# Patient Record
Sex: Male | Born: 1950 | Race: White | Hispanic: No | Marital: Married | Smoking: Former smoker
Health system: Southern US, Community
[De-identification: ages and names within clinical notes are randomized; demographics above are authoritative.]

## PROBLEM LIST (undated history)

## (undated) DIAGNOSIS — K449 Diaphragmatic hernia without obstruction or gangrene: Secondary | ICD-10-CM

## (undated) DIAGNOSIS — F32A Depression, unspecified: Secondary | ICD-10-CM

## (undated) DIAGNOSIS — C8590 Non-Hodgkin lymphoma, unspecified, unspecified site: Secondary | ICD-10-CM

## (undated) DIAGNOSIS — E119 Type 2 diabetes mellitus without complications: Secondary | ICD-10-CM

## (undated) DIAGNOSIS — I441 Atrioventricular block, second degree: Secondary | ICD-10-CM

## (undated) DIAGNOSIS — I509 Heart failure, unspecified: Secondary | ICD-10-CM

## (undated) DIAGNOSIS — F329 Major depressive disorder, single episode, unspecified: Secondary | ICD-10-CM

## (undated) DIAGNOSIS — N183 Chronic kidney disease, stage 3 unspecified: Secondary | ICD-10-CM

## (undated) DIAGNOSIS — E782 Mixed hyperlipidemia: Secondary | ICD-10-CM

## (undated) DIAGNOSIS — C61 Malignant neoplasm of prostate: Secondary | ICD-10-CM

## (undated) DIAGNOSIS — R0981 Nasal congestion: Secondary | ICD-10-CM

## (undated) DIAGNOSIS — S31109A Unspecified open wound of abdominal wall, unspecified quadrant without penetration into peritoneal cavity, initial encounter: Secondary | ICD-10-CM

## (undated) DIAGNOSIS — C801 Malignant (primary) neoplasm, unspecified: Secondary | ICD-10-CM

## (undated) DIAGNOSIS — I4892 Unspecified atrial flutter: Principal | ICD-10-CM

## (undated) DIAGNOSIS — I2510 Atherosclerotic heart disease of native coronary artery without angina pectoris: Secondary | ICD-10-CM

## (undated) DIAGNOSIS — R0989 Other specified symptoms and signs involving the circulatory and respiratory systems: Secondary | ICD-10-CM

## (undated) DIAGNOSIS — N179 Acute kidney failure, unspecified: Secondary | ICD-10-CM

## (undated) DIAGNOSIS — I4891 Unspecified atrial fibrillation: Secondary | ICD-10-CM

## (undated) DIAGNOSIS — C679 Malignant neoplasm of bladder, unspecified: Secondary | ICD-10-CM

## (undated) DIAGNOSIS — N289 Disorder of kidney and ureter, unspecified: Secondary | ICD-10-CM

## (undated) DIAGNOSIS — K439 Ventral hernia without obstruction or gangrene: Secondary | ICD-10-CM

## (undated) DIAGNOSIS — F419 Anxiety disorder, unspecified: Secondary | ICD-10-CM

## (undated) HISTORY — PX: CARDIAC CATHETERIZATION: SHX172

## (undated) HISTORY — DX: Other specified symptoms and signs involving the circulatory and respiratory systems: R09.89

## (undated) HISTORY — DX: Atrioventricular block, second degree: I44.1

## (undated) HISTORY — PX: PROSTATE SURGERY: SHX751

## (undated) HISTORY — DX: Unspecified atrial fibrillation: I48.91

## (undated) HISTORY — DX: Unspecified atrial flutter: I48.92

## (undated) HISTORY — PX: KIDNEY SURGERY: SHX687

## (undated) HISTORY — DX: Mixed hyperlipidemia: E78.2

## (undated) HISTORY — PX: BLADDER SURGERY: SHX569

## (undated) HISTORY — PX: PORTACATH PLACEMENT: SHX2246

## (undated) HISTORY — PX: CHOLECYSTECTOMY: SHX55

## (undated) HISTORY — DX: Malignant neoplasm of bladder, unspecified: C67.9

## (undated) HISTORY — DX: Malignant neoplasm of prostate: C61

## (undated) HISTORY — DX: Ventral hernia without obstruction or gangrene: K43.9

## (undated) HISTORY — DX: Non-Hodgkin lymphoma, unspecified, unspecified site: C85.90

## (undated) HISTORY — DX: Type 2 diabetes mellitus without complications: E11.9

## (undated) HISTORY — PX: HERNIA REPAIR: SHX51

## (undated) HISTORY — PX: CORONARY ARTERY BYPASS GRAFT: SHX141

## (undated) HISTORY — PX: VEIN SURGERY: SHX48

---

## 2000-07-19 ENCOUNTER — Encounter: Admission: RE | Admit: 2000-07-19 | Discharge: 2000-07-21 | Payer: Self-pay | Admitting: *Deleted

## 2004-09-03 ENCOUNTER — Ambulatory Visit: Payer: Self-pay | Admitting: Cardiology

## 2006-04-15 ENCOUNTER — Ambulatory Visit (HOSPITAL_COMMUNITY): Admission: RE | Admit: 2006-04-15 | Discharge: 2006-04-15 | Payer: Self-pay | Admitting: Family Medicine

## 2006-07-06 ENCOUNTER — Ambulatory Visit: Payer: Self-pay | Admitting: Gastroenterology

## 2006-07-09 ENCOUNTER — Inpatient Hospital Stay (HOSPITAL_COMMUNITY): Admission: EM | Admit: 2006-07-09 | Discharge: 2006-07-20 | Payer: Self-pay | Admitting: Internal Medicine

## 2006-07-09 ENCOUNTER — Ambulatory Visit: Payer: Self-pay | Admitting: *Deleted

## 2006-07-11 ENCOUNTER — Encounter: Payer: Self-pay | Admitting: Vascular Surgery

## 2006-07-27 ENCOUNTER — Ambulatory Visit: Payer: Self-pay | Admitting: Cardiovascular Disease

## 2006-07-27 ENCOUNTER — Ambulatory Visit (HOSPITAL_COMMUNITY): Admission: RE | Admit: 2006-07-27 | Discharge: 2006-07-27 | Payer: Self-pay | Admitting: Cardiovascular Disease

## 2006-08-06 ENCOUNTER — Emergency Department (HOSPITAL_COMMUNITY): Admission: EM | Admit: 2006-08-06 | Discharge: 2006-08-06 | Payer: Self-pay | Admitting: Emergency Medicine

## 2006-08-09 ENCOUNTER — Inpatient Hospital Stay (HOSPITAL_COMMUNITY): Admission: AD | Admit: 2006-08-09 | Discharge: 2006-08-16 | Payer: Self-pay | Admitting: Surgery

## 2006-08-12 ENCOUNTER — Ambulatory Visit: Payer: Self-pay | Admitting: Surgery

## 2006-08-30 ENCOUNTER — Ambulatory Visit: Payer: Self-pay | Admitting: Surgery

## 2006-08-30 ENCOUNTER — Encounter: Admission: RE | Admit: 2006-08-30 | Discharge: 2006-08-30 | Payer: Self-pay | Admitting: Surgery

## 2006-09-09 ENCOUNTER — Ambulatory Visit: Payer: Self-pay | Admitting: Cardiothoracic Surgery

## 2006-09-20 ENCOUNTER — Ambulatory Visit: Payer: Self-pay | Admitting: Cardiovascular Disease

## 2006-09-27 ENCOUNTER — Ambulatory Visit: Payer: Self-pay | Admitting: Cardiothoracic Surgery

## 2006-10-04 ENCOUNTER — Ambulatory Visit: Payer: Self-pay | Admitting: Surgery

## 2006-10-25 ENCOUNTER — Ambulatory Visit: Payer: Self-pay | Admitting: Surgery

## 2007-08-30 ENCOUNTER — Encounter (HOSPITAL_BASED_OUTPATIENT_CLINIC_OR_DEPARTMENT_OTHER): Payer: Self-pay | Admitting: Internal Medicine

## 2007-11-28 ENCOUNTER — Ambulatory Visit: Payer: Self-pay | Admitting: Surgery

## 2007-11-29 ENCOUNTER — Ambulatory Visit: Payer: Self-pay | Admitting: Cardiovascular Disease

## 2007-11-30 ENCOUNTER — Ambulatory Visit: Payer: Self-pay | Admitting: Cardiovascular Disease

## 2007-11-30 ENCOUNTER — Ambulatory Visit: Payer: Self-pay

## 2007-12-05 ENCOUNTER — Ambulatory Visit: Payer: Self-pay

## 2007-12-29 ENCOUNTER — Encounter: Admission: RE | Admit: 2007-12-29 | Discharge: 2007-12-29 | Payer: Self-pay | Admitting: General Surgery

## 2008-01-08 ENCOUNTER — Ambulatory Visit: Payer: Self-pay | Admitting: Cardiovascular Disease

## 2008-03-05 ENCOUNTER — Inpatient Hospital Stay (HOSPITAL_COMMUNITY): Admission: RE | Admit: 2008-03-05 | Discharge: 2008-03-07 | Payer: Self-pay | Admitting: General Surgery

## 2008-10-22 ENCOUNTER — Encounter: Payer: Self-pay | Admitting: Cardiovascular Disease

## 2008-10-22 ENCOUNTER — Ambulatory Visit: Payer: Self-pay | Admitting: Cardiovascular Disease

## 2008-10-22 DIAGNOSIS — I441 Atrioventricular block, second degree: Secondary | ICD-10-CM | POA: Insufficient documentation

## 2008-10-22 DIAGNOSIS — E785 Hyperlipidemia, unspecified: Secondary | ICD-10-CM

## 2008-10-22 DIAGNOSIS — K439 Ventral hernia without obstruction or gangrene: Secondary | ICD-10-CM | POA: Insufficient documentation

## 2008-10-22 DIAGNOSIS — E119 Type 2 diabetes mellitus without complications: Secondary | ICD-10-CM | POA: Insufficient documentation

## 2008-10-22 DIAGNOSIS — R0989 Other specified symptoms and signs involving the circulatory and respiratory systems: Secondary | ICD-10-CM | POA: Insufficient documentation

## 2008-10-22 DIAGNOSIS — Z951 Presence of aortocoronary bypass graft: Secondary | ICD-10-CM

## 2008-10-22 DIAGNOSIS — F17200 Nicotine dependence, unspecified, uncomplicated: Secondary | ICD-10-CM

## 2008-11-08 ENCOUNTER — Encounter: Payer: Self-pay | Admitting: Cardiovascular Disease

## 2008-11-08 ENCOUNTER — Ambulatory Visit: Payer: Self-pay

## 2009-03-20 ENCOUNTER — Encounter (INDEPENDENT_AMBULATORY_CARE_PROVIDER_SITE_OTHER): Payer: Self-pay | Admitting: *Deleted

## 2009-04-24 ENCOUNTER — Ambulatory Visit: Payer: Self-pay | Admitting: Gastroenterology

## 2009-05-01 ENCOUNTER — Telehealth: Payer: Self-pay | Admitting: Gastroenterology

## 2009-05-07 ENCOUNTER — Ambulatory Visit: Payer: Self-pay | Admitting: Gastroenterology

## 2009-09-04 ENCOUNTER — Telehealth (INDEPENDENT_AMBULATORY_CARE_PROVIDER_SITE_OTHER): Payer: Self-pay | Admitting: *Deleted

## 2009-12-10 ENCOUNTER — Ambulatory Visit: Payer: Self-pay | Admitting: Vascular Surgery

## 2009-12-15 ENCOUNTER — Ambulatory Visit: Payer: Self-pay | Admitting: Vascular Surgery

## 2010-05-20 ENCOUNTER — Ambulatory Visit: Payer: Self-pay | Admitting: Vascular Surgery

## 2010-07-03 ENCOUNTER — Encounter: Payer: Self-pay | Admitting: Physician Assistant

## 2010-07-03 ENCOUNTER — Ambulatory Visit: Payer: Self-pay | Admitting: Cardiology

## 2010-08-11 NOTE — Assessment & Plan Note (Signed)
Summary: [facility]   CC:  no complaints folllow up.  History of Present Illness: Frederick Foster returns today for followup.he is a previous patient of Dr. Samule Ohm.  He had coronary bypass surgery by Dr. Lavinia Sharps in 2009.  this was complicated by sternal wound infection.  He just had his ventral hernia repair by Dr. Carolynne Edouard.  He is still bothered by it.  His current disability.  He used to work as a Glass blower/designer.  he has not had any significant chest pain.  He is seen Westen rocking in family practice for cardiac rehabilitation-type issues.  He has had his Lipitor stopped due to muscle pains he continues to smoke a pack a day or so.  He has been on Chantix in the past because nightmares.  I counseled him for less than 10 minutes regarding smoking cessation.  He needs a followup carotid duplex her right bruit.  Current Problems (verified): None  Current Medications (verified): 1)  Aspirin Ec 325 Mg Tbec (Aspirin) .... Take One Tablet By Mouth Daily 2)  Pravastatin Sodium 40 Mg Tabs (Pravastatin Sodium) .Marland Kitchen.. 1 Tab By Mouth Once Daily 3)  Cardizem Cd 240 Mg Xr24h-Cap (Diltiazem Hcl Coated Beads) .Marland Kitchen.. 1 Tab By Mouth Once Daily 4)  Xanax 25mg  .... As Needed 5)  Lisionpril .Marland Kitchen.. 1 Tab By Mouth Once Daily 6)  Citalopram .... 1 Tab By Mouth Once Daily  Allergies (verified): 1)  ! Pcn 2)  ! Sulfa  Family History:    non-contributory  Social History:    Married    On Disability    Use to work as a Biochemist, clinical    Non-drinker  Review of Systems       Denies fever, malais, weight loss, blurry vision, decreased visual acuity, cough, sputum, SOB, hemoptysis, pleuritic pain, palpitaitons, heartburn, abdominal pain, melena, lower extremity edema, claudication, or rash. Muscle aches and pains ? secondary to statin  Vital Signs:  Patient profile:   60 year old male Height:      72 inches Weight:      231 pounds Pulse rate:   93 / minute Resp:     12 per minute BP sitting:   146 / 88  (left  arm)  Vitals Entered By: Kem Parkinson (October 22, 2008 8:40 AM)  Physical Exam  General:  Affect appropriate Healthy:  appears stated age HEENT: normal Neck supple with no adenopathy JVP normal Right  bruits no thyromegaly Lungs clear with no wheezing and good diaphragmatic motion Heart:  S1/S2 no murmur,rub, gallop or click PMI normal Abdomen: benighn, BS positve, no tenderness, no AAA no bruit.  No HSM or HJR Distal pulses intact with no bruits No edema Neuro non-focal Skin warm and dry Previous sternal woulnd infection Left radial arm scar   Impression & Recommendations:  Problem # 1:  CORONARY ARTERY BYPASS GRAFT, TWO VESSEL, HX OF (ICD-V45.81) Stable no angina continue ASA  Problem # 2:  SMOKER (ICD-305.1) F/U primary M.D> consider patch or Welbutrin.  Counseled for less than 10 minutes  Problem # 3:  AODM (ICD-250.00) Target A1c 6.5 or less His updated medication list for this problem includes:    Aspirin Ec 325 Mg Tbec (Aspirin) .Marland Kitchen... Take one tablet by mouth daily    Januvia 100 Mg Tabs (Sitagliptin phosphate) .Marland Kitchen... 1 tab by mouth once daily    Avandamet 10-998 Mg Tabs (Rosiglitazone-metformin) .Marland Kitchen... 1 tab by mouth two times a day  Problem # 4:  CAROTID BRUIT (ICD-785.9)  F/U duplex No high grade lesion pre CABG  Problem # 5:  MIXED HYPERLIPIDEMIA (ICD-272.2) Lipitor stopped due to muscle pain.  Primary to follow CPD His updated medication list for this problem includes:    Pravastatin Sodium 40 Mg Tabs (Pravastatin sodium) .Marland Kitchen... 1 tab by mouth once daily  Problem # 6:  HERNIA, VENTRAL (ICD-553.20) F/U Dr/ Carolynne Edouard.  Still bothering patient but no intestinal obstruction  Patient Instructions: 1)  Carotid Duplex 2)  F/U Dr. Eden Emms 6 months

## 2010-08-11 NOTE — Progress Notes (Signed)
  Recieved request for Records from [employer] forwarded to [facility] for processing [facility]  September 04, 2009 9:06 AM    Appended Document:  Recieved request from [employer] forwarded to [facility] for processing'  Appended Document:  Recieved a 3rd request from [employer] for Records forwarded to [facility] for processing.Frederick Foster

## 2010-08-13 NOTE — Assessment & Plan Note (Signed)
Summary: sur/removal left kidney on 07/09/10   Visit Type:  surgical clearance  CC:  no complaints.  History of Present Illness: Primary Cardiologist:  Dr. Charlton Haws  Frederick Foster is a 60 -year-old male with a history of CAD, status post CABG in 2008 which was complicated by a sternal wound infection, diabetes, hyperlipidemia.  He has a history of 21 AV block in the setting of Cardizem and Metoprolol which have been held.  He needs a left nephrectomy and presents for preoperative evaluation.  He has renal carcinoma.  His surgery is set for December 29.  His surgeon is Dr. Beatris Ship in [place].  He denies chest pain, shortness of breath, syncope.  He denies palpitations.  He denies orthopnea, PND or pedal edema.  He has a shop at his home and he walks to it several times a day to do work.  He has to walk up and down a large hill.  He denies chest symptoms with this.  He can vacuum his whole house without chest symptoms or shortness of breath.  Problems Prior to Update: 1)  Carotid Bruit  (ICD-785.9) 2)  Hernia, Ventral  (ICD-553.20) 3)  Aodm  (ICD-250.00) 4)  Smoker  (ICD-305.1) 5)  Mixed Hyperlipidemia  (ICD-272.2) 6)  Av Block  (ICD-426.9) 7)  Coronary Artery Bypass Graft, Two Vessel, Hx of  (ICD-V45.81)  Current Medications (verified): 1)  Aspirin Ec 325 Mg Tbec (Aspirin) .... (Hold)    Take One Tablet By Mouth Daily 2)  Pravastatin Sodium 40 Mg Tabs (Pravastatin Sodium) .Marland Kitchen.. 1 Tab By Mouth Once Daily 3)  Cardizem Cd 240 Mg Xr24h-Cap (Diltiazem Hcl Coated Beads) .Marland Kitchen.. 1 Tab By Mouth Once Daily 4)  Xanax 25mg  .... As Needed 5)  Lisionpril .Marland Kitchen.. 1 Tab By Mouth Once Daily 6)  Janumet 50-1000 Mg Tabs (Sitagliptin-Metformin Hcl) .Marland Kitchen.. 1 Tablet Two Times A Day 7)  Lantus Solostar 100 Unit/ml Soln (Insulin Glargine) .... 30 Units At Bedtime  Allergies (verified): 1)  ! Pcn 2)  ! Sulfa  Past History:  Past Medical History: Last updated: 10/22/2008 CABG: [place] 07/2006 Median  sternotomy, extracorporeal circulation,  coronary bypass graft surgery x3 using a left internal mammary artery  graft to the obtuse marginal branch of the left circumflex coronary  artery, and a left radial artery graft to the posterior descending  branch of the right coronary, and a right internal mammary artery graft  to the left anterior descending coronary artery. 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 . . plans to quit soon Non-drinker  Review of Systems       He has had hematuria.  Otherwise, as per  the HPI.  All other systems reviewed and negative.   Vital Signs:  Patient profile:   60 year old male Height:      72 inches Weight:      231.50 pounds BMI:     31.51 Pulse rate:   88 / minute BP sitting:   142 / 79  (left arm) Cuff size:   regular  Vitals Entered By: Caralee Ates CMA (July 03, 2010 12:41 PM)  Physical Exam  General:  Well nourished, well developed, in no acute distress HEENT: normal Neck: no JVD Cardiac:  normal S1, S2; RRR; no murmur Lungs:  clear to auscultation bilaterally, no wheezing, rhonchi or rales Abd: soft, nontender,  no hepatomegaly Ext: no clubbing, cyanosis or edema Endo: no thyromegaly Skin: warm and dry Neuro:  CNs 2-12 intact, no focal abnormalities noted    EKG  Procedure date:  07/03/2010  Findings:      Normal Sinus Rhythm Heart rate 76 Normal axis Nonconducted PAC Nonspecific interventricular conduction delay No ischemic changes no old tracing to compare to  Impression & Recommendations:  Problem # 1:  PRE-OPERATIVE CARDIOVASCULAR EXAMINATION (ICD-V72.81)  He does not have any unstable cardiac conditions.  He had revascularization surgery in 2008.  He had a nonischemic Myoview study in 2009.  He is able to achieve well over 4 minutes  without chest pain or shortness of breath.  His rhythm is stable on EKG.  He denies any symptoms of heart failure.  According to ACC/AHA guidelines, he requires no further cardiac workup prior to his noncardiac surgery.  He should be at acceptable risk.  Our team in [place] will be available in the perioperative period as necessary.  He should resume aspirin once it is deemed to be safe post operatively.  He has been off of beta blocker therapy since he developed 2:1 heart block 2 years ago.  I would not recommend restarting at this time.  Problem # 2:  CORONARY ARTERY BYPASS GRAFT, TWO VESSEL, HX OF (ICD-V45.81) Stable.  He denies angina.  He should resume aspirin postoperatively once felt to be stable.  Problem # 3:  CAROTID BRUIT (ICD-785.9) Carotid Dopplers done in April 2010 demonstrated 0-39% bilateral ICA stenosis.  He requires followup in 2012.  Problem # 4:  MIXED HYPERLIPIDEMIA (ICD-272.2) This is followed by his PCP.  His updated medication list for this problem includes:    Pravastatin Sodium 40 Mg Tabs (Pravastatin sodium) .Marland Kitchen... 1 tab by mouth once daily  Other Orders: EKG w/ Interpretation (93000)  Patient Instructions: 1)  Your physician recommends that you schedule a follow-up appointment in: 6 MONTHS WITH DR. Eden Emms. 2)  Your physician recommends that you continue on your current medications as directed. Please refer to the Current Medication list given to you today.

## 2010-09-02 ENCOUNTER — Other Ambulatory Visit (INDEPENDENT_AMBULATORY_CARE_PROVIDER_SITE_OTHER): Payer: Medicare Other | Admitting: Vascular Surgery

## 2010-09-02 DIAGNOSIS — I83893 Varicose veins of bilateral lower extremities with other complications: Secondary | ICD-10-CM

## 2010-09-03 NOTE — Assessment & Plan Note (Signed)
OFFICE VISIT  [name], Frederick Foster DOB:  1951-02-15                                       09/02/2010 CHART#:[id number]  The patient presents today for laser ablation of left great saphenous vein from the level of the knee up to just below the saphenofemoral junction.  This was done under tumescent anesthesia in our office.  He had no immediate complications and was discharged to home, and he will be seen again in 1 week for repeat duplex followup.    Larina Earthly, M.D. Electronically Signed  TFE/MEDQ  D:  09/02/2010  T:  09/03/2010  Job:  [id number]

## 2010-09-09 ENCOUNTER — Ambulatory Visit (INDEPENDENT_AMBULATORY_CARE_PROVIDER_SITE_OTHER): Payer: Medicare Other | Admitting: Vascular Surgery

## 2010-09-09 ENCOUNTER — Encounter (INDEPENDENT_AMBULATORY_CARE_PROVIDER_SITE_OTHER): Payer: Medicare Other

## 2010-09-09 DIAGNOSIS — I83893 Varicose veins of bilateral lower extremities with other complications: Secondary | ICD-10-CM

## 2010-09-09 DIAGNOSIS — Z48812 Encounter for surgical aftercare following surgery on the circulatory system: Secondary | ICD-10-CM

## 2010-09-10 NOTE — Assessment & Plan Note (Signed)
OFFICE VISIT  Frederick Foster, Frederick Foster DOB:  01-09-1951                                       09/09/2010 CHART#:[id number]  The patient presents today for a 1-week follow-up after laser ablation of his left great saphenous vein.  He had no significant difficulty, minimal bruising and mild tenderness.  He does have a palpable saphenous vein in the subcutaneous tissue to his medial thigh.  He does have decompression and very extreme varicosity ver his anterior knee, thigh and calf area.  He underwent repeat venous duplex which shows closure of his great saphenous vein and no evidence of DVT.  He will continue compression garment usage.  We will see him again in 3 months to determine if he has had successful decompression and treatment of his large varicosities.  He understands that he may require a staged second procedure if he continues to have difficulty despite ablation of his great saphenous vein.    Larina Earthly, M.D. Electronically Signed  TFE/MEDQ  D:  09/09/2010  T:  09/10/2010  Job:  [id number]

## 2010-09-15 NOTE — Procedures (Unsigned)
DUPLEX DEEP VENOUS EXAM - LOWER EXTREMITY  INDICATION:  Status post left greater saphenous vein ablation.  HISTORY:  Edema:  No. Trauma/Surgery:  Yes. Pain:  Yes. PE:  No. Previous DVT:  No. Anticoagulants:  No. Other:  DUPLEX EXAM:               CFV   SFV   PopV  PTV    GSV               R  L  R  L  R  L  R   L  R  L Thrombosis    o  o     o     o      o     + Spontaneous   +  +     +     +      +     o Phasic        +  +     +     +      +     o Augmentation  +  +     +     +      +     o Compressible  +  +     +     +      +     o Competent     +  +     +     +      +     +  Legend:  + - yes  o - no  p - partial  D - decreased  IMPRESSION: 1. No evidence of acute deep venous thrombosis within the left lower     extremity. 2. Successful left greater saphenous vein ablation from the     saphenofemoral junction to the distal insertion site.   _____________________________ Larina Earthly, M.D.  OD/MEDQ  D:  09/09/2010  T:  09/09/2010  Job:  [id number]

## 2010-10-15 LAB — GLUCOSE, CAPILLARY
Glucose-Capillary: 185 mg/dL — ABNORMAL HIGH (ref 70–99)
Glucose-Capillary: 188 mg/dL — ABNORMAL HIGH (ref 70–99)

## 2010-11-24 NOTE — Procedures (Signed)
[facility]                              EXERCISE TREADMILL   KIZER, NOBBE                      MRN:          [id number]  DATE:11/30/2007                            DOB:          04/04/1951    A 56-year patient with previous history of CABG showed up to the office  with 2:1 AV block on Cardizem and Toprol.  Medicines were held.  Treadmill test was done to assess chronotropic incompetence and adequacy  of sinus and AV node function.   The patient exercised for 4 minutes, the equivalent of 2 minutes into  stage II of the Bruce protocol.  Exercise was stopped due to fatigue and  shortness of breath.  Heart rate increased from 90-151.   The patient's resting EKG was normal at this time.  There was no AV  block, no first-degree block.   With stress, he did not drop any beats.  There were no significant  arrhythmias.  He had an occasional PVC.  Baseline EKG had nonspecific ST-  T wave changes and was thought nondiagnostic in regards to ischemia.   IMPRESSION:  Resolution of 2:1 block.  The patient will have his  Cardizem and Toprol stopped.  Given the fact that he is a diabetic with  hypertension, we will start him on 10 of lisinopril.  He still needs  further evaluation in regards to his previous CABG.  Particularly since  he is smoking and has had a sternal wound infection, I am worried about  early graft failure.  He will follow up with a dobutamine or adenosine  Myoview study for this.   There are currently no indication for pacemaker therapy.     Noralyn Pick. Eden Emms, MD, [facility]  Electronically Signed    PCN/MedQ  DD: 11/30/2007  DT: 11/30/2007  Job #: [id number]

## 2010-11-24 NOTE — Op Note (Signed)
NAMETHOMAS, Foster               ACCOUNT NO.:  [id number]   MEDICAL RECORD NO.:  [id number]          PATIENT TYPE:  INP   LOCATION:  5151                         FACILITY:  [facility]   PHYSICIAN:  Ollen Gross. Vernell Morgans, M.D. DATE OF BIRTH:  1951/03/01   DATE OF PROCEDURE:  03/05/2008  DATE OF DISCHARGE:                               OPERATIVE REPORT   PREOPERATIVE DIAGNOSIS:  Ventral hernia.   POSTOPERATIVE DIAGNOSIS:  Ventral hernia.   PROCEDURES:  Ventral hernia repair with mesh.   SURGEON:  Ollen Gross. Vernell Morgans, MD   ASSISTANT:  Dr. Donell Beers   ANESTHESIA:  General endotracheal.   PROCEDURE:  After informed consent was obtained, the patient was brought  to the operating room and placed in supine position on the operating  room table.  After adequate induction of general anesthesia, the  patient's abdomen was prepped with Betadine and draped in usual sterile  manner.  The patient had an upper epigastric ventral hernia after a  coronary artery bypass grafting procedure at upper midline.  An incision  was made with 10-blade knife.  This incision was carried down through  the skin and subcutaneous tissue sharply with the electrocautery until  the linea alba was identified.  The linea alba was incised with the  electrocautery.  The preperitoneal space was then dissected bluntly with  a hemostat until the peritoneum was identified.  The peritoneum was  opened with the electrocautery, gaining access to the abdominal cavity.  The rest of the incision was then opened under direct vision.  There  were no adhesions to the upper abdomen.  The falciform ligament was  taken down by clamping it proximally and distally with Kelly clamp,  divided and ligated with 2-0 silk ties.  The hernia sac itself was  excised sharply with the electrocautery.  Once this was accomplished,  the fascial edges were identified and appeared to be healthy.  A 10 x 15  piece of Proceed mesh was then chosen.  The mesh was  oriented with the  blue side towards the ceiling.  The mesh was placed in the abdominal  cavity beneath the hernia defect and wound.  The mesh was then anchored  to the inside of the abdominal wall with multiple interrupted #1 Novofil  stitches that were brought through the fascia of the abdominal wall then  down through the mesh and then back up through the abdominal wall again.  This was done circumferentially, so there were no real gaps in the mesh.  Once in place, care was taken to make sure there were no visceral  contents stuck above any edge of the mesh.  Each of the stitches were  cinched down and tied.  Once this was accomplished, the mesh was in good  position completely covering the fascial defect with good overlap.  Superiorly, the xiphoid process had been split by the CABG and part of  the xiphoid process was sticking out anteriorly.  This was shaved off  with the electrocautery.  The wound was irrigated with copious amounts  of saline.  The fascia was then  closed over the mesh with interrupted #1  Novofil figure-of-eight stitches.  Superiorly, the mesh would not  completely come together near the xiphoid, but some of the subcutaneous  fatty tissue was able to be closed over this area of the defect.  The  wound was then irrigated with more saline.  The subcutaneous tissue was  then closed with a running 2-0 Vicryl stitch anchoring it down to the  fascia of the abdominal wall as well.  The skin  was then closed with staples.  Betadine ointment and sterile dressings  were applied.  The patient tolerated procedure well.  At the end of the  case, all needle, sponge, and instrument counts were correct.  The  patient was then awakened and taken to recovery room in stable  condition.       Ollen Gross. Vernell Morgans, M.D.  Electronically Signed     PST/MEDQ  D:  03/05/2008  T:  03/06/2008  Job:  [id number]

## 2010-11-24 NOTE — Assessment & Plan Note (Signed)
[facility]                            CARDIOLOGY OFFICE NOTE   Frederick Foster, Frederick Foster                      MRN:          [id number]  DATE:01/08/2008                            DOB:          Jun 29, 1951    Frederick Foster returns today for followup.   I had initially seen him in May 2009 at which point I had picked him up  as a new patient from Dr. Samule Ohm.  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   He needed a preop clearance for an abdominal hernia surgery.  Apparently, this is going to be done by Dr. Carolynne Edouard now.  When I saw him on  Dec 07, 2007, he had asymptomatic heart block, which was 3:1 with a  residual heart rate of 52.   As indicated, this was asymptomatic.  We stopped his Lopressor and  Cardizem.  He had a stress Myoview study.  He had an exercise  electrocardiogram, which showed no heart block, a week later with good  heart rate response.  Subsequently, he had a Myoview study, which showed  no evidence of ischemia and an EF of 54%.  I told Frederick Foster that I would  clear him for surgery.  His heart block was asymptomatic and seems to  have resolved of calcium blockers.  He is now only on metoprolol 25  b.i.d.  He is not having any symptoms of syncope.  His Myoview was low-  risk.  He is very anxious about his money.  He has not worked in a  month.  He needs to get some disability forms regarding his hernia,  since it is quite painful.  I told him as far as I was concerned, he  could schedule the surgery any time and we would like to follow his  rhythm in the periop phase.   PHYSICAL EXAMINATION:  VITAL SIGNS:  Today, his heart rate was actually  elevated at 89 and sinus rhythm, blood pressure is 140/80, weight 224,  afebrile, and respiratory rate 14.  HEENT:  Unremarkable.  Carotids normal without bruit.  No  lymphadenopathy, thyromegaly, or JVP elevation.  LUNGS:  Clear  diaphragmatic motion.  No wheezing.  S1 and S2.  Normal  heart sounds.  Sternum is healed.  However, he has a large ventral  hernia just below the manubrium.  ABDOMEN:  Benign.  Bowel sounds positive.  No AAA.  No tenderness.  No  bruit.  No hepatosplenomegaly.  No hepatojugular reflux.  EXTREMITIES:  Distal pulses are intact.  No edema.  NEURO:  Nonfocal.  SKIN:  Warm and dry.  No muscular weakness.   EKG shows sinus rhythm at a rate of 94, PR interval is 154 milliseconds,  QRS is 90 milliseconds, and QT interval was 340.  He has left axis  deviation with a previous inferior wall MI.   As indicated, his Myoview was reviewed. It is low-risk with probable  previous small inferior wall infarction, no ischemia, EF 54%.   IMPRESSION:  1. Clear for hernia surgery  with Dr. Carolynne Edouard. Continue to hold calcium      blocker and do not increase beta-blocker in the future until      cleared by me given his asymptomatic heart block.  2. Coronary disease, low-risk Myoview, not having chest pain.      Continue aspirin therapy with low-dose beta blocker.  3. Diabetes, will likely need sliding-scale and medical consultation      to follow his diabetes.  The patient does not follow it at home      since it is too expensive.  He will continue his Avandamet.  4. History of hyperlipidemia in the setting of coronary artery      disease.  Continue Lipitor 80 a day, lipid and liver profile in 6      months.  5. We will follow the patient in the periop period and then I will see      him in 6 months after hospital discharge.     Noralyn Pick. Eden Emms, MD, [facility]  Electronically Signed    PCN/MedQ  DD: 01/08/2008  DT: 01/09/2008  Job #: [id number]   cc:   Dr. Carolynne Edouard

## 2010-11-24 NOTE — Consult Note (Signed)
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  He is an active healthy 60-  year-old gentleman who reports the onset of this around the time of  trauma to his left leg in the mid 1980s.  He has had marked progression  over this period of time and has extensive very large varicosities from  his thigh onto his knee area and calf onto his ankles.  He denies any  history of deep venous thrombosis or bleeding.  He reports he is having  increasingly severe discomfort related to this both specifically over  these large varicosities themselves which appear longstanding and also  with just generalized aching sensation after prolonged standing.  He has  not worn compression garments.  He does report some relief with  elevation.   PAST MEDICAL HISTORY:  Significant for hypertension, insulin dependent  for many years.  He did have a prior myocardial infarction in 2008 and  underwent coronary artery bypass graft with radial artery harvest at  that time.   FAMILY HISTORY:  Negative for premature atherosclerotic disease.   SOCIAL HISTORY:  He is married with 4 children.  He is disabled.  He  does smoke 1 pack of cigarettes per day.  He does not drink alcohol on a  regular basis.   REVIEW OF SYSTEMS:  Positive for no weight loss or weight gain.  He  reports that his weight is 235 pounds.  He is 6 feet 1 inch tall.  Aside from the prior  cardiac issue denies any GI, pulmonary, or GU  difficulty.  He does report pain in his legs with walking.  Neurologic, musculoskeletal, psychiatric, hematologic and skin are  negative.   PHYSICAL EXAMINATION:  Well developed, well nourished gentleman  appearing his stated age of 71.  Vital signs:  His blood pressure is  150/89, pulse 92, respirations  18.  He is in no acute distress.  HEENT:  Normal.  Abdomen:  Soft, nontender.  No masses.  Musculoskeletal:  Shows  no major deformities or cyanosis.  He does have very large varicosities  extending from his medial thigh in the midportion across the anterior  knee and extensively throughout his calf on the medial and lateral  aspect.  Neurologic:  No focal weakness or paresthesias.  Skin:  No  ulcers or rashes.  His pulse status is normal with 2+ dorsalis pedis and  2+ radial pulses bilaterally.   He underwent noninvasive vascular studies in our office and I have  reviewed this with him.  This shows marked reflux throughout his left  great saphenous vein.  He has no evidence of reflux in the small  saphenous vein.  He has very large varicosities arising off the  saphenous vein which is incompetent.  I discussed the significance of  this with the patient.  I explained that since he has mild insufficiency  in the deep system that he should have excellent outcome  with ablation  of his saphenous vein and tributary and phlebectomy of his multiple  large varicosities.  He has not worn compression garments.  We have  fitted him today for thigh  high 20 and 30 mmHg compression garments and  instructed him on his daily use of these.  I also explained the  importance of elevation when possible and ibuprofen for discomfort.  We  will see him again in 3 months for continued discussion.  I do feel that  he is an outstanding candidate for ablation of the greater saphenous  vein and stab phlebectomy should he fail conservative treatment.     Larina Earthly, M.Foster.  Electronically Signed   TFE/MEDQ  Foster:  12/10/2009  T:  12/11/2009  Job:  [id number]   cc:   Ernestina Penna, M.Foster.

## 2010-11-24 NOTE — Procedures (Signed)
LOWER EXTREMITY VENOUS REFLUX EXAM   INDICATION:  Bulging varicose veins.   EXAM:  Using color-flow imaging and pulse Doppler spectral analysis, the  left common femoral, superficial femoral, popliteal, posterior tibial,  greater and lesser saphenous veins are evaluated.  There is no evidence  suggesting deep venous insufficiency in the left lower extremity.   The left saphenofemoral junction is not competent with reflux of >500  milliseconds. The left GSV is not competent with reflux of >500  milliseconds with the caliber as described below.   The left proximal short saphenous vein demonstrates competency.   GSV Diameter (used if found to be incompetent only)                                            Right    Left  Proximal Greater Saphenous Vein           cm       0.88 cm  Proximal-to-mid-thigh                     cm       0.78 cm  Mid thigh                                 cm       0.71 cm  Mid-distal thigh                          cm       0.65 cm  Distal thigh                              cm       0.51 cm  Knee                                      cm       0.35 cm    IMPRESSION:  1. Left greater saphenous vein reflux with >500 milliseconds is      identified with the caliber ranging from 0.88 cm to 0.35 cm knee to      groin.  2. The left greater saphenous vein is not aneurysmal.  3. The left greater saphenous vein is not tortuous.  4. The deep venous system is competent.  5. The left lesser saphenous vein is competent.        ___________________________________________  Larina Earthly, M.D.   NT/MEDQ  D:  12/10/2009  T:  12/10/2009  Job:  [id number]

## 2010-11-24 NOTE — Assessment & Plan Note (Signed)
OFFICE VISIT   Frederick Foster, Frederick Foster  DOB:  [birth year]                                        Nov 28, 2007  CHART #:  [id number]   The patient returned today for followup of a ventral incisional hernia  at the lower end of his sternotomy incision and the epigastrium.  To  summarize, he is a 60 year old gentleman who underwent coronary bypass  x3 on 07/15/2006.  He had bilateral internal mammary grafts and a left  radial artery graft due to having no saphenous vein grafts from severe  lower extremity varicose vein disease.  His postoperative course was  complicated by development of a methicillin-sensitive Staph aureus wound  infection at the lower end of his sternotomy incision.  This was within  the superficial subcutaneous tissue and was treated with drainage and  wound VAC.  This resolved fairly rapidly.  I last saw him back on  10/25/2006 at which time the wound was completely healed but he did have  a small ventral incisional hernia just to the right side of the lower  end of his chest incision.  This was small and measured approximately 1  to 2 cm in diameter.  It was easily reducible.  This was not causing any  symptoms and we elected to continue following this.  He has not returned  over the past year but now returns reporting marked enlargement of the  hernia.  He has continued to work at his job which is fairly strenuous.  He said whenever he is working that there is a large amount of pressure  on the ventral hernia and he is having significant discomfort from that.  He does have to strain a lot at work and does a lot of heavy lifting.  He denies any chest pain or pressure.  He has had no symptoms of bowel  obstruction.   PHYSICAL EXAMINATION:  Today, his blood pressure of 125/72 and his pulse  is 82 and regular.  Respiratory rate is 18 and unlabored.  He looks  well.  Cardiac Exam:  Shows a regular rate and rhythm with normal heart  sounds.  Lung  Exam:  Clear.  The sternotomy incision is healed.  The  skin in the lower end of the incision is thinned out from the previous  wound infection and secondary healing.  The sternum is stable.  There is  a large ventral hernia which encompasses a large amount of the  epigastrium.  This is much larger than on his previous exam 1 year ago.  It is easily reducible.   IMPRESSION:  The patient has an enlarging ventral incisional hernia at  the lower end of his sternotomy incision extending down into the  epigastric region.  This is causing him some significant discomfort work  and has progressively enlarged and I think it should be repaired.  Given  the size of this defect, I will refer him to Dr. Avel Peace of  general surgery for evaluation and consideration of whether this could  be repaired laparoscopically.  The patient is anxious to get it repaired  as quickly as possible.   [name], M.D.  Electronically Signed   BB/MEDQ  D:  11/28/2007  T:  11/28/2007  Job:  [id number]   cc:   Adolph Pollack, M.D.  Noralyn Pick. Eden Emms,  MD, [employer]

## 2010-11-24 NOTE — Assessment & Plan Note (Signed)
OFFICE VISIT   AMILCAR, REEVER  DOB:  1950-11-02                                       05/20/2010  CHART#:[id number]   The patient presents today for continued discussion regarding his venous  hypertension in his left leg.  I had seen him for initial consultation  in June of this year.  He does have marked varicosities throughout his  distal medial thigh extending across his knee and calf.  He has worn  compression garments and reports that these  actually make the  discomfort worse.  He reports that he has swelling and itching sensation  over these with any degree of standing also.  Has discontinued  activities around his home with routine activities due to leg pain and  has to sit or lie down for relief.   PHYSICAL EXAMINATION:  He is a well-developed, well-nourished white male  appearing stated age, in no acute distress.  Blood pressure is 167/84,  pulse 57, respirations 18.  He has 2+ dorsalis pedis pulses on the left.  He has marked very large varicosities extending throughout the medial  portion of his right thigh.  It crosses the knee and down onto his calf.  I reimaged this with ultrasound, and this does show refluxing great  saphenous vein.   I have recommended that we proceed with laser ablation for relief of  venous hypertension.  He does have marked tributaries and understands  that he may require staged second procedure should he continue to have  discomfort despite ablation of his great saphenous vein.  We will  schedule this procedure at his convenience.     Larina Earthly, M.D.  Electronically Signed   TFE/MEDQ  D:  05/20/2010  T:  05/21/2010  Job:  [id number]

## 2010-11-24 NOTE — Assessment & Plan Note (Signed)
[facility]                            CARDIOLOGY OFFICE NOTE   Frederick Foster, Frederick Foster                      MRN:          [id number]  DATE:11/29/2007                            DOB:          07/15/1950    Frederick Foster returns today for follow-up.  He is a previous patient of Dr.  Samule Ohm.  He had coronary artery bypass surgery, I believe last January  2008.  Unfortunately, he had preop A fib and has had a significant  sternal wound infection which had prolonged recovery.  He now has a  significant ventral hernia just below the manubrium, which may need  surgery from a general surgeon.  He works at [employer].   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  I suspect he would qualify for short-  term disability until his chest and upper abdominal wound has resolved  itself.  From a cardiac perspective, I am little bit worried about the  patency of his grafts.  He continues to smoke and frequently sternal  wound infections are associated with premature graft failure.   I told him particularly since he may need upcoming surgery by general  surgeon for his upper abdomen hernia that we should do a stress Myoview.  He has had a previous right ankle fracture and a blown left knee and  would like to have a chemical stress test.   He was counseled for less than 10 minutes in regards to his smoking.  He  understands the risk in terms of premature graft failure, lung cancer.  He is currently not very motivated to quit.   He would be a reasonable candidate for Wellbutrin.   REVIEW OF SYSTEMS:  Otherwise remarkable.  No exertional chest pain.  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   CURRENT MEDICATIONS:  1. Aspirin a day.  2. Lipitor 80 a day.  3. Avandamet 10/998 b.i.d.  4. Cardizem 240 a day.  5. Xanax 25 a day.  6. Januvia 100 mg a day.  7. Metoprolol 25 b.i.d.  8. Citalopram 20 a day.   PHYSICAL EXAMINATION:  GENERAL:  Remarkable for an overweight white male  in no distress.  Weight is 230.  Affect is appropriate.  VITAL SIGNS:  Blood pressure 120/68, pulse 63 and regular, afebrile.  HEENT:  Unremarkable.  NECK:  Carotids normal without bruit.  No lymphadenopathy, thyromegaly  or JVP elevation.  LUNGS:  Clear with good diaphragmatic motion.  No wheezing.  HEART:  S1-S2.  Normal heart sounds.  PMI normal.  He has a large  ventral hernia just below his sternal scar, but the sternal scar itself  is much improved since the last time I saw him.  ABDOMEN:  Otherwise protuberant.  Bowel sounds positive.  No AAA, no  tenderness, no hepatosplenomegaly.  No hepatojugular reflux.  No bruit.  EXTREMITIES:  Distal pulses are intact.  No edema.  NEURO:  Nonfocal.  SKIN:  Warm and dry.  No muscular weakness.  He is status post left  radial harvest as well.   IMPRESSION:  1. Previous inferior wall myocardial infarction, coronary artery      bypass grafting with continued smoking and sternal wound infection.      Follow-up stress Myoview.  2. Preoperative clearance for upper abdominal hernia surgery.  In      light of his previous coronary disease and ongoing smoking, he will      have a stress test.  He will continue his beta-blocker.  3. Diabetes.  Again, I told him tight control was really important in      terms of graft patency and limiting diabetic complications.  He      needs to monitor his sugar a little more closely at home.      Hemoglobin A1c quarterly.  Continue current oral medications.  4. Hyperlipidemia in the setting of diabetes and coronary disease.      Continue Lipitor.  Lipid and liver profile in 6 months.  5. Smoking.  Counseled for less than 10 minutes regarding this.  I      told him that he would be a reasonable candidate for Wellbutrin.      He seems to want to get through some  of the stresses at work and      fix his hernia before he contemplates this.   I will see him back in 6 months.   Addendum:  Review of the patients ECG  by the nuclear techs shows an old  IMI with 2:1 av block.  Patient is asymptomatic  we held his cardiazem and beta blocker and the following day he was in  sinus with no conduction abnormalities.  Will stop cardizem and  metoprolol and start ACE for BP control.  F/U adenosine myovue which  will also test the AV node for block.     Noralyn Pick. Eden Emms, MD, [facility]  Electronically Signed    PCN/MedQ  DD: 11/29/2007  DT: 11/29/2007  Job #: [id number]

## 2010-11-27 NOTE — Assessment & Plan Note (Signed)
[facility]                            CARDIOLOGY OFFICE NOTE   VAN, SEYMORE                      MRN:          [id number]  DATE:09/20/2006                            DOB:          11/17/1950    [name] returns today for followup.  He is status post CABG, I believe  it to be in 2008.  He subsequently had a sternal wound infection and was  reopened on January 29.  He has a followup appointment with Dr. Donata Clay this Friday.  He has had a wound VAC and been on Cipro b.i.d.   Postoperatively, he had some PAF.  This has not recurred.  He initially  presented with a subendocardial MI requiring in-house CABG.   From our perspective, he has been doing well.  He is taking an aspirin a  day.  He is on Lipitor 80 a day and needs followup LFTs in 6 months.   The patient's blood pressure is being well controlled.  He has been on  Lopressor and Cardizem in regards to his hypertension and PAF.   He is doing a lot of the gauze changes himself at home.   EXAMINATION:  VITAL SIGNS:  He is afebrile.  His blood pressure is  102/61, pulse 84 and regular.  HEENT:  Normal.  Carotids normal without bruit.  LUNGS:  Clear.  His sternum is healing better.  He has the lower portion  of his sternal wound covered with a sterile dressing.  I did not take it  off.  ABDOMEN:  Benign.  EXTREMITIES:  Lower extremities with intact pulses, no edema.  Radial  harvest site is healing better and is less erythematous.  There is no  steal syndrome in the left arm.   His medications include:  1. An aspirin a day.  2. Lopressor 25 t.i.d.  3. Lipitor 80 a day.  4. Avandamet 10/998 b.i.d.  5. Celexa 20 a day.  6. Cardizem 120.   IMPRESSION:  Stable status post coronary artery bypass grafting with  sternal wound infection.  I will see him back in 6 months.  At that time  I think we will cut his Lopressor back to 25 b.i.d. and stop his  Cardizem, so long as he has not had  any recurrent palpitations.  He has  good followup arranged for his sternal wound infection and appears to be  doing a lot of the dressing changes and care himself.  His last  antibiotic was taken yesterday.   From a cardiac perspective, he is doing well and fortunately there does  not seem to be any signs of a deeper infection requiring reduced  sternotomy or plastic surgery.     Noralyn Pick. Eden Emms, MD, [facility]  Electronically Signed    PCN/MedQ  DD: 09/20/2006  DT: 09/20/2006  Job #: [id number]

## 2010-11-27 NOTE — Discharge Summary (Signed)
Frederick Foster, Frederick Foster               ACCOUNT NO.:  [id number]   MEDICAL RECORD NO.:  [id number]          PATIENT TYPE:  INP   LOCATION:  5151                         FACILITY:  [facility]   PHYSICIAN:  Ollen Gross. Vernell Morgans, M.D. DATE OF BIRTH:  Oct 27, 1950   DATE OF ADMISSION:  03/05/2008  DATE OF DISCHARGE:  03/07/2008                               DISCHARGE SUMMARY   [name] is a 60 year old gentleman who was brought into the hospital  on March 05, 2008, for ventral hernia repair with mesh.  He tolerated  the surgery well.  Postoperatively, he was started on diet.  On postop  day #1, his Foley was discontinued and he was started on oral pain  medicines.  He tolerated this well and by postop day #2, he was ready  for discharge home with meds.  He is to resume his home meds and he was  given a prescription for Vicodin for pain.  His activity is no heavy  lifting.  Diet is as tolerated.  Followup will be with Dr. Carolynne Edouard in a  week and he is discharged home.      Ollen Gross. Vernell Morgans, M.D.  Electronically Signed     PST/MEDQ  D:  04/24/2008  T:  04/24/2008  Job:  [id number]

## 2010-11-27 NOTE — H&P (Signed)
NAMEELDRED, Frederick Foster NO.:  [id number]   MEDICAL RECORD NO.:  [id number]          PATIENT TYPE:  INP   LOCATION:  2011                         FACILITY:  [facility]   PHYSICIAN:  Frederick Holler, MD     DATE OF BIRTH:  [birth year]   DATE OF ADMISSION:  07/09/2006  DATE OF DISCHARGE:                              HISTORY & PHYSICAL   PRIMARY CARE PHYSICIAN:  Frederick Pascal, PA, at [facility].   CHIEF COMPLAINT:  Chest tightness.   HISTORY OF PRESENT ILLNESS:  Frederick Foster is a 60 year old male with a  history of tobacco use, diabetes mellitus who presented to an outside  hospital with complaints of chest tightness on Thursday and again last  night.  The patient states that both episodes occurred at rest and both  lasted for a couple of hours.  He described the discomfort as a  tightness that radiates across his entire chest with radiation to his  bilateral upper extremities.  There was associated diaphoresis.  No  associated shortness of breath.  No associated nausea.  Nothing made the  discomfort any better and nothing made the discomfort any worse.  He has  no complaints of palpitations at current, though he does have past  medical history of palpitations.  He has had no syncope or presyncope.  No PND or orthopnea.  No lower extremity swelling.  At present, the  patient is chest painfree.  He has no previous history of chest pain  with exertion.  At the outside hospital, the patient was given aspirin,  Lopressor and nitroglycerin.   PAST MEDICAL HISTORY:  1. Diabetes mellitus.  2. Cholecystectomy.  3. Palpitations.   MEDICATIONS:  1. Avandamet 1 tablet p.o. b.i.d.  2. Celexa 20 mg p.o. daily.  3. Diltiazem 240 mg p.o. daily.   ALLERGIES:  1. PENICILLIN.  2. SULFA.   SOCIAL HISTORY:  The patient smokes one and a half packs per day and  married.   FAMILY HISTORY:  Mother with history of coronary artery disease, not  premature.  Father  died in a house fire.   REVIEW OF SYSTEMS:  All systems are reviewed in detail and are negative  except as noted in the history of present illness.   PHYSICAL EXAMINATION:  VITAL SIGNS:  Heart rates in the 90s, blood  pressure 130/80.  GENERAL:  Well-developed, well-nourished male.  Alert and oriented x3,  in no apparent distress.  HEENT:  Normocephalic and atraumatic.  Pupils are equal, round, and  reactive to light.  Extraocular movements intact.  Oropharynx clear.  NECK:  Supple.  No adenopathy, no JVD, no carotid bruits.  CHEST:  Lungs clear to auscultation bilaterally with equal bilateral  breath sounds.  CORONARY:  Regular rhythm, normal rate, normal S1-S2.  No murmurs,  gallops, or rubs.  ABDOMEN:  Soft, nontender, and nondistended with active bowel sounds.  EXTREMITIES:  No clubbing, cyanosis, or edema.  NEUROLOGICAL:  No focal deficits.  SKIN:  No rashes.   LABORATORY DATA:  INR 0.8, PTT 23.  Sodium 135, potassium 4.1, chloride  102, bicarb 25, BUN 19, creatinine 0.9, glucose 272.  CKs 76, 2.5, 3.2.  Troponin 0.08.  White blood cell count 5.5, hematocrit 39.7, platelet  count 196.   IMPRESSION:  Frederick Foster is a 60 year old male with a history of diabetes  mellitus, tobacco use who presented to an outside hospital with  complaints of chest tightness, a troponin 0.08 with normal CKs and a  normal electrocardiogram.   PLAN:  1. Admit the patient to a telemetry bed.  2. Pulmonary PA and lateral chest x-ray.  3. Cardiovascular:  Place on telemetry.  Rule out serial cardiac      enzymes.  Place the patient on aspirin, Lipitor, lisinopril and      Lopressor.  Given the fact that he is a diabetic and has a history      of  tobacco use and a troponin of 0.08, I will place the patient on      a heparin drip.  Check a lipid panel and EKG.  4. Endocrine:  Sliding scale insulin, check hemoglobin A1c.  5. Fluids/electrolytes/nutrition:  Diabetic diet.      Frederick Holler, MD   Electronically Signed     TRK/MEDQ  D:  07/09/2006  T:  07/09/2006  Job:  [id number]

## 2010-11-27 NOTE — Cardiovascular Report (Signed)
NAMESEBASTION, Frederick Foster NO.:  [id number]   MEDICAL RECORD NO.:  [id number]          PATIENT TYPE:  INP   LOCATION:  2011                         FACILITY:  [facility]   PHYSICIAN:  Salvadore Farber, MD  DATE OF BIRTH:  1951-01-04   DATE OF PROCEDURE:  07/11/2006  DATE OF DISCHARGE:                            CARDIAC CATHETERIZATION   PROCEDURE:  Left heart catheterization, left ventriculography, coronary  angiography.   INDICATIONS:  [name] is a 60 year old gentleman with diabetes  mellitus and ongoing tobacco abuse.  He presents with unstable angina.  He is referred for diagnostic angiography and possible coronary  intervention.   PROCEDURAL TECHNIQUE:  Informed consent was obtained.  Under 1%  lidocaine local anesthesia, a 5-French sheath was placed in the right  common femoral artery using the modified Seldinger technique.  Diagnostic angiography and ventriculography were performed using JL-4,  JR-4, and pigtail catheters.  The pigtail catheter was then pulled back  to the suprarenal abdominal aorta.  Abdominal aortography was performed  by power injection.  Selective angiography of the left subclavian artery  was then performed using a JR-4 catheter.  Catheters were removed over a  wire and the patient transferred to the holding room in stable  condition.  He tolerated the procedure well.   COMPLICATIONS:  None.   FINDINGS:  1. LV:  112/5/13.  EF 53% without regional wall motion abnormality.  2. No aortic stenosis or mitral regurgitation.  3. Left main:  Mild luminal irregularity without significant stenosis.  4. LAD:  The proximal vessel was heavily calcified.  It gives rise to      2 small diagonals.  The proximal LAD has an 80-90% stenosis.  The      first diagonal has a 90% stenosis proximally.  This vessel was very      small.  There is a discrete 70% stenosis also in the proximal      portion of the mid-LAD.  The remainder of the vessel has only    luminal irregularities.  5. Circumflex:  A moderate-sized vessel giving rise to 2 marginals.      The second marginal has a 40% stenosis proximally an 80% stenosis      in the mid section.  6. RCA:  Large, dominant vessel.  There is a 70% stenosis in the mid      vessel and a 90% stenosis just before the origin of the PDA.  7. Left subclavian artery:  Less than 30% ostial stenosis.  The left      internal mammary artery is normal.  8. Single normal renal arteries bilaterally.  The right bifurcates      early.   IMPRESSION/PLAN:  The patient has severe multivessel coronary disease  with preserved left ventricular systolic function.  In the setting of  his diabetes and proximal left anterior descending disease, I feel he  will be better served with surgical revascularization.  Will therefore  refer for consideration of coronary artery bypass grafting.      Salvadore Farber, MD  Electronically Signed     WED/MEDQ  D:  07/11/2006  T:  07/11/2006  Job:  [id number]   cc:   Noralyn Pick. Eden Emms, MD, [facility]  [facility]

## 2010-11-27 NOTE — Discharge Summary (Signed)
NAMEGENE, GLAZEBROOK               ACCOUNT NO.:  [id number]   MEDICAL RECORD NO.:  [id number]          PATIENT TYPE:  INP   LOCATION:  2036                         FACILITY:  [facility]   PHYSICIAN:  [name], M.D.     DATE OF BIRTH:  June 18, 1951   DATE OF ADMISSION:  07/09/2006  DATE OF DISCHARGE:  07/20/2006                               DISCHARGE SUMMARY   ADMISSION DIAGNOSIS:  Chest pain.   ADDITIONAL/SECONDARY DIAGNOSES:  1. Severe 3-vessel coronary artery disease with recent acute      myocardial infarction, status post coronary artery bypass graft.  2. Diabetes mellitus type 2 with suboptimal control with hemoglobin      A1c of 9.1.  3. Postoperative paroxysmal atrial fibrillation with rapid ventricular      response, resolved.  4. Mild postoperative acute blood loss anemia.  5. History of palpitations.  6. History of cholecystectomy.  7. ALLERGIES TO PENICILLIN AND SULFA.  8. Ongoing tobacco abuse, status post inpatient smoking cessation      consult.   PROCEDURE:  1. July 15, 2006:  Coronary artery bypass grafting x3 using a left      internal mammary artery graft to the obtuse marginal branch of the      left circumflex coronary artery, left radial artery graft to the      posterior descending branch of the right coronary, and a right      internal mammary artery graft to the left anterior descending      coronary artery by Dr. [name].  2. July 11, 2006:  Cardiac catheterization by Dr. Randa Evens      showing severe 3-vessel coronary artery disease with an ejection      fraction estimated at 53% without regional wall motion      abnormalities with no aortic stenosis or mitral regurgitation      noted.  3. July 11, 2006:  Pre-CABG Dopplers showing no evidence of      significant internal carotid artery stenosis bilaterally with      vertebral artery flow antegrade and ankle brachial indices within      normal limits, at greater than 1.0  bilaterally.   BRIEF HISTORY:  Mr. Bostelman is a 60 year old Caucasian male with history  of tobacco use and diabetes mellitus who presented to an outside  hospital on July 09, 2006 with complaints of 2 episodes of chest  tightness over the previous 48 hours.  He reported that both episodes  occurred at rest and lasted a couple of hours.  Pain was described as  tightness that radiated across his entire chest, into his bilateral  upper extremities.  This was associated with diaphoresis, but no  shortness of breath or nausea.  He did report a history of palpitations,  but denied any at that time.  He also denied syncope, presyncope, PND,  orthopnea or lower extremity swelling.  He was treated with aspirin,  Lopressor, nitroglycerin and then transferred to [facility] for  further evaluation.   HOSPITAL COURSE:  Mr. Geoghegan was admitted to [facility] on  July 09, 2006 with chest pain.  Workup included EKG which showed no  significant changes.  His initial troponin was 0.08 at an outside  hospital.  Once transferred to [facility], however, his troponin peaked  at 2.45 with a peak CK of 166 and an MB of 14.9.  He was started on IV  heparin and was pain free.  He underwent cardiac catheterization on  July 11, 2006 and was found to have severe 3-vessel coronary artery  disease.  A cardiac surgery consultation was requested and the patient  was seen by Dr. [name] who recommended coronary artery bypass  grafting surgery.  On physical exam, he was noted to have severe  bilateral lower extremity varicose veins and, therefore, saphenous vein  grafts were not felt to be an appropriate option.  After undergoing  upper extremity peripheral vascular exam, the decision was made to use  bilateral internal mammaries, as well as a left radial artery graft.  After discussing risks and benefits, the patient agreed to proceed.  He  was taken to the operating room on July 15, 2006.   Postoperatively, he was transferred to the surgical intensive care unit  where he remained through postoperative day 2.  While in the care unit,  his chest tube and lines were discontinued and his heparin was also  weaned.  He was managed with IV insulin per [facility] protocol  intraoperatively, but transitioned to Lantus and gradually his Avandia  and metformin were resumed.  He was monitored for postoperative anemia,  with a hemoglobin of 9.25, at this point did not require transfusion.  On postop day 3, he was transferred to telemetry, unit 2000 for his  anticipated discharge.  The patient remained stable, but did develop a  brief atrial fibrillation with rapid ventricular response with a rate  around 150.  This was rather transient in nature and resolved prior to  needing any other medication other than his scheduled beta blocker  therapy.  He was started on Imdur postoperatively for his radial artery  graft; however, he had __________ hypotension with systolic blood  pressures in the low 90s and, therefore, this was discontinued.  He  remained on beta blocker therapy for rate control and prevention of  future atrial fibrillation.  He did require short term diuresis with  furosemide for mild fluid __________ .  Postoperatively __________  Lantus insulin was discontinued once he was on his home regimen of  Avandamet.  He was maintained on __________ sliding scale.  Of note, his  preoperative hemoglobin A1c was elevated around 9.0.  He was seen by the  diabetes educator and outpatient diabetes education was ordered.   On physical exam, Mr. Drapeau had stable vital signs with most recent  vital signs showing blood pressure of 93/58, temperature 97, heart rate  is 80-90 in sinus rhythm, oxygen saturation is 95% on room air.  His  weight was at his baseline of 105 kilograms and his extremities showed no edema.  His left upper extremity remained neurovascularly intact.  Incision  appeared to be healing well without signs of infection.  Heart  had a regular rate and rhythm.  Lungs were clear with his most recent x-  ray showing mild basilar atelectasis changes, small bilateral effusions  without pneumothorax.  His abdominal exam remained benign.  He was  tolerating a regular diet and bowel and bladder were functioning  appropriately.  His pain was controlled with oral medications and he was  ambulating with a steady gait in the hallways of cardiac rehab.  If Mr.  Winegarden has no further episodes of atrial fibrillation, it is anticipated  that he will be ready for discharge home on postoperative day 5, July 20, 2006.   DISCHARGE MEDICATIONS:  1. Enteric-coated aspirin 325 mg p.o. daily.  2. Lopressor 25 mg p.o. t.i.d.  3. Lipitor 80 mg p.o. q.p.m.  4. Avandamet 10/998 mg p.o. b.i.d.  5. Celexa 25 mg p.o. daily.  6. Tylox 1-2 tablets p.o. q.4-6 hours p.r.n. pain.   DISCHARGE INSTRUCTIONS:  Avoid driving or heavy lifting more than 6  pounds.  He is to continue daily walking and breathing exercises.  He  should call if he develops fever greater than 101, redness or drainage  from his incision site.  He is to follow a carbohydrate-modified diet.  He did undergo a smoking cessation consult and was encouraged to stop  smoking and given __________ smoking cessation class.   FOLLOWUP:  He is to see Dr. Laneta Simmers at the [facility] office in 3 weeks, the  office will contact him regarding specific appointment date and time.  He is to see Dr. Eden Emms at [facility] on July 27, 2006 at  9:30 a.m., he is to  have a chest x-ray as well at this appointment.  He should call and  schedule 2-3 week followup with his primary care Rober Skeels, physician  assistant, Lindaann Pascal, at [facility], to  reevaluate his diabetes regimen.      Jerold Coombe, P.A.      [name], M.D.  Electronically Signed    AWZ/MEDQ  D:  07/19/2006  T:  07/20/2006   Job:  [id number]   cc:   Lindaann Pascal, PA-C  Noralyn Pick Eden Emms, MD, [facility]

## 2010-11-27 NOTE — Op Note (Signed)
NAMECRESTON, Frederick Foster               ACCOUNT NO.:  [id number]   MEDICAL RECORD NO.:  [id number]          PATIENT TYPE:  INP   LOCATION:  2314                         FACILITY:  [facility]   PHYSICIAN:  [name], M.D.     DATE OF BIRTH:  29-Aug-1950   DATE OF PROCEDURE:  07/15/2006  DATE OF DISCHARGE:                               OPERATIVE REPORT   PREOPERATIVE DIAGNOSIS:  Severe three-vessel coronary disease.   POSTOPERATIVE DIAGNOSIS:  Severe three-vessel coronary disease.   OPERATIVE PROCEDURE:  Median sternotomy, extracorporeal circulation,  coronary bypass graft surgery x3 using a left internal mammary artery  graft to the obtuse marginal branch of the left circumflex coronary  artery, and a left radial artery graft to the posterior descending  branch of the right coronary, and a right internal mammary artery graft  to the left anterior descending coronary artery.   ATTENDING SURGEON:  [name], M.D.   ASSISTANT:  Coral Ceo, P.A.-C.   ANESTHESIA:  General endotracheal.   CLINICAL HISTORY:  This patient is a 60 year old gentleman with a  history of diabetes and smoking who was in his usual state of health  until last Thursday when he developed severe substernal chest pressure  and tightness at rest.  This symptoms resolved, but the following  morning, he developed recurrent symptoms that persisted.  Electrocardiogram showed no significant changes.  His initial troponin  was 0.08, and he was transferred to [facility] for further workup.  His  troponin peaked at 2.45 with a peak CPK of 156 and MB of 14.9.  Cardiac  catheterization showed 80% proximal LAD stenosis that was heavily  calcified.  There was a small first diagonal that had 90% proximal  stenosis.  The left circumflex gave off a single large marginal branch  that had 80% stenosis.  The right coronary artery had 70% midvessel  stenosis and a 90% distal stenosis before the posterior descending  branch.  Left  ventricular ejection fraction was 53%.  There was no  aortic stenosis or mitral regurgitation.  After review of the angiogram  and examination of the patient, it was though that coronary bypass graft  surgery was the best treatment to prevent further ischemia and  infarction.  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  Therefore, we made plans to use  both internal mammary grafts and a left radial artery graft.  The upper  extremity arterial Doppler exam was normal.  Allen's test was negative  bilaterally.  I discussed the operative procedure of coronary bypass  graft surgery including use of both internal mammary grafts and left  radial artery graft with the patient.  I discussed alternatives,  benefits, and risks including but not limited to bleeding, blood  transfusion, infection, stroke, myocardial infarction, graft failure,  and death.  He understood and agreed to proceed.   OPERATIVE PROCEDURE:  The patient was taken to the operating room and  placed on the table in the supine position.  After induction of general  endotracheal anesthesia, a Foley catheter was placed in  the bladder  using sterile technique.  The left arm was prepped out over an arm  board.  Then, the neck, chest, abdomen, both lower extremities and left  arm were prepped and draped in the usual sterile manner.  Then, the left  radial artery graft was harvested as a free graft.  This was exposed  through a vertical incision over the artery.  The artery was clamped  distally with an atraumatic vascular clamp, and there was a good Doppler  signal beyond the clamp indicating adequate flow to the hand through the  ulnar artery.  Then, the branches of the radial artery were divided  using the harmonic scalpel.  The artery was transected proximally and  distally and the stumps oversewn with a 2-0 silk suture ligature.  The  artery was flushed with papaverine saline  solution.  The side branches  were clipped.  The artery was placed in papaverine saline solution.   Then, the chest opened through a median sternotomy incision in the  pericardium of the midline.  Examination of the heart showed good  ventricular contractility.  The ascending aorta had no palpable plaques  in it.   Then the left internal mammary artery was harvested from the chest wall  as pedicle graft.  This is a medium caliber vessel with excellent blood  flow through it.  Then, the right internal mammary artery was harvested  as a pedicle graft.  This was a large caliber vessel with excellent  blood flow through it.   Then, the patient was heparinized, and when an adequate activated  clotting time was achieved, the distal ascending aorta was cannulated  using a 20-French aortic cannula for arterial inflow.  Venous outflow  was achieved using a two-stage venous cannula for the right atrial  appendage.  Antegrade cardioplegia and vent cannula was inserted in the  aortic root.   The patient was placed on cardiopulmonary bypass and the distal  coronaries identified.  The LAD was heavily diseased in its proximal and  mid portion with some patchy plaque present distally.  The diagonal  branch was a tiny nongraftable vessel.  The obtuse marginal branch was a  large vessel that was heavily diseased proximally but distally was a  soft graftable vessel.  The right coronary was diffusely diseased with  calcific plaque.  This extended out into the distal vessel.  The  posterior descending was located proximally where it was moderately  diseased but graftable.  The remainder of the posterolateral branches  were small vessels that were diffusely diseased.   Then, the aorta was cross clamped, and 1000 mL of cold blood antegrade  cardioplegia was administered in the aortic root with quick arrest the heart.  Systemic hypothermia to 28 degrees centigrade and topical  hypothermic iced saline was  used.  A temperature probe was placed in the  septum and insulating pad in the pericardium.   The first distal anastomosis was performed to the posterior descending  coronary artery.  The internal diameter of this vessel was about 1.6 mm.  Conduit used was the left radial artery graft.  This was anastomosed in  an end-to-side manner using continuous 8-0 Prolene suture.  Flow was  noted through the graft and was excellent.   The second distal anastomosis was performed to the obtuse marginal  branch.  The internal diameter of this vessel was about 1.75 mm.  Conduit used was the left internal mammary graft, and this was brought  through an  opening in the left pericardium anterior to the phrenic  nerve.  It was anastomosed to the obtuse marginal branch in an end-to-  side manner using continuous 8-0 Prolene suture.  The pedicle was  sutured to the epicardium with 6-0 Prolene sutures.  Then, another dose  of cardioplegia was given.   Third distal anastomosis was performed to distal LAD.  The internal  diameter of this vessel was about 2 mm.  Conduit used was the right  internal mammary graft, and this was brought through a split in the  right pericardium anterior to the phrenic nerve.  It was anastomosed to  the LAD in an end-to-side manner using continuous 8-0 Prolene suture.   Then, the proximal anastomosis of the radial artery graft was performed  to the aorta in an end-to-side manner using continuous 7-0 Prolene  suture.  Then, the clamps were removed from the mammary pedicle.  There  was rapid warming of the heart.  The crossclamp was removed with a time  of 86 minutes.  There was spontaneous return of sinus rhythm.  The  proximal and distal anastomoses appeared hemostatic ___________ the  grafts satisfactory.  Graft markers were placed around the proximal  anastomosis.  Two temporary right ventricular and right atrial pacing  wires were placed and brought through the skin.   When  the patient had rewarmed to 37 degrees centigrade, he was weaned  from cardiopulmonary bypass on no inotropic agents.  Total bypass time  was 104 minutes.  Cardiac output was excellent at 7 liters per minute.  Protamine was given, and the venous and aortic cannulas were removed  without difficulty.  Hemostasis was achieved.  Four chest tubes were  placed with bilateral pleural tubes with tube in the posterior  pericardium and one in the anterior mediastinum.  The pericardium was  loosely reapproximated over the heart.  Sternum was closed #6 stainless  steel wires.  Fascia was closed with continuous #1 Vicryl suture.  Subcutaneous tissue was closed with continuous 2-0 Vicryl and the skin  with 3-0 Vicryl subcuticular closure.  The sponge, needles and  instrument counts were correct according to the scrub nurse.  Dry  sterile dressings were applied over the incisions and around the chest tubes which were hooked to Pleur-Evac suction.  The patient remained  hemodynamically stable, was transferred to the SICU in guarded but  stable condition.      [name], M.D.  Electronically Signed     BB/MEDQ  D:  07/15/2006  T:  07/15/2006  Job:  [id number]   cc:   [name], M.D.  Salvadore Farber, MD  Cardiac Catheterization Lab

## 2010-11-27 NOTE — Consult Note (Signed)
NAME:  Frederick Foster, Frederick Foster               ACCOUNT NO.:  [id number]   MEDICAL RECORD NO.:  [id number]          PATIENT TYPE:  AMB   LOCATION:  DAY                           FACILITY:  [facility]   PHYSICIAN:  Kassie Mends, M.D.      DATE OF BIRTH:  05/27/1951   DATE OF CONSULTATION:  DATE OF DISCHARGE:                                 CONSULTATION   REQUESTING PHYSICIAN:  Ernestina Penna, M.D.   REASON FOR CONSULTATION:  Hemoccult positive stool.   HISTORY OF PRESENT ILLNESS:  Frederick Foster is a 60 year old male who  underwent physical exam by Lindaann Pascal, PA at [facility].  He was found to have Hemoccult positive stool on digital  rectal exam.  He has never had a colonoscopy.  He denies any GI  complaints at this time other than regular heartburn or indigestion,  which he may have a couple of episodes per year.  He can recall having  an episode at Thanksgiving and Christmas.  He denies any dysphagia or  odynophagia.  He denies any anorexia or early satiety.  He denies any  abdominal pain, rectal bleeding, melena, nausea or vomiting.  He does  take BC powders on a regular basis, a couple of times per week.  The  first time with this problem.   PAST MEDICAL HISTORY:  1. Diabetes mellitus.  2. Palpitations.  3. Panic attacks.  4. Recent kidney infection.  Was treated by a doctor in [place].  5. Status post cholecystectomy and herniorrhaphy.   CURRENT MEDICATIONS:  1. Avandamet 4 mg/1 gm b.i.d.  2. Diltiazem/HCL 240 mg daily.  3. Flexeril 20 mg daily.   ALLERGIES:  PENICILLIN.   FAMILY HISTORY:  No known history of colorectal carcinoma, liver, or  chronic GI problems.  Mother, age 71, with a history of CVA, coronary  artery disease, anemia.  Father deceased at age 65 due to accident.   SOCIAL HISTORY:  Frederick Foster is married.  He has four healthy children.  He is employed with [employer].  He has a 35-pack-year history of  tobacco use.  He denies any alcohol or  drug use.   REVIEW OF SYSTEMS:  CONSTITUTIONAL:  Weight is stable.  Denies any  fatigue.  CARDIOVASCULAR:  See PMH.  Denies any current chest pain.  PULMONARY:  Denies shortness of breath, dyspnea, cough, hemoptysis.  GI:  See HPI.  GU:  He was found to have microscopic hematuria.  Has been  evaluated by a urologist at [facility].   PHYSICAL EXAMINATION:  VITAL SIGNS:  Weight was 235.  Height 73 inches.  Temp 98 degrees, blood pressure 162/80, pulse 74.  GENERAL:  Frederick Foster is a 60 year old male who is alert, oriented,  pleasant, cooperative in no acute distress.  He is well-developed and  well-nourished.  HEENT:  Pharynx is clear.  Sclerae are anicteric.  Conjunctivae are  clear.  Oropharynx is pink and moist without any lesions.  NECK:  Supple without any mass or thyromegaly.  HEART:  Regular rate and rhythm with normal S1 and S2 without murmurs,  clicks, rubs or gallops.  LUNGS:  Clear to auscultation bilaterally.  ABDOMEN:  Positive bowel sounds x4.  No bruits auscultated.  Soft,  nontender, nondistended without palpable mass or hepatosplenomegaly.  No  rebound tenderness or guarding.  The exam is limited, given the  patient's body habitus.  EXTREMITIES:  Without clubbing or edema bilaterally.  SKIN:  Pink, warm, and dry without any rash or jaundice.   IMPRESSION:  Frederick Foster is a 60 year old male found to have Hemoccult-  positive stool due to rectal exam at Dr. Kathi Der office.  He denies any  gastrointestinal complaints at this time other than rare heartburn and  indigestion a couple of times per year.  He has never had a colonoscopy  and therefore would proceed with colonoscopy to rule out colorectal  carcinoma.   PLAN:  1. Colonoscopy with Dr. Cira Servant in the near future.  I have discussed      the procedure, including the risks and benefits, to include but not      limited to bleeding, infection, perforation, drug reaction.  He      agrees with the plan.  Consent will be  obtained.  2. Further workup pending colonoscopy.   We would like to thank Dr. Christell Constant for allowing [place] to participate in the  care of Frederick Foster.      Nicholas Lose, N.P.      Kassie Mends, M.D.  Electronically Signed    KC/MEDQ  D:  07/06/2006  T:  07/06/2006  Job:  [id number]   cc:   Ernestina Penna, M.D.  Fax: [phone]

## 2010-11-27 NOTE — Op Note (Signed)
Frederick Foster, Frederick Foster               ACCOUNT NO.:  [id number]   MEDICAL RECORD NO.:  [id number]          PATIENT TYPE:  INP   LOCATION:  2030                         FACILITY:  [facility]   PHYSICIAN:  [name], M.D.     DATE OF BIRTH:  [birth year]   DATE OF PROCEDURE:  08/10/2006  DATE OF DISCHARGE:                               OPERATIVE REPORT   PREOPERATIVE DIAGNOSIS:  Superficial sternal wound infection.   POSTOPERATIVE DIAGNOSIS:  Superficial sternal wound infection.   OPERATIVE PROCEDURE:  Excision and debridement of superficial sternal  wound infection.   ATTENDING SURGEON:  [name], MD   ANESTHESIA:  General endotracheal.   CLINICAL HISTORY:  This patient is a 60 year old gentleman who underwent  coronary bypass x3 on July 15, 2006, using a left internal mammary  graft to the left circumflex, a right internal mammary graft to the LAD,  and left radial artery graft to the posterior descending artery.  He did  not have  any vein grafts due to severe lower extremity varicose vein  disease.  He does have a history of diabetes that has been poorly  controlled in the past.  He returned to the emergency room this past  Sunday reporting a 2-day history of some drainage from the lower end of  his chest incision.  He was started on Cipro and sent to my office on  Tuesday.  On examination, he had drainage from the lower end of the  chest incision which had separated and opened up.  This was purulent  drainage.  The cultures are growing out methicillin-sensitive  Staphylococcus aureus.  It appeared that there was a moderate amount of  fibrinous exudate and purulent debris as well as some necrotic  subcutaneous tissue within the wound and I felt it would be best to take  the patient to the operating room to perform a complete debridement.  It  appeared that it was superficial to the sternum and the sternum is not  exposed.  The sternum felt stable.  The majority of the chest  incision  was healing.  In addition, the left arm radial artery harvest site also  had significant eschar along the incision and it appeared that he may  have a reaction to the subcuticular Vicryl suture that was used to close  his incisions.  I discussed the operative procedure of debridement with  him and the possibility that he may require further operative procedures  for debridement and may require complete debridement of his sternum and  muscle fiber reconstruction if this does not resolve with superficial  debridement and a Wound V.A.C.  He understood and wanted to proceed.  We  admitted him to the hospital and started him on intravenous antibiotics.   OPERATIVE PROCEDURE:  The patient was taken to operative room and placed  on the table in the supine position.  After induction of general  endotracheal anesthesia, the chest was prepped with Betadine soap and  solution and draped in the usual sterile manner.  The lower 3-cm of the  chest incision was opened with some purulent material  present as well as  fibrinous exudate on the subcutaneous tissues.  All the nonviable tissue  was sharply debrided back to viable-appearing tissue.  The sternum was  completely covered.  There were no sternal wires visible.  At this time,  the infection did not appear to track upwards beyond the extent of the  open wound.  I felt it would be best to place the Wound V.A.C. sponge  and continue to observe the wound and treat him with antibiotics.  The  vacuum sponge was cut to the appropriate size and placed into the wound.  It was covered with the occlusive  dressing and connected to suction at 75 mmHg.  The sponge filled the  wound completely.  The sponge, needle, and instruments counts were  correct according to the scrub nurse.  The patient was then awakened,  extubated and transported to the post anesthesia care unit in  satisfactory and stable condition.      [name], M.D.   Electronically Signed     BB/MEDQ  D:  08/10/2006  T:  08/11/2006  Job:  [id number]

## 2010-11-27 NOTE — H&P (Signed)
Frederick Foster, Frederick Foster               ACCOUNT NO.:  [id number]   MEDICAL RECORD NO.:  [id number]          PATIENT TYPE:  INP   LOCATION:  2030                         FACILITY:  [facility]   PHYSICIAN:  [name], M.D.     DATE OF BIRTH:  1951/03/08   DATE OF ADMISSION:  08/09/2006  DATE OF DISCHARGE:                              HISTORY & PHYSICAL   PRIMARY CARE PHYSICIAN:  Ernestina Penna, M.D.   CARDIOLOGIST:  Noralyn Pick. Eden Emms, MD, [facility]   CHIEF COMPLAINT:  Sternal drainage x4 days.   HISTORY OF PRESENT ILLNESS:  Frederick Foster is a 60 year old Caucasian male  status post coronary artery bypass grafting x3 on July 15, 2006, by  Dr. [name] using the left internal mammary artery to the left  circumflex, left radial artery to the posterior descending, and right  internal mammary artery to the LAD.  His postoperative course was rather  uneventful other than a brief nonsustained episode of atrial  fibrillation with rapid ventricular response on postop day #3.  He was  discharged home on July 20, 2006, and was doing relatively well until  Friday evening,  January 25, when he noted drainage along his lower  sternum.  This was associated with intermittent fevers as high as 101.9.  He presented to [facility] emergency department on August 06, 2006, and was evaluated by the on-call cardiac surgeon, Dr. Sheliah Plane, who noted serous drainage from the bottom portion of the  sternal wound with a small amount of eschar.  Drainage appeared  nonpurulent.  His left radial artery incision also had some surrounding  erythema along the skin edges.  By palpation, his sternum appeared  stable.  Chest x-ray was obtained, and the sternum appeared intact with  no apparent stripe at the midline along the sternal incision and no  pleural fusion.  His white count was normal at 8.6..  He was started on  Cipro 500 mg b.i.d. with instructions to clean his wound with hydrogen  peroxide.  He  already had a followup appointment scheduled to see Dr.  Laneta Simmers on Tuesday, January 29.  He instructed to keep this appointment.  Despite antibiotics and local wound care, his lower sternum continued to  drain.  There was also mild surrounding erythema.  He saw Dr. Laneta Simmers  today, January 9, and he performed bedside I&D of the distal portion of  the sternal wound.  The wound was packed with saline gauze dressing, and  Dr. Laneta Simmers felt the patient should be admitted for local wound care and  sternal wound debridement in the operating room.   Other than having generalized malaise, sternal soreness,  fevers , Mr.  Frederick Foster had been doing well.  He denies chest pain, shortness of breath,  peripheral edema, palpitations.  He has had no chills and no persistent  cough.  He has only had an occasional cough which he believes is related  to sinus drainage.   PAST MEDICAL HISTORY:  1. Severe three-vessel coronary artery disease with recent acute      myocardial infarction status post  coronary artery bypass graft on      July 15, 2006 as described above.  2. Diabetes mellitus, type 2, uncontrolled with last hemoglobin A1c      elevated at 9.1.  3. Nonsustained postoperative atrial fibrillation with rapid      ventricular response following CABG.  4. History of palpitations.  5. History of cholecystectomy.  6. Ongoing tobacco abuse   MEDICATIONS:  1. Cipro 500 mg p.o. b.i.d. starting on August 05, 2006.  2. Enteric-coated aspirin 325 mg p.o. daily.  3. Lopressor 25 mg p.o. 3 times a day.  4. Lipitor 80 mg p.o. q.p.m.  5. Avandamet 10/998 mg p.o. b.i.d.  6. Celexa 25 mg p.o. daily.  7. Tylox 1-2 tablets p.o. q. 4-6 h p.r.n. pain.   REVIEW OF SYSTEMS:  See HPI for her pertinent positives and negatives.  In addition, he denies any problems with his left hand with motor or  sensory function.  He also reports he has not been monitoring blood  sugars since discharge.   SOCIAL HISTORY:  He is  married and lives in [place].  He has a  history of smoking 1-1/2 packs of cigarettes per day since age 41.  He  currently says he is down to 1/2 pack a day and is planning to quit when  once he follows up with his family doctor.  He has refused nicotine  patch at this time.  He does not use alcohol.  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   FAMILY HISTORY:  Mother with a history of coronary artery disease,  premature. Father deceased in a house fire.   PHYSICAL EXAMINATION:  VITAL SIGNS: Blood pressure 112/63, heart rate  86, respirations 18, temperature 98.9 and saturation 94% on room air.  GENERAL APPEARANCE:  He is alert, cooperative Caucasian male who is in  no acute distress.  HEENT: His head is normocephalic, atraumatic.  Pupils are equal, round,  reactive to light.  Oral mucosa is pink and moist.  Sclerae are  nonicteric.  Oral cavity shows multiple missing teeth.  No abscesses  noted.  NECK:  Neck is supple with palpable carotid pulses. No bruits  auscultated.  RESPIRATORY:  Lung sounds are clear, slightly diminished at bases.  Breathing is not labored.  CARDIAC:  Heart has a regular rate and rhythm.  Telemetry shows sinus  rhythm.  No murmur, rub or gallop was noted. He has evidence of recent  median sternotomy incision at the lower portion of the incision.  There  is a wound measuring approximately 1-1/2 inches in length and about 3/4  inch in width.  The wound shows a mixture of pink and yellow tissue.  There is a small amount of serous drainage.  The wound is at least 2-3  cm in depth.  Sutures are seen within the incision.  There is mild  surrounding erythema.  The upper portion of the sternum appears intact  without drainage.  I was unable to palpate a sternal click when the  patient coughs.  ABDOMEN:  His abdomen is soft, nontender, nondistended with normoactive  bowel sounds. GU/RECTAL:  These exams were deferred.  EXTREMITIES:  Extremities  are warm and dry without edema.  His right arm  has a 2+ radial pulse. His left arm has a 1-2+ ulnar pulse.  Left hand  is neurovascular intact. There is evidence of the left radial harvest.  Incision has a dry tan eschar with mild surrounding  erythema, primarily  limited to the incision itself.  There did not appear to be any  cellulitis at this time.  His lower extremities show 1-2+ dorsalis pedis  pulses.  He has evidence of some varicose veins in lower leg.  NEUROLOGIC:  Neurologic exam is grossly intact.  Alert and oriented x4.  Speech is clear.  Muscle strength is 5/5 in upper and lower extremities  and grossly symmetrical.   ASSESSMENT:  Mr. Mccubbin is a 60 year old Caucasian male who is a diabetic  smoker.  He is status post coronary artery bypass grafting on July 25, 2006, and now presents with a sternal wound infection.   PLAN:  1. He will be admitted to [facility] under the care of Dr.      [name].  He will be admitted to telemetry unit 2000 and      started on IV Cipro b.i.d..  2. Nursing staff will continue normal saline gauze dressing changes 3      times a day to his distal sternal wound, and Dr. Laneta Simmers will plan      to take Mr. Winterrowd to the operating room on August 10, 2006, to      undergo sternal wound debridement.      Jerold Coombe, P.A.      [name], M.D.  Electronically Signed    AWZ/MEDQ  D:  08/09/2006  T:  08/09/2006  Job:  [id number]   cc:   Ernestina Penna, M.D.  Noralyn Pick. Eden Emms, MD, [facility]
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
## 2010-11-27 NOTE — Assessment & Plan Note (Signed)
[facility]                            CARDIOLOGY OFFICE NOTE   Foster, Frederick Foster                      MRN:          [id number]  DATE:07/27/2006                            DOB:          1951-06-05    HISTORY OF PRESENT ILLNESS:  Frederick Foster returns today for follow up.  He  was hospitalized on December 29 to January 9 with acute myocardial  infarction and status post coronary bypass surgery.   He had some paroxysmal atrial fibrillation postoperatively and some  blood loss.   The patient's EF was preserved.  He had a subendocardial MI.  His EF was  53%.   He had significant varicosities in his lower extremities, and he had  poor venous conduits.  He had radiographing in addition to his LIMA.   He also had a right internal mammary artery graft.  Therefore, all of  his grafts were arterial.   Since discharge, the patient has been doing well.  He has had a little  bit of cramping in his left arm where he had the radial harvested.  He  has not been back to work.  I told him I would like him to do cardiac  rehab at [facility] for 6-8 weeks and then he can go back to work.  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  His risk  factors are well modified.  He is a diabetic and is being followed by  his primary care physician.   He denies any significant chest pain, PND or orthopnea.   He needs to have a follow up appointment with Dr. Laneta Simmers arranged.  Our  x-ray machine is broken.  He will have to go to the hospital to get one.   PHYSICAL EXAMINATION:  VITAL SIGNS:  Blood pressure 120/70, pulse 70 and  regular.  HEENT:  Normal.  LUNGS:  Clear.  There is no carotid bruits.  HEART:  There is an S1, S2.  Normal heart sounds.  Sternum is well  healed.  There is a little bit of erythema around the edges of his  sternotomy.  ABDOMEN:  Benign.  He is status  post cholecystectomy.  EXTREMITIES:  Distal pulses are intact.  He has marked varicosities,  left greater than right, but they are not tender.  His radial scar is  intact with erythema around the edges and good ulnar pulse.   MEDICATIONS:  1. Aspirin a day.  2. Lopressor 25 mg t.i.d.  3. Lipitor 80 daily.  4. Avandamet 4/100 b.i.d.  5. Celexa.   STUDIES:  EKG today is normal with the suggestion of a previous inferior  wall MI.   IMPRESSION:  Stable, status post CABG, good LV function, good risk  factor modification.  Continue tight control of diabetes per primary  care physician.  Continue Lipitor at 80 mg daily.  He will need follow  up LFT's in about three months.  He will do his cardiac rehabilitation  for 6-8 weeks, then I will reassess his condition.  Although, his wounds  are a little erythematous, they do not look infected.  I will let Dr.  Laneta Simmers reassess these when he sees him next week.  He is not having any  recurrent atrial fibrillation postoperatively, and his EKG looked  excellent today in normal sinus rhythm.   Overall, I think he has recovered well from his subendocardial MI and  CABG, and he has preserved LV function.     Noralyn Pick. Eden Emms, MD, [facility]  Electronically Signed    PCN/MedQ  DD: 07/27/2006  DT: 07/27/2006  Job #: [id number]

## 2010-11-27 NOTE — Discharge Summary (Signed)
NAMEGLADYS, Frederick Foster               ACCOUNT NO.:  [id number]   MEDICAL RECORD NO.:  [id number]          PATIENT TYPE:  INP   LOCATION:  2030                         FACILITY:  [facility]   PHYSICIAN:  [name], M.D.     DATE OF BIRTH:  Jan 31, 1951   DATE OF ADMISSION:  08/09/2006  DATE OF DISCHARGE:  08/16/2006                               DISCHARGE SUMMARY   PRIMARY DIAGNOSIS:  Superficial sternal wound infection.   SECONDARY DIAGNOSES:  1. Severe three-vessel coronary artery disease with recent acute      myocardial infarction status post coronary bypass grafting July 15, 2006.  2. Diabetes mellitus type 2 diabetes, uncontrolled.  Last hemoglobin      A1c elevated at 9.1.  3. Nonsustained postoperative atrial fibrillation with rapid      ventricular response following coronary artery bypass graft.  4. History of palpitations.  5. History cholecystectomy.  6. Ongoing tobacco abuse.   IN-HOSPITAL OPERATIONS AND PROCEDURES:  Excision and debridement of  superficial sternal wound infection.   HISTORY AND PHYSICAL AND HOSPITAL COURSE:  The patient is a 60 year old  gentleman who underwent coronary artery bypass grafting x3 on July 15, 2006.  He has a history of diabetes mellitus that has been poorly  controlled in the past.  He was discharged to home on July 19, 2006 in  stable condition.  The patient returned to the emergency room with a 2-  day history of drainage from the lower end of the chest incision.  The  patient was started on Cipro and followed up with Dr. Laneta Simmers in the  office.  In the emergency room, this was cultured and noted to have  grown methicillin-sensitive Staphylococcus aureus.  At the time of his  evaluation in the office, the sternal wound had increased in severity  with increased purulent drainage and dehiscence.  Dr. Laneta Simmers discussed  with the patient, admitting him to [facility] to start IV  antibiotics and schedule the operating room  for debridement of sternal  wound with placement of a wound vac.  Risks and benefits were discussed  with the patient.  The patient acknowledged understanding and agreed to  proceed.  For details of the patient's past medical history and physical  exam, please see dictated H&P.   The patient was admitted to [facility] on August 09, 2006 and  started on ciprofloxacin IV.  He was taken to the operating room on  August 10, 2006 where he underwent incision and debridement of  superficial sternal wound infection with wound vac.  The patient  tolerated this procedure well and was readmitted to 2000.  The patient  was continued on IV ciprofloxacin.  The wound vac was changed postop day  #3.  This showed good granulation tissue.  There was no cellulitis or  pus noted.  Wound vac was again changed the next day showing  continuation of wound healing.  Vital signs were monitored during the  patient's postoperative course and remained stable.  He remained  afebrile.  He was off oxygen satting  greater than 90% on room air.  He  remained in normal sinus rhythm.  Pulmonary status was stable.  The  patient does have a history of diabetes mellitus and blood sugars were  monitored closely.  They were elevated during his hospital stay, and the  patient was restarted on Avandia and Glucophage.  Blood sugars did  stabilize prior to discharge home.  The patient was out of bed  ambulating well.  Tolerating diet well.   LABORATORY DATA:  Labs on August 12, 2006 showed white count of 4.8  hemoglobin of 10.8, hematocrit 31.4, platelet count 324.  Sodium of 134,  potassium 4.3, chloride of 100, bicarbonate of 27, BUN of 10, creatinine  0.7 with glucose of 186.   The patient was discharged to home August 15, 2006 in stable condition.  Home health nurse was arranged for wound vac dressing changes.   FOLLOW-UP APPOINTMENTS:  Follow-up appointment will be arranged with Dr.  Laneta Simmers for 1 week.  Our  office will contact the patient with that  information.   ACTIVITY:  Patient instructed no driving until released to do so, no  heavy lifting over 10 pounds.  He is told to ambulate 3-4 times per day,  progress as tolerated and continue his breathing exercises.   INDEPENDENT MEDICAL EVALUATION:  As stated above.  Home health nurse  arranged for wound vac changes Monday, Wednesday, Friday.  Please  contact the office if the patient develops fever greater than 100.0  cellulitis and purulent drainage from the wound site.   DIET:  The patient educated on diet to be low-fat, low-salt.   DISCHARGE MEDICATIONS:  1. Cipro 500 mg b.i.d.  2. Celexa 20 mg daily.  3. Lipitor 80 mg daily.  4. Lopressor 25 mg b.i.d.  5. Percocet 5/500, 1-2 tablets q.4-6h. p.r.n.  6. Avandamet 10/998 mg b.i.d.      Theda Belfast, PA      [name], M.D.  Electronically Signed    KMD/MEDQ  D:  10/05/2006  T:  10/05/2006  Job:  [id number]

## 2010-12-09 ENCOUNTER — Ambulatory Visit: Payer: Medicare Other | Admitting: Vascular Surgery

## 2011-01-20 ENCOUNTER — Ambulatory Visit: Payer: Medicare Other | Admitting: Vascular Surgery

## 2011-03-16 ENCOUNTER — Other Ambulatory Visit: Payer: Self-pay | Admitting: Family Medicine

## 2011-03-16 DIAGNOSIS — R221 Localized swelling, mass and lump, neck: Secondary | ICD-10-CM

## 2011-03-18 ENCOUNTER — Ambulatory Visit (HOSPITAL_COMMUNITY)
Admission: RE | Admit: 2011-03-18 | Discharge: 2011-03-18 | Disposition: A | Discharge status: Home / Self Care | Payer: Medicare Other | Source: Ambulatory Visit | Attending: Family Medicine | Admitting: Family Medicine

## 2011-03-18 DIAGNOSIS — R220 Localized swelling, mass and lump, head: Secondary | ICD-10-CM | POA: Insufficient documentation

## 2011-03-18 DIAGNOSIS — R599 Enlarged lymph nodes, unspecified: Secondary | ICD-10-CM | POA: Insufficient documentation

## 2011-03-18 DIAGNOSIS — R221 Localized swelling, mass and lump, neck: Secondary | ICD-10-CM

## 2011-05-04 ENCOUNTER — Other Ambulatory Visit: Payer: Self-pay | Admitting: Otolaryngology

## 2011-05-04 DIAGNOSIS — R221 Localized swelling, mass and lump, neck: Secondary | ICD-10-CM

## 2011-05-04 DIAGNOSIS — R220 Localized swelling, mass and lump, head: Secondary | ICD-10-CM

## 2011-05-07 ENCOUNTER — Ambulatory Visit
Admission: RE | Admit: 2011-05-07 | Discharge: 2011-05-07 | Disposition: A | Discharge status: Home / Self Care | Payer: Medicare Other | Source: Ambulatory Visit | Attending: Otolaryngology | Admitting: Otolaryngology

## 2011-05-07 DIAGNOSIS — R220 Localized swelling, mass and lump, head: Secondary | ICD-10-CM

## 2011-05-07 MED ORDER — IOHEXOL 300 MG/ML  SOLN
40.0000 mL | Freq: Once | INTRAMUSCULAR | Status: AC | PRN
  Administered 2011-05-07: 40 mL via INTRAVENOUS

## 2011-05-10 ENCOUNTER — Other Ambulatory Visit (HOSPITAL_COMMUNITY)
Admission: RE | Admit: 2011-05-10 | Discharge: 2011-05-10 | Disposition: A | Discharge status: Home / Self Care | Payer: Medicare Other | Source: Ambulatory Visit | Attending: Otolaryngology | Admitting: Otolaryngology

## 2011-05-10 ENCOUNTER — Other Ambulatory Visit: Payer: Self-pay | Admitting: Otolaryngology

## 2011-05-10 DIAGNOSIS — R220 Localized swelling, mass and lump, head: Secondary | ICD-10-CM | POA: Insufficient documentation

## 2011-05-12 ENCOUNTER — Telehealth: Payer: Self-pay | Admitting: Cardiovascular Disease

## 2011-05-13 ENCOUNTER — Ambulatory Visit (INDEPENDENT_AMBULATORY_CARE_PROVIDER_SITE_OTHER): Payer: Medicare Other | Admitting: Cardiovascular Disease

## 2011-05-13 ENCOUNTER — Encounter (HOSPITAL_COMMUNITY): Payer: Self-pay

## 2011-05-13 ENCOUNTER — Encounter (HOSPITAL_COMMUNITY)
Admission: RE | Admit: 2011-05-13 | Discharge: 2011-05-13 | Disposition: A | Discharge status: Home / Self Care | Payer: Medicare Other | Source: Ambulatory Visit | Attending: Otolaryngology | Admitting: Otolaryngology

## 2011-05-13 ENCOUNTER — Encounter: Payer: Self-pay | Admitting: Cardiovascular Disease

## 2011-05-13 ENCOUNTER — Encounter (INDEPENDENT_AMBULATORY_CARE_PROVIDER_SITE_OTHER): Payer: Medicare Other | Admitting: *Deleted

## 2011-05-13 DIAGNOSIS — Z951 Presence of aortocoronary bypass graft: Secondary | ICD-10-CM

## 2011-05-13 DIAGNOSIS — E782 Mixed hyperlipidemia: Secondary | ICD-10-CM

## 2011-05-13 DIAGNOSIS — I10 Essential (primary) hypertension: Secondary | ICD-10-CM

## 2011-05-13 DIAGNOSIS — E119 Type 2 diabetes mellitus without complications: Secondary | ICD-10-CM

## 2011-05-13 DIAGNOSIS — I6529 Occlusion and stenosis of unspecified carotid artery: Secondary | ICD-10-CM

## 2011-05-13 DIAGNOSIS — R0989 Other specified symptoms and signs involving the circulatory and respiratory systems: Secondary | ICD-10-CM

## 2011-05-13 HISTORY — DX: Malignant (primary) neoplasm, unspecified: C80.1

## 2011-05-13 HISTORY — DX: Depression, unspecified: F32.A

## 2011-05-13 HISTORY — DX: Other specified symptoms and signs involving the circulatory and respiratory systems: R09.89

## 2011-05-13 HISTORY — DX: Nasal congestion: R09.81

## 2011-05-13 HISTORY — DX: Atherosclerotic heart disease of native coronary artery without angina pectoris: I25.10

## 2011-05-13 HISTORY — DX: Anxiety disorder, unspecified: F41.9

## 2011-05-13 HISTORY — DX: Major depressive disorder, single episode, unspecified: F32.9

## 2011-05-13 HISTORY — DX: Diaphragmatic hernia without obstruction or gangrene: K44.9

## 2011-05-13 LAB — CBC
HCT: 36.2 % — ABNORMAL LOW (ref 39.0–52.0)
MCH: 31 pg (ref 26.0–34.0)
MCV: 85.8 fL (ref 78.0–100.0)
RDW: 12.1 % (ref 11.5–15.5)
WBC: 8.1 10*3/uL (ref 4.0–10.5)

## 2011-05-13 LAB — BASIC METABOLIC PANEL
BUN: 26 mg/dL — ABNORMAL HIGH (ref 6–23)
Calcium: 9.9 mg/dL (ref 8.4–10.5)
Chloride: 96 mEq/L (ref 96–112)
Creatinine, Ser: 1.77 mg/dL — ABNORMAL HIGH (ref 0.50–1.35)
GFR calc Af Amer: 46 mL/min — ABNORMAL LOW (ref >90)

## 2011-05-13 MED ORDER — LISINOPRIL 20 MG PO TABS: 20.0000 mg | ORAL_TABLET | Freq: Every day | ORAL | Status: DC

## 2011-05-13 NOTE — Assessment & Plan Note (Signed)
Improved last A1c 6.9.

## 2011-05-13 NOTE — Assessment & Plan Note (Signed)
Stable Clear to have ENT surgery

## 2011-05-13 NOTE — Assessment & Plan Note (Signed)
F/U carotid duplex.  ASA 

## 2011-05-13 NOTE — Pre-Procedure Instructions (Signed)
20 GID SCHOFFSTALL  05/13/2011   Your procedure is scheduled on:  Friday May 14 2011  Report to [facility] at 630 AM.  Call this number if you have problems the morning of surgery: [phone]   Remember:   Do not eat food:After Midnight.  Do not drink clear liquids: 4 Hours before arrival.  Take these medicines the morning of surgery with A SIP OF WATER: , celexa,   Do not wear jewelry, make-up or nail polish.  Do not wear lotions, powders, or perfumes. You may wear deodorant.  Do not shave 48 hours prior to surgery.  Do not bring valuables to the hospital.  Contacts, dentures or bridgework may not be worn into surgery.  Leave suitcase in the car. After surgery it may be brought to your room.  For patients admitted to the hospital, checkout time is 11:00 AM the day of discharge.   Patients discharged the day of surgery will not be allowed to drive home.  Name and phone number of your driver: Waino Mounsey [phone] specialstructions: CHG Shower Use Special Wash: 1/2 bottle night before surgery and 1/2 bottle morning of surgery.   Please read over the following fact sheets that you were given: Pain Booklet, Coughing and Deep Breathing, MRSA Information and Surgical Site Infection Prevention

## 2011-05-13 NOTE — Progress Notes (Signed)
Tim returns today for followup.he is a previous patient of Dr. Samule Ohm. He had coronary bypass surgery by Dr. Lavinia Sharps in 2009. this was complicated by sternal wound infection. He has had his ventral hernia repair by Dr. Carolynne Edouard last year.  He had his left kidney removed last year.  He needs neck surgery with Dr Pollyann Kennedy for a "swollen" gland.. 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 He is seen [facility] for cardiac rehabilitation-type issues. He has had his Lipitor stopped due to muscle pains but tolerating pravastatin He quit smoking in May. He has been on Chantix in the past because nightmares.He needs a followup carotid duplex her right bruit.  His BP has been high.  Will increase lisinopril.  He is clear to have ENT surgery   ROS: Denies fever, malais, weight loss, blurry vision, decreased visual acuity, cough, sputum, SOB, hemoptysis, pleuritic pain, palpitaitons, heartburn, abdominal pain, melena, lower extremity edema, claudication, or rash.  All other systems reviewed and negative  General: Affect appropriate Healthy:  appears stated age HEENT: normal Neck supple with swollen left submandibular gland JVP normal right  bruits no thyromegaly Lungs clear with no wheezing and good diaphragmatic motion Heart:  S1/S2 no murmur,rub, gallop or click PMI normal Abdomen: benighn, BS positve, no tenderness, no AAA no bruit.  No HSM or HJR Distal pulses intact with no bruits No edema Neuro non-focal Skin warm and dry No muscular weakness   Current Outpatient Prescriptions  Medication Sig Dispense Refill  . ALPRAZolam (XANAX) 0.25 MG tablet Take 0.25 mg by mouth at bedtime as needed.        . citalopram (CELEXA) 20 MG tablet Take 1 tablet by mouth Daily.      Marland Kitchen JANUMET 50-1000 MG per tablet Take 2 tablets by mouth Daily.      Marland Kitchen LANTUS SOLOSTAR 100 UNIT/ML injection As directed      . lisinopril (PRINIVIL,ZESTRIL) 10 MG tablet Take 1 tablet by  mouth Daily.      . pravastatin (PRAVACHOL) 40 MG tablet Take 1 tablet by mouth Daily.      . Tamsulosin HCl (FLOMAX) 0.4 MG CAPS Take 1 tablet by mouth Daily.        Allergies  Penicillins and Sulfonamide derivatives  Electrocardiogram:  NSR 68 normal ECG sinus arrythmia  Assessment and Plan

## 2011-05-13 NOTE — Assessment & Plan Note (Signed)
Increase lisinopril and low sodium diet

## 2011-05-13 NOTE — Patient Instructions (Addendum)
Your physician wants you to follow-up in: 6 MONTHS You will receive a reminder letter in the mail two months in advance. If you don't receive a letter, please call our office to schedule the follow-up appointment.   INCREASE LISINOPRIL TO 20 MG ONCE DAILY  Your physician has requested that you have a carotid duplex. This test is an ultrasound of the carotid arteries in your neck. It looks at blood flow through these arteries that supply the brain with blood. Allow one hour for this exam. There are no restrictions or special instructions.

## 2011-05-13 NOTE — Assessment & Plan Note (Signed)
Cholesterol is at goal.  Continue current dose of statin and diet Rx.  No myalgias or side effects.  F/U  LFT's in 6 months. No results found for this basename: LDLCALC             

## 2011-05-14 ENCOUNTER — Ambulatory Visit (HOSPITAL_COMMUNITY)
Admission: RE | Admit: 2011-05-14 | Discharge: 2011-05-14 | Disposition: A | Discharge status: Home / Self Care | Payer: Medicare Other | Source: Ambulatory Visit | Attending: Otolaryngology | Admitting: Otolaryngology

## 2011-05-14 ENCOUNTER — Other Ambulatory Visit: Payer: Self-pay | Admitting: Otolaryngology

## 2011-05-14 ENCOUNTER — Other Ambulatory Visit (HOSPITAL_COMMUNITY): Payer: Self-pay | Admitting: Otolaryngology

## 2011-05-14 DIAGNOSIS — I2510 Atherosclerotic heart disease of native coronary artery without angina pectoris: Secondary | ICD-10-CM | POA: Insufficient documentation

## 2011-05-14 DIAGNOSIS — Z01818 Encounter for other preprocedural examination: Secondary | ICD-10-CM | POA: Insufficient documentation

## 2011-05-14 DIAGNOSIS — E119 Type 2 diabetes mellitus without complications: Secondary | ICD-10-CM | POA: Insufficient documentation

## 2011-05-14 DIAGNOSIS — R221 Localized swelling, mass and lump, neck: Secondary | ICD-10-CM

## 2011-05-14 DIAGNOSIS — C8581 Other specified types of non-Hodgkin lymphoma, lymph nodes of head, face, and neck: Secondary | ICD-10-CM | POA: Insufficient documentation

## 2011-05-14 DIAGNOSIS — K219 Gastro-esophageal reflux disease without esophagitis: Secondary | ICD-10-CM | POA: Insufficient documentation

## 2011-05-14 LAB — GLUCOSE, CAPILLARY
Glucose-Capillary: 349 mg/dL — ABNORMAL HIGH (ref 70–99)
Glucose-Capillary: 201 mg/dL — ABNORMAL HIGH (ref 70–99)

## 2011-05-16 ENCOUNTER — Inpatient Hospital Stay (HOSPITAL_COMMUNITY)
Admission: EM | Admit: 2011-05-16 | Discharge: 2011-05-17 | DRG: 920 | Disposition: A | Discharge status: Home / Self Care | Payer: Medicare Other | Attending: Internal Medicine | Admitting: Internal Medicine

## 2011-05-16 ENCOUNTER — Encounter (HOSPITAL_COMMUNITY): Payer: Self-pay | Admitting: *Deleted

## 2011-05-16 DIAGNOSIS — Z87828 Personal history of other (healed) physical injury and trauma: Secondary | ICD-10-CM

## 2011-05-16 DIAGNOSIS — E871 Hypo-osmolality and hyponatremia: Secondary | ICD-10-CM | POA: Diagnosis present

## 2011-05-16 DIAGNOSIS — IMO0001 Reserved for inherently not codable concepts without codable children: Secondary | ICD-10-CM | POA: Diagnosis present

## 2011-05-16 DIAGNOSIS — N179 Acute kidney failure, unspecified: Secondary | ICD-10-CM | POA: Diagnosis present

## 2011-05-16 DIAGNOSIS — Z794 Long term (current) use of insulin: Secondary | ICD-10-CM

## 2011-05-16 DIAGNOSIS — R221 Localized swelling, mass and lump, neck: Secondary | ICD-10-CM | POA: Diagnosis present

## 2011-05-16 DIAGNOSIS — F329 Major depressive disorder, single episode, unspecified: Secondary | ICD-10-CM | POA: Diagnosis present

## 2011-05-16 DIAGNOSIS — Z905 Acquired absence of kidney: Secondary | ICD-10-CM

## 2011-05-16 DIAGNOSIS — E1129 Type 2 diabetes mellitus with other diabetic kidney complication: Secondary | ICD-10-CM | POA: Diagnosis present

## 2011-05-16 DIAGNOSIS — Z951 Presence of aortocoronary bypass graft: Secondary | ICD-10-CM

## 2011-05-16 DIAGNOSIS — Z85528 Personal history of other malignant neoplasm of kidney: Secondary | ICD-10-CM

## 2011-05-16 DIAGNOSIS — F3289 Other specified depressive episodes: Secondary | ICD-10-CM | POA: Diagnosis present

## 2011-05-16 DIAGNOSIS — I10 Essential (primary) hypertension: Secondary | ICD-10-CM | POA: Diagnosis present

## 2011-05-16 DIAGNOSIS — IMO0002 Reserved for concepts with insufficient information to code with codable children: Principal | ICD-10-CM | POA: Diagnosis present

## 2011-05-16 DIAGNOSIS — E785 Hyperlipidemia, unspecified: Secondary | ICD-10-CM | POA: Diagnosis present

## 2011-05-16 DIAGNOSIS — Q600 Renal agenesis, unilateral: Secondary | ICD-10-CM | POA: Insufficient documentation

## 2011-05-16 DIAGNOSIS — I2510 Atherosclerotic heart disease of native coronary artery without angina pectoris: Secondary | ICD-10-CM | POA: Diagnosis present

## 2011-05-16 DIAGNOSIS — I252 Old myocardial infarction: Secondary | ICD-10-CM

## 2011-05-16 LAB — DIFFERENTIAL
Basophils Absolute: 0 10*3/uL (ref 0.0–0.1)
Lymphocytes Relative: 11 % — ABNORMAL LOW (ref 12–46)
Neutro Abs: 7.5 10*3/uL (ref 1.7–7.7)

## 2011-05-16 LAB — BASIC METABOLIC PANEL
CO2: 22 mEq/L (ref 19–32)
Chloride: 93 mEq/L — ABNORMAL LOW (ref 96–112)
Potassium: 5 mEq/L (ref 3.5–5.1)
Sodium: 127 mEq/L — ABNORMAL LOW (ref 135–145)

## 2011-05-16 LAB — GLUCOSE, CAPILLARY
Glucose-Capillary: 216 mg/dL — ABNORMAL HIGH (ref 70–99)
Glucose-Capillary: 210 mg/dL — ABNORMAL HIGH (ref 70–99)
Glucose-Capillary: 243 mg/dL — ABNORMAL HIGH (ref 70–99)

## 2011-05-16 LAB — CBC
HCT: 38 % — ABNORMAL LOW (ref 39.0–52.0)
Platelets: 171 10*3/uL (ref 150–400)
RBC: 4.4 MIL/uL (ref 4.22–5.81)
RDW: 12 % (ref 11.5–15.5)
WBC: 9.2 10*3/uL (ref 4.0–10.5)

## 2011-05-16 LAB — PROTIME-INR
INR: 0.95 (ref 0.00–1.49)
Prothrombin Time: 12.9 seconds (ref 11.6–15.2)

## 2011-05-16 LAB — APTT: aPTT: 26 seconds (ref 24–37)

## 2011-05-16 MED ORDER — CITALOPRAM HYDROBROMIDE 20 MG PO TABS
20.0000 mg | ORAL_TABLET | Freq: Every day | ORAL | Status: DC
  Administered 2011-05-17: 20 mg via ORAL
  Filled 2011-05-16 (×2): qty 1

## 2011-05-16 MED ORDER — SODIUM CHLORIDE 0.9 % IV BOLUS (SEPSIS)
1000.0000 mL | Freq: Once | INTRAVENOUS | Status: AC
  Administered 2011-05-16: 1000 mL via INTRAVENOUS

## 2011-05-16 MED ORDER — SODIUM CHLORIDE 0.9 % IV SOLN
INTRAVENOUS | Status: DC
  Administered 2011-05-17 (×2): via INTRAVENOUS

## 2011-05-16 MED ORDER — ACETAMINOPHEN 650 MG RE SUPP: 650.0000 mg | Freq: Four times a day (QID) | RECTAL | Status: DC | PRN

## 2011-05-16 MED ORDER — ACETAMINOPHEN 325 MG PO TABS: 650.0000 mg | ORAL_TABLET | Freq: Four times a day (QID) | ORAL | Status: DC | PRN

## 2011-05-16 MED ORDER — ONDANSETRON HCL 4 MG/2ML IJ SOLN
4.0000 mg | Freq: Four times a day (QID) | INTRAMUSCULAR | Status: DC | PRN
  Administered 2011-05-16: 4 mg via INTRAVENOUS
  Filled 2011-05-16: qty 2

## 2011-05-16 MED ORDER — TAMSULOSIN HCL 0.4 MG PO CAPS
0.4000 mg | ORAL_CAPSULE | Freq: Every day | ORAL | Status: DC
  Administered 2011-05-16 – 2011-05-17 (×2): 0.4 mg via ORAL
  Filled 2011-05-16 (×2): qty 1

## 2011-05-16 MED ORDER — INSULIN GLARGINE 100 UNIT/ML ~~LOC~~ SOLN
15.0000 [IU] | Freq: Every day | SUBCUTANEOUS | Status: DC
  Administered 2011-05-16: 15 [IU] via SUBCUTANEOUS
  Filled 2011-05-16 (×2): qty 3

## 2011-05-16 MED ORDER — HYDROMORPHONE HCL PF 1 MG/ML IJ SOLN
1.0000 mg | Freq: Once | INTRAMUSCULAR | Status: AC
  Administered 2011-05-16: 1 mg via INTRAVENOUS
  Filled 2011-05-16: qty 1

## 2011-05-16 MED ORDER — HYDRALAZINE HCL 20 MG/ML IJ SOLN
5.0000 mg | Freq: Four times a day (QID) | INTRAMUSCULAR | Status: DC | PRN
  Filled 2011-05-16: qty 0.25

## 2011-05-16 MED ORDER — SIMVASTATIN 20 MG PO TABS
20.0000 mg | ORAL_TABLET | Freq: Every day | ORAL | Status: DC
  Administered 2011-05-16: 20 mg via ORAL
  Filled 2011-05-16 (×2): qty 1

## 2011-05-16 MED ORDER — ONDANSETRON HCL 4 MG PO TABS: 4.0000 mg | ORAL_TABLET | Freq: Four times a day (QID) | ORAL | Status: DC | PRN

## 2011-05-16 MED ORDER — INSULIN ASPART 100 UNIT/ML ~~LOC~~ SOLN
0.0000 [IU] | Freq: Three times a day (TID) | SUBCUTANEOUS | Status: DC
  Administered 2011-05-16: 5 [IU] via SUBCUTANEOUS
  Administered 2011-05-17: 8 [IU] via SUBCUTANEOUS
  Administered 2011-05-17: 3 [IU] via SUBCUTANEOUS
  Filled 2011-05-16 (×2): qty 3

## 2011-05-16 MED ORDER — MORPHINE SULFATE 2 MG/ML IJ SOLN: 2.0000 mg | INTRAMUSCULAR | Status: DC | PRN

## 2011-05-16 MED ORDER — ALPRAZOLAM 0.25 MG PO TABS: 0.5000 mg | ORAL_TABLET | Freq: Every evening | ORAL | Status: DC | PRN

## 2011-05-16 MED ORDER — ONDANSETRON HCL 4 MG/2ML IJ SOLN
4.0000 mg | Freq: Once | INTRAMUSCULAR | Status: AC
  Administered 2011-05-16: 4 mg via INTRAVENOUS
  Filled 2011-05-16: qty 2

## 2011-05-16 MED ORDER — OXYCODONE HCL 5 MG PO TABS
5.0000 mg | ORAL_TABLET | ORAL | Status: DC | PRN
  Administered 2011-05-16: 5 mg via ORAL
  Filled 2011-05-16: qty 1

## 2011-05-16 NOTE — ED Notes (Signed)
Pt states he feels swelling is moving up towards his ear and states he is having more muffled hearing now.

## 2011-05-16 NOTE — ED Notes (Signed)
Pt states he was walking to his car and within minutes his neck began to swell.

## 2011-05-16 NOTE — Consult Note (Signed)
Reason for Consult:left neck swellingReferring Physician: er  Frederick Foster is an 60 y.o. male.  HPI: he had a biopsy of the left neck on Friday by Dr. Pollyann Kennedy. He developed sudden swelling in the neck today which was sudden. He has no significant increase pain. There is a lot of pressure. No breathing problems or stridor. No erythema. No change in voice. No pharyngeal tumor as it is suspected he has lymphoma.  Past Medical History  Diagnosis Date  . Mixed hyperlipidemia   . AV BLOCK   . HERNIA, VENTRAL   . CORONARY ARTERY BYPASS GRAFT, TWO VESSEL, HX OF   . CAROTID BRUIT   . Coronary artery disease   . Myocardial infarction     bypass surgery x 3, sees Dr. Ellyn Hack, saw last 05/13/2011  . Vein symptom     injury to left leg due to conveyor belt.  caused injury to veins in left leg/ s/p surgery   . Sinus congestion     has allergies  . AODM     janumet/lantus  . Chronic kidney disease     has one kidney.  had left kidney removed in 2011 for cancer  . Hiatal hernia     s/p hiatal hernia surgery 2010  . Cancer      kidney ca, also mass to left neck  . Anxiety     history of panic attacks  . Depression     takes celexa    Past Surgical History  Procedure Date  . Cholecystectomy   . Kidney surgery   . Hernia repair     2010  . Coronary artery bypass graft     x3 .  Dr. Ellyn Hack is present cardiologist  . Vein surgery     s/p injury to left leg from conveyor belt  . Cardiac catheterization     2009    No family history on file.  Social History:  reports that he has quit smoking. His smoking use included Cigarettes. He has a 40 pack-year smoking history. He does not have any smokeless tobacco history on file. He reports that he drinks alcohol. He reports that he uses illicit drugs (Marijuana).  Allergies:  Allergies  Allergen Reactions  . Penicillins   . Sulfonamide Derivatives     Medications: I have reviewed the patient's current medications.  Results for orders  placed during the hospital encounter of 05/16/11 (from the past 48 hour(s))  CBC     Status: Abnormal   Collection Time   05/16/11  1:45 PM      Component Value Range Comment   WBC 9.2  4.0 - 10.5 (K/uL)    RBC 4.40  4.22 - 5.81 (MIL/uL)    Hemoglobin 13.7  13.0 - 17.0 (g/dL)    HCT 40.9 (*) 81.1 - 52.0 (%)    MCV 86.4  78.0 - 100.0 (fL)    MCH 31.1  26.0 - 34.0 (pg)    MCHC 36.1 (*) 30.0 - 36.0 (g/dL)    RDW 91.4  78.2 - 95.6 (%)    Platelets 171  150 - 400 (K/uL)   DIFFERENTIAL     Status: Abnormal   Collection Time   05/16/11  1:45 PM      Component Value Range Comment   Neutrophils Relative 81 (*) 43 - 77 (%)    Neutro Abs 7.5  1.7 - 7.7 (K/uL)    Lymphocytes Relative 11 (*) 12 - 46 (%)    Lymphs Abs  1.0  0.7 - 4.0 (K/uL)    Monocytes Relative 6  3 - 12 (%)    Monocytes Absolute 0.6  0.1 - 1.0 (K/uL)    Eosinophils Relative 1  0 - 5 (%)    Eosinophils Absolute 0.1  0.0 - 0.7 (K/uL)    Basophils Relative 0  0 - 1 (%)    Basophils Absolute 0.0  0.0 - 0.1 (K/uL)   BASIC METABOLIC PANEL     Status: Abnormal   Collection Time   05/16/11  1:45 PM      Component Value Range Comment   Sodium 127 (*) 135 - 145 (mEq/L)    Potassium 5.0  3.5 - 5.1 (mEq/L)    Chloride 93 (*) 96 - 112 (mEq/L)    CO2 22  19 - 32 (mEq/L)    Glucose, Bld 192 (*) 70 - 99 (mg/dL)    BUN 21  6 - 23 (mg/dL)    Creatinine, Ser 7.25 (*) 0.50 - 1.35 (mg/dL)    Calcium 9.7  8.4 - 10.5 (mg/dL)    GFR calc non Af Amer 41 (*) >90 (mL/min)    GFR calc Af Amer 48 (*) >90 (mL/min)     No results found.   Blood pressure 124/78, pulse 99, temperature 97.7 F (36.5 C), temperature source Oral, resp. rate 16, height 6' (1.829 m), weight 113.399 kg (250 lb), SpO2 97.00%.  Frederick Foster is an 60 y.o. male.    Past Medical History  Diagnosis Date  . Mixed hyperlipidemia   . AV BLOCK   . HERNIA, VENTRAL   . CORONARY ARTERY BYPASS GRAFT, TWO VESSEL, HX OF   . CAROTID BRUIT   . Coronary artery disease   .  Myocardial infarction     bypass surgery x 3, sees Dr. Ellyn Hack, saw last 05/13/2011  . Vein symptom     injury to left leg due to conveyor belt.  caused injury to veins in left leg/ s/p surgery   . Sinus congestion     has allergies  . AODM     janumet/lantus  . Chronic kidney disease     has one kidney.  had left kidney removed in 2011 for cancer  . Hiatal hernia     s/p hiatal hernia surgery 2010  . Cancer      kidney ca, also mass to left neck  . Anxiety     history of panic attacks  . Depression     takes celexa    Past Surgical History  Procedure Date  . Cholecystectomy   . Kidney surgery   . Hernia repair     2010  . Coronary artery bypass graft     x3 .  Dr. Ellyn Hack is present cardiologist  . Vein surgery     s/p injury to left leg from conveyor belt  . Cardiac catheterization     2009    No family history on file.  Social History:  reports that he has quit smoking. His smoking use included Cigarettes. He has a 40 pack-year smoking history. He does not have any smokeless tobacco history on file. He reports that he drinks alcohol. He reports that he uses illicit drugs (Marijuana).  Allergies:  Allergies  Allergen Reactions  . Penicillins   . Sulfonamide Derivatives     Medications: I have reviewed the patient's current medications.  Results for orders placed during the hospital encounter of 05/16/11 (from the past 48 hour(s))  CBC  Status: Abnormal   Collection Time   05/16/11  1:45 PM      Component Value Range Comment   WBC 9.2  4.0 - 10.5 (K/uL)    RBC 4.40  4.22 - 5.81 (MIL/uL)    Hemoglobin 13.7  13.0 - 17.0 (g/dL)    HCT 04.5 (*) 40.9 - 52.0 (%)    MCV 86.4  78.0 - 100.0 (fL)    MCH 31.1  26.0 - 34.0 (pg)    MCHC 36.1 (*) 30.0 - 36.0 (g/dL)    RDW 81.1  91.4 - 78.2 (%)    Platelets 171  150 - 400 (K/uL)   DIFFERENTIAL     Status: Abnormal   Collection Time   05/16/11  1:45 PM      Component Value Range Comment   Neutrophils Relative 81 (*) 43  - 77 (%)    Neutro Abs 7.5  1.7 - 7.7 (K/uL)    Lymphocytes Relative 11 (*) 12 - 46 (%)    Lymphs Abs 1.0  0.7 - 4.0 (K/uL)    Monocytes Relative 6  3 - 12 (%)    Monocytes Absolute 0.6  0.1 - 1.0 (K/uL)    Eosinophils Relative 1  0 - 5 (%)    Eosinophils Absolute 0.1  0.0 - 0.7 (K/uL)    Basophils Relative 0  0 - 1 (%)    Basophils Absolute 0.0  0.0 - 0.1 (K/uL)   BASIC METABOLIC PANEL     Status: Abnormal   Collection Time   05/16/11  1:45 PM      Component Value Range Comment   Sodium 127 (*) 135 - 145 (mEq/L)    Potassium 5.0  3.5 - 5.1 (mEq/L)    Chloride 93 (*) 96 - 112 (mEq/L)    CO2 22  19 - 32 (mEq/L)    Glucose, Bld 192 (*) 70 - 99 (mg/dL)    BUN 21  6 - 23 (mg/dL)    Creatinine, Ser 9.56 (*) 0.50 - 1.35 (mg/dL)    Calcium 9.7  8.4 - 10.5 (mg/dL)    GFR calc non Af Amer 41 (*) >90 (mL/min)    GFR calc Af Amer 48 (*) >90 (mL/min)     No results found.      No results found. Blood pressure 124/78, pulse 99, temperature 97.7 F (36.5 C), temperature source Oral, resp. rate 16, height 6' (1.829 m), weight 113.399 kg (250 lb), SpO2 97.00%. Physical Exam  Constitutional: No distress.  HENT:  Head: Normocephalic.  Nose: Nose normal.  Mouth/Throat: Oropharynx is clear and moist. No oropharyngeal exudate.  Eyes: Conjunctivae and EOM are normal. Pupils are equal, round, and reactive to light.  Neck: No tracheal deviation present. large swelling around the incision about 5 cm. No erythema and really not sigificant tenderness. The mass is very soft Respiratory: No stridor.    Assessment/Plan: Possible Left neck hematoma- he is stable and this could be just soft tissue swelling the neck is soft and not firm like typical hematoma. He will be admitted and observed and if no progression d/c. Discussed CT scan but hold for now.   Elizeo Rodriques M 05/16/2011, 3:44 PM     05/16/2011, 3:38 PM

## 2011-05-16 NOTE — ED Provider Notes (Signed)
History     CSN: [id number] Arrival date & time: 05/16/2011 11:18 AM   None     Chief Complaint  Patient presents with  . Facial Swelling    Pt here from home. had a lymphectomy on friday was walking to car and within minutes neck began swelling    (Consider location/radiation/quality/duration/timing/severity/associated sxs/prior treatment) The history is provided by the patient. No language interpreter was used.    Past Medical History  Diagnosis Date  . Mixed hyperlipidemia   . AV BLOCK   . HERNIA, VENTRAL   . CORONARY ARTERY BYPASS GRAFT, TWO VESSEL, HX OF   . CAROTID BRUIT   . Coronary artery disease   . Myocardial infarction     bypass surgery x 3, sees Dr. Ellyn Hack, saw last 05/13/2011  . Vein symptom     injury to left leg due to conveyor belt.  caused injury to veins in left leg/ s/p surgery   . Sinus congestion     has allergies  . AODM     janumet/lantus  . Chronic kidney disease     has one kidney.  had left kidney removed in 2011 for cancer  . Hiatal hernia     s/p hiatal hernia surgery 2010  . Cancer      kidney ca, also mass to left neck  . Anxiety     history of panic attacks  . Depression     takes celexa    Past Surgical History  Procedure Date  . Cholecystectomy   . Kidney surgery   . Hernia repair     2010  . Coronary artery bypass graft     x3 .  Dr. Ellyn Hack is present cardiologist  . Vein surgery     s/p injury to left leg from conveyor belt  . Cardiac catheterization     2009    No family history on file.  History  Substance Use Topics  . Smoking status: Former Smoker -- 1.0 packs/day for 40 years    Types: Cigarettes  . Smokeless tobacco: Not on file  . Alcohol Use: Yes     has not drank since 1991      Review of Systems  Allergies  Penicillins and Sulfonamide derivatives  Home Medications   Current Outpatient Rx  Name Route Sig Dispense Refill  . ALPRAZOLAM 0.25 MG PO TABS Oral Take 0.25 mg by mouth at bedtime as  needed.      . ASPIRIN 325 MG PO TABS Oral Take 325 mg by mouth daily.      Marland Kitchen CITALOPRAM HYDROBROMIDE 20 MG PO TABS Oral Take 1 tablet by mouth Daily.    Marland Kitchen JANUMET 50-1000 MG PO TABS Oral Take 2 tablets by mouth Daily.    Marland Kitchen LANTUS SOLOSTAR 100 UNIT/ML [place] SOLN  As directed    . LISINOPRIL 20 MG PO TABS Oral Take 1 tablet (20 mg total) by mouth daily. 30 tablet 12  . PRAVASTATIN SODIUM 40 MG PO TABS Oral Take 1 tablet by mouth Daily.    Marland Kitchen TAMSULOSIN HCL 0.4 MG PO CAPS Oral Take 1 tablet by mouth Daily.      BP 188/104  Pulse 74  Temp(Src) 97.7 F (36.5 C) (Oral)  Resp 16  Ht 6' (1.829 m)  Wt 250 lb (113.399 kg)  BMI 33.91 kg/m2  SpO2 96%  Physical Exam  ED Course  Procedures (including critical care time)  Labs Reviewed - No data to display No results found.  No diagnosis found.    MDM  Reports Today twice the size as it was after his biopsy several days ago. Dr. Pollyann Kennedy did the biopsy. Denies fever or difficulty breathing. Can swallow liquids but having trouble swallowing solids today. Dates that the swelling is twice the size of the original tumor.        [name], NP 05/19/11 1610  Nicholes Stairs, MD 05/19/11 [phone]

## 2011-05-16 NOTE — ED Notes (Signed)
Report given to Terri, Charity fundraiser.  Pt admitted to room 4506.  Personal belongings transferred with pt.

## 2011-05-16 NOTE — H&P (Signed)
PCP:   Monica Becton, MD, MD   Chief Complaint:  Neck swelling  HPI: Frederick Foster is 60 year old acacia male. Was past medical history of coronary artery disease status post CABG. He has dyslipidemia and solitary kidney. Patient given to the hospital complaining about left sided neck swelling and pain. [name] been following with Dr. Pollyann Kennedy from ENT. Patient did have a cervical lymphadenopathy. Last month patient has aspiration of its wall as well cervical lymph node and the cytology was inconclusive so patient under gone left cervical lymph node excisional biopsy. Patient was doing fine at home until this morning when he developed sudden swelling of the left side of his neck. Swelling is around the recent surgical procedure site. The pain was the pain was 8/10. Radiate to his left ear. Sharp, does not remember anything increases or decreases the pain. Patient denies any recent drainage from the surgical incision site. Patient evaluated by Dr. Jearld Fenton from ENT in the emergency department and recommended to admit the patient for further observation in the hospital. Please note that patient sodium ALT is 127 and his creatinine is 1.77, I do not have baseline for his creatinine.  Review of Systems:  The patient denies anorexia, fever, weight loss,, vision loss, decreased hearing, hoarseness, chest pain, syncope, dyspnea on exertion, peripheral edema, balance deficits, hemoptysis, abdominal pain, melena, hematochezia, severe indigestion/heartburn, hematuria, incontinence, genital sores, muscle weakness, suspicious skin lesions, transient blindness, difficulty walking, depression, unusual weight change, abnormal bleeding, enlarged lymph nodes, angioedema, and breast masses.  Past Medical History: Past Medical History  Diagnosis Date  . Mixed hyperlipidemia   . AV BLOCK   . HERNIA, VENTRAL   . CORONARY ARTERY BYPASS GRAFT, TWO VESSEL, HX OF   . CAROTID BRUIT   . Coronary artery disease   .  Myocardial infarction     bypass surgery x 3, sees Dr. Ellyn Hack, saw last 05/13/2011  . Vein symptom     injury to left leg due to conveyor belt.  caused injury to veins in left leg/ s/p surgery   . Sinus congestion     has allergies  . AODM     janumet/lantus  . Chronic kidney disease     has one kidney.  had left kidney removed in 2011 for cancer  . Hiatal hernia     s/p hiatal hernia surgery 2010  . Cancer      kidney ca, also mass to left neck  . Anxiety     history of panic attacks  . Depression     takes celexa   Past Surgical History  Procedure Date  . Cholecystectomy   . Kidney surgery   . Hernia repair     2010  . Coronary artery bypass graft     x3 .  Dr. Ellyn Hack is present cardiologist  . Vein surgery     s/p injury to left leg from conveyor belt  . Cardiac catheterization     2009    Medications: Prior to Admission medications   Medication Sig Start Date End Date Taking? Authorizing Provider  ALPRAZolam Prudy Feeler) 0.25 MG tablet Take 0.5 mg by mouth at bedtime as needed.     Historical Provider, MD  aspirin 325 MG tablet Take 325 mg by mouth daily.     Historical Provider, MD  citalopram (CELEXA) 20 MG tablet Take 1 tablet by mouth Daily. 04/22/11   Historical Provider, MD  JANUMET 50-1000 MG per tablet Take 1 tablet by mouth 2 (two)  times daily with a meal.  03/10/11   Historical Provider, MD  LANTUS SOLOSTAR 100 UNIT/ML injection Inject 30 Units into the skin at bedtime. As directed 03/10/11   Historical Provider, MD  lisinopril (PRINIVIL,ZESTRIL) 20 MG tablet Take 1 tablet (20 mg total) by mouth daily. 05/13/11   Wendall Stade, MD  pravastatin (PRAVACHOL) 40 MG tablet Take 1 tablet by mouth Daily. 04/22/11   Historical Provider, MD  Tamsulosin HCl (FLOMAX) 0.4 MG CAPS Take 1 tablet by mouth Daily. 04/22/11   Historical Provider, MD    Allergies:   Allergies  Allergen Reactions  . Penicillins   . Sulfonamide Derivatives     Social History:  reports that he  has quit smoking. His smoking use included Cigarettes. He has a 40 pack-year smoking history. He does not have any smokeless tobacco history on file. He reports that he drinks alcohol. He reports that he uses illicit drugs (Marijuana).  Family History: Noncontributory  Physical Exam: Filed Vitals:   05/16/11 1128 05/16/11 1257 05/16/11 1428 05/16/11 1611  BP:  188/104 124/78 141/81  Pulse:  74 99 93  Temp:      TempSrc:      Resp:      Height: 6' (1.829 m)     Weight: 113.399 kg (250 lb)     SpO2:  96% 97% 96%   General appearance: alert, cooperative and no distress Head: Normocephalic, without obvious abnormality, atraumatic Eyes: conjunctivae/corneas clear. PERRL, EOM's intact. Fundi benign. Nose: Nares normal. Septum midline. Mucosa normal. No drainage or sinus tenderness. Throat: lips, mucosa, and tongue normal; teeth and gums normal Neck: There is a clean-looking surgical incision scar in the left side of patient neck. There is swelling. There is some firmness, moderate tenderness. There is no warmth no signs of drainage or infection Resp: clear to auscultation bilaterally Chest wall: no tenderness Cardio: regular rate and rhythm, S1, S2 normal, no murmur, click, rub or gallop GI: soft, non-tender; bowel sounds normal; no masses,  no organomegaly Extremities: extremities normal, atraumatic, no cyanosis or edema Skin: Skin color, texture, turgor normal. No rashes or lesions Neurologic: Alert and oriented X 3, normal strength and tone. Normal symmetric reflexes. Normal coordination and gait   Labs on Admission:   Basename 05/16/11 1345  NA 127*  K 5.0  CL 93*  CO2 22  GLUCOSE 192*  BUN 21  CREATININE 1.72*  CALCIUM 9.7  MG --  PHOS --    Basename 05/16/11 1345  WBC 9.2  NEUTROABS 7.5  HGB 13.7  HCT 38.0*  MCV 86.4  PLT 171    Assessment/Plan Present on Admission:  .Neck swelling .Hyponatremia .ARF (acute renal failure) .HTN (hypertension)  Assessment  and plan 1. Next swelling:. Patient evaluated by ENT service in the emergency department. There is no respiratory compromise, no stridor or shortness of breath. The recommended admission for further observation in the hospital. Patient does not have fever, chills or any signs of infection. So I will hold on antibiotics. CT of the soft tissue of the neck was discussed but on hold according to ENT service note. Patient to be evaluated by Dr. Pollyann Kennedy in the morning. Patient does have some neck pain, I will use IV narcotics to treat it symptomatically.  2. Hyponatremia: Sodium is 127 last time patient had BM P. was on 05/13/2011 and his sodium was 132 and his creatinine is 1.7. This might be related to dehydration. Patient does not have any signs of fluid overload I  will give normal saline at 100 and I'll check his sodium level in the morning.   3. Acute renal failure: Patient creatinine is 1.77. I do not have a baseline for his creatinine. Please note that patient has a solitary kidney status post we'll left nephrectomy secondary to tumor. I'll stop his lisinopril and give IV fluid challenge check his kidney function in the morning.  4. Hypertension: Seems control at home. Because of wall hold on lisinopril I will start him on IV hydralazine as needed for high blood pressure.  Jurney Overacker A 05/16/2011, 4:38 PM

## 2011-05-16 NOTE — ED Notes (Signed)
Swelling in left throat appears to be getting larger.  Now appearing size of tennis ball.

## 2011-05-16 NOTE — ED Notes (Signed)
PA called to check status of picking up pt.

## 2011-05-16 NOTE — ED Notes (Signed)
Family at bedside. 

## 2011-05-16 NOTE — ED Provider Notes (Signed)
Medical screening examination/treatment/procedure(s) were conducted as a shared visit with non-physician practitioner(s) and myself.  I personally evaluated the patient during the encounter 60 year old male, who complains of an neck mass on the left side.  That is increasing in size causing pain and difficulty swallowing and speaking.  He had a biopsy, just a few days ago by Dr. Pollyann Kennedy.  It CAT scan done on October 26, is suspicious for a carcinoma.  We will reconsult ENT for evaluation.  In the emergency department.  Nicholes Stairs, MD 05/16/11 1456

## 2011-05-16 NOTE — ED Notes (Signed)
Nurse to nurse report given to [facility] RN

## 2011-05-17 DIAGNOSIS — E1129 Type 2 diabetes mellitus with other diabetic kidney complication: Secondary | ICD-10-CM | POA: Diagnosis present

## 2011-05-17 LAB — GLUCOSE, CAPILLARY: Glucose-Capillary: 184 mg/dL — ABNORMAL HIGH (ref 70–99)

## 2011-05-17 LAB — COMPREHENSIVE METABOLIC PANEL
BUN: 20 mg/dL (ref 6–23)
CO2: 24 mEq/L (ref 19–32)
Calcium: 8.9 mg/dL (ref 8.4–10.5)
Creatinine, Ser: 1.68 mg/dL — ABNORMAL HIGH (ref 0.50–1.35)
GFR calc Af Amer: 49 mL/min — ABNORMAL LOW (ref >90)
GFR calc non Af Amer: 42 mL/min — ABNORMAL LOW (ref >90)
Glucose, Bld: 174 mg/dL — ABNORMAL HIGH (ref 70–99)
Total Bilirubin: 0.5 mg/dL (ref 0.3–1.2)

## 2011-05-17 NOTE — Discharge Summary (Signed)
DISCHARGE SUMMARY  DOUGLAS SMOLINSKY  MR#: [id number]  DOB:08/15/1950  Date of Admission: 05/16/2011 Date of Discharge: 05/17/2011  Attending Physician:Clytee Heinrich A  [name] HQI:[id number],BMWUXL Andrey Campanile, MD, MD  Consults:  [facility] ENT service  Discharge Diagnoses: Present on Admission:  .Neck swelling .Hyponatremia .ARF (acute renal failure) .HTN (hypertension) .DM (diabetes mellitus), type 2, uncontrolled    Current Discharge Medication List    CONTINUE these medications which have NOT CHANGED   Details  ALPRAZolam (XANAX) 0.25 MG tablet Take 0.5 mg by mouth at bedtime as needed.     aspirin 325 MG tablet Take 325 mg by mouth daily.     citalopram (CELEXA) 20 MG tablet Take 1 tablet by mouth Daily.    JANUMET 50-1000 MG per tablet Take 1 tablet by mouth 2 (two) times daily with a meal.     LANTUS SOLOSTAR 100 UNIT/ML injection Inject 30 Units into the skin at bedtime. As directed    lisinopril (PRINIVIL,ZESTRIL) 10 MG tablet Take 10 mg by mouth daily.      pravastatin (PRAVACHOL) 40 MG tablet Take 1 tablet by mouth Daily.    Tamsulosin HCl (FLOMAX) 0.4 MG CAPS Take 1 tablet by mouth Daily.    traMADol (ULTRAM) 50 MG tablet Take 50 mg by mouth 3 (three) times daily as needed. For pain. Maximum dose= 8 tablets per day       STOP taking these medications     clindamycin (CLEOCIN) 150 MG capsule           Brief history and examination:  Mr. Naclerio is 60 year old acacia male. Was past medical history of coronary artery disease status post CABG. He has dyslipidemia and solitary kidney. Patient given to the hospital complaining about left sided neck swelling and pain. [name] been following with Dr. Pollyann Kennedy from ENT. Patient did have a cervical lymphadenopathy. Last month patient has aspiration of its wall as well cervical lymph node and the cytology was inconclusive so patient under gone left cervical lymph node excisional biopsy. Patient was doing fine at home  until this morning when he developed sudden swelling of the left side of his neck. Swelling is around the recent surgical procedure site. The pain was the pain was 8/10. Radiate to his left ear. Sharp, does not remember anything increases or decreases the pain. Patient denies any recent drainage from the surgical incision site. Patient evaluated by Dr. Jearld Fenton from ENT in the emergency department and recommended to admit the patient for further observation in the hospital. Please note that patient sodium is 127 and his creatinine is 1.77, I do not have baseline for his creatinine.  Hospital Course: Present on Admission:  .Neck swelling .Hyponatremia .ARF (acute renal failure) .HTN (hypertension) .DM (diabetes mellitus), type 2, uncontrolled:  1. Neck swelling: This is likely to be secondary to hematoma from recent lymph node excisional biopsy. Patient evaluated by Dr. Pollyann Kennedy his ENT surgeon and in the morning it was felt is much better so patient was okayed to be discharged home. Patient does not have any stridor or any respiratory compromise.  2. Hyponatremia: This is likely secondary to dehydration from patient being sick and not able to drink. Patient was started on domiciliary as a IV fluid and his sodium improved from 127.32 at the time of admission. New  3. Acute renal failure: Patient does have a solitary kidney after he had left nephrectomy in 2011 for a tumor. Patient is unaware about any chronic kidney disease. But probably this  what he had when he came in his creatinine 1.77 after aggressive IV fluid hydration his creatinine went down to 1.6. His lisinopril restart the time of discharge. This is will need close followup because of his solitary kidney.  4. Diabetes mellitus type 2 uncontrolled: Patient hemoglobin A1c is 11.1 which correlate with plasma glucose level of 272. Patient is on general meds and not Lantus insulin these were restarted at time of discharge. Patient need tighter  glycemic control to avoid renal, micro and macroagiopathic complications.  Day of Discharge BP 147/72  Pulse 85  Temp(Src) 98.2 F (36.8 C) (Oral)  Resp 20  Ht 6' (1.829 m)  Wt 113.399 kg (250 lb)  BMI 33.91 kg/m2  SpO2 91%  Physical Exam: GEN: Patient lying on his back wearing hospital gown, in no acute distress, cooperative with exam PSYCH: He is alert and oriented x4; does not appear anxious does not appear depressed; affect is normal  HEENT: Mucous membranes pink and anicteric;  Mouth: without oral thrush or lesions Eyes: PERRLA; EOM intact;  Neck: no cervical lymphadenopathy nor thyromegaly or carotid bruit; no JVD;  CHEST WALL: No tenderness, symmetrical to breathing bilaterally CHEST: Normal respiration, clear to auscultation bilaterally  HEART: Regular rate and rhythm; no murmurs, rubs or gallops, S1 and S2 heard  BACK: No kyphosis or scoliosis; no CVA tenderness  ABDOMEN:  soft non-tender; no masses, no organomegaly, normal abdominal bowel sounds; no pannus; no intertriginous candida.  Rectal Exam: Not done  EXTREMITIES: No bone or joint deformity; age-appropriate arthropathy of the hands and knees; no edema; no ulcerations.  PULSES: 2+ and symmetric, neurovascularity is intact SKIN: Normal hydration no rash or ulceration, no flushing or suspicious lesions  CNS: Cranial nerves 2-12 grossly intact no focal neurologic deficit coordination is intact gait not tested    Results for orders placed during the hospital encounter of 05/16/11 (from the past 24 hour(s))  APTT     Status: Normal   Collection Time   05/16/11  4:19 PM      Component Value Range   aPTT 26  24 - 37 (seconds)  PROTIME-INR     Status: Normal   Collection Time   05/16/11  4:19 PM      Component Value Range   Prothrombin Time 12.9  11.6 - 15.2 (seconds)   INR 0.95  0.00 - 1.49   HEMOGLOBIN A1C     Status: Abnormal   Collection Time   05/16/11  4:19 PM      Component Value Range   Hemoglobin A1C 11.1  (*) <5.7 (%)   Mean Plasma Glucose 272 (*) <117 (mg/dL)  GLUCOSE, CAPILLARY     Status: Abnormal   Collection Time   05/16/11  4:26 PM      Component Value Range   Glucose-Capillary 216 (*) 70 - 99 (mg/dL)  GLUCOSE, CAPILLARY     Status: Abnormal   Collection Time   05/16/11  6:22 PM      Component Value Range   Glucose-Capillary 210 (*) 70 - 99 (mg/dL)  GLUCOSE, CAPILLARY     Status: Abnormal   Collection Time   05/16/11  8:20 PM      Component Value Range   Glucose-Capillary 243 (*) 70 - 99 (mg/dL)   Comment 1 Documented in Chart    GLUCOSE, CAPILLARY     Status: Abnormal   Collection Time   05/16/11  9:34 PM      Component Value Range   Glucose-Capillary  212 (*) 70 - 99 (mg/dL)   Comment 1 Notify RN    GLUCOSE, CAPILLARY     Status: Abnormal   Collection Time   05/17/11  8:12 AM      Component Value Range   Glucose-Capillary 184 (*) 70 - 99 (mg/dL)  COMPREHENSIVE METABOLIC PANEL     Status: Abnormal   Collection Time   05/17/11  9:45 AM      Component Value Range   Sodium 132 (*) 135 - 145 (mEq/L)   Potassium 4.3  3.5 - 5.1 (mEq/L)   Chloride 100  96 - 112 (mEq/L)   CO2 24  19 - 32 (mEq/L)   Glucose, Bld 174 (*) 70 - 99 (mg/dL)   BUN 20  6 - 23 (mg/dL)   Creatinine, Ser 1.61 (*) 0.50 - 1.35 (mg/dL)   Calcium 8.9  8.4 - 09.6 (mg/dL)   Total Protein 6.8  6.0 - 8.3 (g/dL)   Albumin 3.1 (*) 3.5 - 5.2 (g/dL)   AST 14  0 - 37 (U/L)   ALT 11  0 - 53 (U/L)   Alkaline Phosphatase 77  39 - 117 (U/L)   Total Bilirubin 0.5  0.3 - 1.2 (mg/dL)   GFR calc non Af Amer 42 (*) >90 (mL/min)   GFR calc Af Amer 49 (*) >90 (mL/min)  GLUCOSE, CAPILLARY     Status: Abnormal   Collection Time   05/17/11 12:22 PM      Component Value Range   Glucose-Capillary 254 (*) 70 - 99 (mg/dL)   Comment 1 Notify RN      Disposition: Home   Follow-up Appts: Discharge Orders    Future Orders Please Complete By Expires   Diet - low sodium heart healthy      Increase activity slowly            Signed: Fadel Clason A 05/17/2011, 3:42 PM

## 2011-05-17 NOTE — Progress Notes (Signed)
Subjective: He feels much better this morning. He feels that the swelling has reduced by about one half. He has no problems breathing and is very hingry and wants to go home if possible.  Objective: Vital signs in last 24 hours: Temp:  [97.5 F (36.4 C)-98.2 F (36.8 C)] 98.2 F (36.8 C) (11/05 0524) Pulse Rate:  [74-99] 85  (11/05 0524) Resp:  [16-20] 20  (11/05 0524) BP: (124-188)/(72-104) 147/72 mmHg (11/05 0524) SpO2:  [91 %-97 %] 91 % (11/05 0524) Weight:  [113.399 kg (250 lb)] 250 lb (113.399 kg) (11/04 1128) Weight change:  Last BM Date: 05/16/11  Intake/Output from previous day: 11/04 0701 - 11/05 0700 In: 2103 [P.O.:600; I.V.:1503] Out: 475 [Urine:475] Intake/Output this shift:    PHYSICAL EXAM: Left neck swelling, not tense, with some ecchymosis. No warmth of the skin or erythema. Voice normal, no oral cavity swelling.  Lab Results:  [facility] 05/16/11 1345  WBC 9.2  HGB 13.7  HCT 38.0*  PLT 171   BMET  Basename 05/16/11 1345  NA 127*  K 5.0  CL 93*  CO2 22  GLUCOSE 192*  BUN 21  CREATININE 1.72*  CALCIUM 9.7    Studies/Results: No results found.  Medications: I have reviewed the patient's current medications.  Assessment/Plan: Post op hematoma, resolving nicely without surgical intervention. Continue to watch. He may go home if indicated by primary care team. He will follow up with me as out patient.  LOS: 1 day   Kevona Lupinacci H 05/17/2011, 9:35 AM

## 2011-05-17 NOTE — Progress Notes (Signed)
Inpatient Diabetes Program Recommendations  AACE/ADA: New Consensus Statement on Inpatient Glycemic Control (2009)  Target Ranges:  Prepandial:   less than 140 mg/dL      Peak postprandial:   less than 180 mg/dL (1-2 hours)      Critically ill patients:  140 - 180 mg/dL   Reason for Visit: Elevated HgbA1C: 11.1%  Inpatient Diabetes Program Recommendations Insulin - Basal: Increase to home dose: Lantus 30 qhs Outpatient Referral: Please order Diabetes Outpatient Education Diet: Add CHO modified medium

## 2011-05-17 NOTE — ED Provider Notes (Signed)
History     CSN: [id number] Arrival date & time: 05/16/2011 11:18 AM   First MD Initiated Contact with Patient 05/16/11 1338      Chief Complaint  Patient presents with  . Facial Swelling    Pt here from home. had a lymphectomy on friday was walking to car and within minutes neck began swelling    (Consider location/radiation/quality/duration/timing/severity/associated sxs/prior treatment) HPI Comments: Here today c/o L neck swelling after biopsy done 2 days ago on his L lymph node.  States that he has no trouble breathing but unable to swallow solid food.  Taking liquids ok.  Pain is 6/10 presently.  States that the swelling is three times what it was on Friday. Swelling is the size of a tennis ball.  Dr. Pollyann Kennedy performed the biopsy.  Here with is daughter and wife.  Multiple medical problems including kidney cancer with a nephrectomy, CABG and diabetes.  Has not eaten solid  Food since yesterday.  The history is provided by the patient. No language interpreter was used.    Past Medical History  Diagnosis Date  . Mixed hyperlipidemia   . AV BLOCK   . HERNIA, VENTRAL   . CORONARY ARTERY BYPASS GRAFT, TWO VESSEL, HX OF   . CAROTID BRUIT   . Coronary artery disease   . Myocardial infarction     bypass surgery x 3, sees Dr. Ellyn Hack, saw last 05/13/2011  . Vein symptom     injury to left leg due to conveyor belt.  caused injury to veins in left leg/ s/p surgery   . Sinus congestion     has allergies  . AODM     janumet/lantus  . Chronic kidney disease     has one kidney.  had left kidney removed in 2011 for cancer  . Hiatal hernia     s/p hiatal hernia surgery 2010  . Cancer      kidney ca, also mass to left neck  . Anxiety     history of panic attacks  . Depression     takes celexa    Past Surgical History  Procedure Date  . Cholecystectomy   . Kidney surgery   . Hernia repair     2010  . Coronary artery bypass graft     x3 .  Dr. Ellyn Hack is present cardiologist  .  Vein surgery     s/p injury to left leg from conveyor belt  . Cardiac catheterization     2009    History reviewed. No pertinent family history.  History  Substance Use Topics  . Smoking status: Former Smoker -- 1.0 packs/day for 40 years    Types: Cigarettes  . Smokeless tobacco: Not on file  . Alcohol Use: Yes     has not drank since 1991      Review of Systems  Constitutional: Negative.   HENT: Positive for neck pain. Negative for ear pain, facial swelling and neck stiffness.        L neck swelling size of a tennis ball  Eyes: Negative.   Genitourinary: Negative.   Skin: Negative.   Neurological: Negative.   Hematological: Negative.   Psychiatric/Behavioral: Negative.     Allergies  Penicillins and Sulfonamide derivatives  Home Medications  No current outpatient prescriptions on file.  BP 147/72  Pulse 85  Temp(Src) 98.2 F (36.8 C) (Oral)  Resp 20  Ht 6' (1.829 m)  Wt 250 lb (113.399 kg)  BMI 33.91 kg/m2  SpO2 91%  Physical Exam  Constitutional: He is oriented to person, place, and time. He appears well-developed and well-nourished. He is cooperative. He is easily aroused. He has a sickly appearance.  HENT:  Head: Normocephalic.  Right Ear: External ear normal.  Left Ear: External ear normal.  Nose: Nose normal.  Mouth/Throat: Oropharynx is clear and moist.  Eyes: Pupils are equal, round, and reactive to light. Right eye exhibits no discharge. Left eye exhibits no discharge.  Neck:       Swelling to the L neck  Cardiovascular: Normal rate and normal heart sounds.   Pulmonary/Chest: Effort normal and breath sounds normal.  Abdominal: Soft. Bowel sounds are normal.  Musculoskeletal: Normal range of motion.  Lymphadenopathy:    He has cervical adenopathy.  Neurological: He is alert, oriented to person, place, and time and easily aroused.    ED Course  Procedures (including critical care time)  Labs Reviewed  CBC - Abnormal; Notable for the  following:    HCT 38.0 (*)    MCHC 36.1 (*)    All other components within normal limits  DIFFERENTIAL - Abnormal; Notable for the following:    Neutrophils Relative 81 (*)    Lymphocytes Relative 11 (*)    All other components within normal limits  BASIC METABOLIC PANEL - Abnormal; Notable for the following:    Sodium 127 (*)    Chloride 93 (*)    Glucose, Bld 192 (*)    Creatinine, Ser 1.72 (*)    GFR calc non Af Amer 41 (*)    GFR calc Af Amer 48 (*)    All other components within normal limits  HEMOGLOBIN A1C - Abnormal; Notable for the following:    Hemoglobin A1C 11.1 (*)    Mean Plasma Glucose 272 (*)    All other components within normal limits  GLUCOSE, CAPILLARY - Abnormal; Notable for the following:    Glucose-Capillary 216 (*)    All other components within normal limits  GLUCOSE, CAPILLARY - Abnormal; Notable for the following:    Glucose-Capillary 210 (*)    All other components within normal limits  GLUCOSE, CAPILLARY - Abnormal; Notable for the following:    Glucose-Capillary 243 (*)    All other components within normal limits  GLUCOSE, CAPILLARY - Abnormal; Notable for the following:    Glucose-Capillary 212 (*)    All other components within normal limits  GLUCOSE, CAPILLARY - Abnormal; Notable for the following:    Glucose-Capillary 184 (*)    All other components within normal limits  APTT  PROTIME-INR  POCT CBG MONITORING  COMPREHENSIVE METABOLIC PANEL   No results found.   No diagnosis found.    MDM  Here with L neck swelling after biopsy of the L cervical lymph node 2 days ago per Dr. Pollyann Kennedy.  Breathing without difficulty.  Dr.  Donell Beers to consult and [facility] to admit.  Stable.  Na 12        [name], NP 05/17/11 1159  [name], NP 05/19/11 [phone]

## 2011-05-19 NOTE — ED Provider Notes (Signed)
Medical screening examination/treatment/procedure(s) were performed by non-physician practitioner and as supervising physician I was immediately available for consultation/collaboration.  Nicholes Stairs, MD 05/19/11 1606

## 2011-05-28 ENCOUNTER — Other Ambulatory Visit (HOSPITAL_COMMUNITY): Payer: Self-pay | Admitting: Internal Medicine

## 2011-05-28 DIAGNOSIS — C8290 Follicular lymphoma, unspecified, unspecified site: Secondary | ICD-10-CM

## 2011-05-28 DIAGNOSIS — C8590 Non-Hodgkin lymphoma, unspecified, unspecified site: Secondary | ICD-10-CM

## 2011-06-07 ENCOUNTER — Encounter: Payer: Self-pay | Admitting: Nurse Practitioner

## 2011-06-07 DIAGNOSIS — R0602 Shortness of breath: Secondary | ICD-10-CM

## 2011-06-08 ENCOUNTER — Encounter (HOSPITAL_COMMUNITY): Payer: Self-pay | Admitting: Pharmacy Technician

## 2011-06-09 ENCOUNTER — Encounter (HOSPITAL_COMMUNITY)
Admission: RE | Admit: 2011-06-09 | Discharge: 2011-06-09 | Disposition: A | Discharge status: Home / Self Care | Payer: Medicare Other | Source: Ambulatory Visit | Attending: Internal Medicine | Admitting: Internal Medicine

## 2011-06-09 ENCOUNTER — Other Ambulatory Visit: Payer: Self-pay | Admitting: Radiology

## 2011-06-09 DIAGNOSIS — C8290 Follicular lymphoma, unspecified, unspecified site: Secondary | ICD-10-CM

## 2011-06-09 DIAGNOSIS — C8589 Other specified types of non-Hodgkin lymphoma, extranodal and solid organ sites: Secondary | ICD-10-CM | POA: Insufficient documentation

## 2011-06-09 DIAGNOSIS — K7689 Other specified diseases of liver: Secondary | ICD-10-CM | POA: Insufficient documentation

## 2011-06-09 LAB — GLUCOSE, CAPILLARY: Glucose-Capillary: 205 mg/dL — ABNORMAL HIGH (ref 70–99)

## 2011-06-09 MED ORDER — VANCOMYCIN HCL 1000 MG IV SOLR: 1000.0000 mg | Freq: Once | INTRAVENOUS | Status: DC

## 2011-06-09 MED ORDER — FLUDEOXYGLUCOSE F - 18 (FDG) INJECTION
17.7000 | Freq: Once | INTRAVENOUS | Status: AC | PRN
  Administered 2011-06-09: 17.7 via INTRAVENOUS

## 2011-06-10 ENCOUNTER — Inpatient Hospital Stay (HOSPITAL_COMMUNITY): Admission: RE | Admit: 2011-06-10 | Payer: Medicare Other | Source: Ambulatory Visit

## 2011-06-10 ENCOUNTER — Other Ambulatory Visit (HOSPITAL_COMMUNITY): Payer: Self-pay | Admitting: Internal Medicine

## 2011-06-10 ENCOUNTER — Encounter (HOSPITAL_COMMUNITY): Payer: Self-pay

## 2011-06-10 ENCOUNTER — Ambulatory Visit (HOSPITAL_COMMUNITY)
Admission: RE | Admit: 2011-06-10 | Discharge: 2011-06-10 | Disposition: A | Discharge status: Home / Self Care | Payer: Medicare Other | Source: Ambulatory Visit | Attending: Internal Medicine | Admitting: Internal Medicine

## 2011-06-10 DIAGNOSIS — Z951 Presence of aortocoronary bypass graft: Secondary | ICD-10-CM | POA: Insufficient documentation

## 2011-06-10 DIAGNOSIS — E785 Hyperlipidemia, unspecified: Secondary | ICD-10-CM | POA: Insufficient documentation

## 2011-06-10 DIAGNOSIS — I252 Old myocardial infarction: Secondary | ICD-10-CM | POA: Insufficient documentation

## 2011-06-10 DIAGNOSIS — Z79899 Other long term (current) drug therapy: Secondary | ICD-10-CM | POA: Insufficient documentation

## 2011-06-10 DIAGNOSIS — N189 Chronic kidney disease, unspecified: Secondary | ICD-10-CM | POA: Insufficient documentation

## 2011-06-10 DIAGNOSIS — I2510 Atherosclerotic heart disease of native coronary artery without angina pectoris: Secondary | ICD-10-CM | POA: Insufficient documentation

## 2011-06-10 DIAGNOSIS — C8590 Non-Hodgkin lymphoma, unspecified, unspecified site: Secondary | ICD-10-CM

## 2011-06-10 DIAGNOSIS — Z794 Long term (current) use of insulin: Secondary | ICD-10-CM | POA: Insufficient documentation

## 2011-06-10 DIAGNOSIS — C8589 Other specified types of non-Hodgkin lymphoma, extranodal and solid organ sites: Secondary | ICD-10-CM | POA: Insufficient documentation

## 2011-06-10 LAB — GLUCOSE, CAPILLARY: Glucose-Capillary: 263 mg/dL — ABNORMAL HIGH (ref 70–99)

## 2011-06-10 MED ORDER — HYDRALAZINE HCL 20 MG/ML IJ SOLN
INTRAMUSCULAR | Status: DC | PRN
  Administered 2011-06-10: 10 mg via INTRAVENOUS

## 2011-06-10 MED ORDER — HYDRALAZINE HCL 20 MG/ML IJ SOLN
INTRAMUSCULAR | Status: AC
  Filled 2011-06-10: qty 1

## 2011-06-10 MED ORDER — VANCOMYCIN HCL IN DEXTROSE 1-5 GM/200ML-% IV SOLN
1000.0000 mg | Freq: Once | INTRAVENOUS | Status: DC
Start: 2011-06-10 — End: 2011-06-10
  Filled 2011-06-10: qty 200

## 2011-06-10 MED ORDER — SODIUM CHLORIDE 0.9 % IV SOLN: INTRAVENOUS | Status: DC

## 2011-06-10 MED ORDER — HEPARIN SODIUM (PORCINE) 1000 UNIT/ML IJ SOLN
INTRAMUSCULAR | Status: DC | PRN
  Administered 2011-06-10: 500 [IU] via INTRAVENOUS

## 2011-06-10 MED ORDER — FENTANYL CITRATE 0.05 MG/ML IJ SOLN
INTRAMUSCULAR | Status: DC | PRN
  Administered 2011-06-10 (×2): 100 ug via INTRAVENOUS

## 2011-06-10 NOTE — Procedures (Signed)
Placement of right chest Portacath.  Tip at cavoatrial junction. No immediate complication.

## 2011-06-10 NOTE — H&P (Signed)
Agree with PA note. 

## 2011-06-10 NOTE — H&P (Addendum)
Frederick Foster is an 60 y.o. male.   Chief Complaint:  Non Hodgkins Lymphoma HPI: Port-A-Cath Placement  Past Medical History  Diagnosis Date  . Mixed hyperlipidemia   . AV BLOCK   . HERNIA, VENTRAL   . CORONARY ARTERY BYPASS GRAFT, TWO VESSEL, HX OF   . CAROTID BRUIT   . Coronary artery disease   . Myocardial infarction     bypass surgery x 3, sees Dr. Nissen, saw last 05/13/2011  . Vein symptom     injury to left leg due to conveyor belt.  caused injury to veins in left leg/ s/p surgery   . Sinus congestion     has allergies  . AODM     janumet/lantus  . Chronic kidney disease     has one kidney.  had left kidney removed in 2011 for cancer  . Hiatal hernia     s/p hiatal hernia surgery 2010  . Cancer      kidney ca, also mass to left neck  . Anxiety     history of panic attacks  . Depression     takes celexa    Past Surgical History  Procedure Date  . Cholecystectomy   . Kidney surgery   . Hernia repair     2010  . Coronary artery bypass graft     x3 .  Dr. Nissen is present cardiologist  . Vein surgery     s/p injury to left leg from conveyor belt  . Cardiac catheterization     2009    History reviewed. No pertinent family history. Social History:  reports that he has quit smoking. His smoking use included Cigarettes. He has a 40 pack-year smoking history. He does not have any smokeless tobacco history on file. He reports that he drinks alcohol. He reports that he uses illicit drugs (Marijuana).  Allergies:  Allergies  Allergen Reactions  . Penicillins Swelling  . Sulfonamide Derivatives Rash    Medications Prior to Admission  Medication Sig Dispense Refill  . ALPRAZolam (XANAX) 0.25 MG tablet Take 0.5 mg by mouth at bedtime as needed.       . citalopram (CELEXA) 20 MG tablet Take 1 tablet by mouth every morning.       . JANUMET 50-1000 MG per tablet Take 1 tablet by mouth 2 (two) times daily with a meal.       . LANTUS SOLOSTAR 100 UNIT/ML injection  Inject 30 Units into the skin at bedtime. As directed      . lisinopril (PRINIVIL,ZESTRIL) 20 MG tablet Take 20 mg by mouth every morning.        . naproxen sodium (ANAPROX) 220 MG tablet Take 220 mg by mouth 2 (two) times daily as needed. For pain       . pravastatin (PRAVACHOL) 40 MG tablet Take 1 tablet by mouth Daily.      . Tamsulosin HCl (FLOMAX) 0.4 MG CAPS Take 1 tablet by mouth Daily.       Medications Prior to Admission  Medication Dose Route Frequency Provider Last Rate Last Dose  . 0.9 %  sodium chloride infusion   Intravenous Continuous Krue Peterka A Ailen Strauch, PA      . fludeoxyglucose F - 18 (FDG) injection 17.7 milli Curie  17.7 milli Curie Intravenous Once PRN Medication Radiologist   17.7 milli Curie at 06/09/11 0706  . vancomycin (VANCOCIN) IVPB 1000 mg/200 mL premix  1,000 mg Intravenous Once Adam Ryan Henn      .   DISCONTD: vancomycin (VANCOCIN) 1,000 mg in sodium chloride 0.9 % 500 mL IVPB  1,000 mg Intravenous Once Leslie Jester A Cason Luffman, PA      . DISCONTD: vancomycin (VANCOCIN) 1,000 mg in sodium chloride 0.9 % 500 mL IVPB  1,000 mg Intravenous Once Neziah Braley A Brinden Kincheloe, PA        Results for orders placed during the hospital encounter of 06/10/11 (from the past 48 hour(s))  GLUCOSE, CAPILLARY     Status: Abnormal   Collection Time   06/10/11  7:45 AM      Component Value Range Comment   Glucose-Capillary 263 (*) 70 - 99 (mg/dL)    Nm Pet Image Initial (pi) Skull Base To Thigh  06/09/2011  *RADIOLOGY REPORT*  Clinical Data:  Initial staging for non-Hodgkins lymphoma.  INITIAL NUCLEAR MEDICINE FDG PET CT TUMOR IMAGING- (SKULL BASE THROUGH THIGHS)  Technique:  17.7 mCi F-18 FDG was injected intravenously via the right antecubital fossa.  Full-ring PET imaging was performed from the skull base through the mid-thighs 58  minutes after injection. CT data was obtained and used for attenuation correction and anatomic localization only.  (This was not acquired as a diagnostic CT examination.)   Fasting Blood Glucose:  205  Comparison: Neck CT 05/07/2011.  CTs of the abdomen and pelvis 12/29/2007.  Findings: The previously identified enlarged left level IIA lymph node has been surgically resected. Minimal residual hypermetabolic activity in this area (SUV max 4.3) is consistent with postsurgical change.  No hypermetabolic cervical lymph nodes are identified. There is mild asymmetric activity within the right nasopharynx (SUV max 5.4).  No focal abnormality is apparent in this region on the CT images.  Lymphoid tissue in Waldeyer's ring otherwise demonstrates symmetric activity which is within physiologic limits.  There is no hypermetabolic nodal activity within the chest, abdomen or pelvis.  However, there is a focal hypermetabolic lesion centrally in the right hepatic lobe.  This demonstrates an SUV max of 7.8 and corresponds with a low density lesion measuring approximately 2.4 x 3.1 cm on image 136.  No abnormality was apparent in this area on the 2009 abdominal CT.  No other hepatic lesions are identified.  There is no hypermetabolic activity within the spleen which is normal in size.  The adrenal glands, bones and lungs demonstrate normal metabolic activity.  The CT images demonstrate diffuse vascular calcifications status post CABG.  There is mild bilateral gynecomastia.  The adrenal glands appear normal.  The left kidney has been surgically resected.  The right kidney appears unremarkable.  Facet degenerative changes are noted in the lower lumbar spine.  IMPRESSION:  1.  Interval surgical resection of the left level IIA cervical lymph node. No hypermetabolic lymph nodes are demonstrated. 2.  Mildly asymmetric metabolic activity in the right nasopharynx is nonspecific and without definite focal lesion on CT. 3.  Hypermetabolic central liver lesion consistent with malignancy. This could be related to the patient's lymphoma or a different process.  Further evaluation with MRI without and with contrast  recommended. 4.  No other evidence of metastatic disease.  Original Report Authenticated By: WILLIAM B. VEAZEY, M.D.    Review of Systems  Constitutional: Negative for fever.  Eyes: Negative for blurred vision.  Respiratory: Negative for cough.   Cardiovascular: Negative for chest pain.  Gastrointestinal: Negative for nausea, vomiting, abdominal pain and diarrhea.  Neurological: Negative for dizziness and headaches.    Temperature 97.7 F (36.5 C). Physical Exam  Constitutional: He is oriented to person, place,   and time. He appears well-developed and well-nourished.  HENT:  Head: Normocephalic.  Eyes: EOM are normal.  Neck: Normal range of motion.  Cardiovascular: Normal rate, regular rhythm and normal heart sounds.   No murmur heard. Respiratory: Effort normal and breath sounds normal. He has no wheezes.  GI: Soft. Bowel sounds are normal. He exhibits no mass. There is no tenderness.  Musculoskeletal: Normal range of motion.  Neurological: He is alert and oriented to person, place, and time.  Skin: Skin is warm and dry.     Assessment/Plan NHL; scheduled for PAC placement today for Chemo therapy Pt aware of procedure benefits and risks and agreeable to proceed. Consent signed.  Parrie Rasco A 06/10/2011, 7:53 AM    

## 2011-06-18 ENCOUNTER — Other Ambulatory Visit (HOSPITAL_COMMUNITY): Payer: Self-pay | Admitting: Internal Medicine

## 2011-06-18 DIAGNOSIS — R160 Hepatomegaly, not elsewhere classified: Secondary | ICD-10-CM

## 2011-06-21 ENCOUNTER — Other Ambulatory Visit: Payer: Self-pay | Admitting: Physician Assistant

## 2011-06-22 ENCOUNTER — Encounter (HOSPITAL_COMMUNITY): Payer: Self-pay | Admitting: Physician Assistant

## 2011-06-22 ENCOUNTER — Ambulatory Visit (HOSPITAL_COMMUNITY): Admission: RE | Admit: 2011-06-22 | Payer: Medicare Other | Source: Ambulatory Visit

## 2011-06-22 ENCOUNTER — Ambulatory Visit (HOSPITAL_COMMUNITY)
Admission: RE | Admit: 2011-06-22 | Discharge: 2011-06-22 | Disposition: A | Discharge status: Home / Self Care | Payer: Medicare Other | Source: Ambulatory Visit | Attending: Internal Medicine | Admitting: Internal Medicine

## 2011-06-22 ENCOUNTER — Other Ambulatory Visit (HOSPITAL_COMMUNITY): Payer: Self-pay | Admitting: Internal Medicine

## 2011-06-22 DIAGNOSIS — R945 Abnormal results of liver function studies: Secondary | ICD-10-CM | POA: Insufficient documentation

## 2011-06-22 DIAGNOSIS — R160 Hepatomegaly, not elsewhere classified: Secondary | ICD-10-CM

## 2011-06-22 DIAGNOSIS — C8590 Non-Hodgkin lymphoma, unspecified, unspecified site: Secondary | ICD-10-CM

## 2011-06-22 DIAGNOSIS — C8589 Other specified types of non-Hodgkin lymphoma, extranodal and solid organ sites: Secondary | ICD-10-CM | POA: Insufficient documentation

## 2011-06-22 MED ORDER — SODIUM CHLORIDE 0.9 % IV SOLN: INTRAVENOUS | Status: DC

## 2011-06-22 NOTE — H&P (View-Only) (Signed)
Frederick Foster is an 60 y.o. male.   Chief Complaint:  Non Hodgkins Lymphoma HPI: Port-A-Cath Placement  Past Medical History  Diagnosis Date  . Mixed hyperlipidemia   . AV BLOCK   . HERNIA, VENTRAL   . CORONARY ARTERY BYPASS GRAFT, TWO VESSEL, HX OF   . CAROTID BRUIT   . Coronary artery disease   . Myocardial infarction     bypass surgery x 3, sees Dr. Ellyn Hack, saw last 05/13/2011  . Vein symptom     injury to left leg due to conveyor belt.  caused injury to veins in left leg/ s/p surgery   . Sinus congestion     has allergies  . AODM     janumet/lantus  . Chronic kidney disease     has one kidney.  had left kidney removed in 2011 for cancer  . Hiatal hernia     s/p hiatal hernia surgery 2010  . Cancer      kidney ca, also mass to left neck  . Anxiety     history of panic attacks  . Depression     takes celexa    Past Surgical History  Procedure Date  . Cholecystectomy   . Kidney surgery   . Hernia repair     2010  . Coronary artery bypass graft     x3 .  Dr. Ellyn Hack is present cardiologist  . Vein surgery     s/p injury to left leg from conveyor belt  . Cardiac catheterization     2009    History reviewed. No pertinent family history. Social History:  reports that he has quit smoking. His smoking use included Cigarettes. He has a 40 pack-year smoking history. He does not have any smokeless tobacco history on file. He reports that he drinks alcohol. He reports that he uses illicit drugs (Marijuana).  Allergies:  Allergies  Allergen Reactions  . Penicillins Swelling  . Sulfonamide Derivatives Rash    Medications Prior to Admission  Medication Sig Dispense Refill  . ALPRAZolam (XANAX) 0.25 MG tablet Take 0.5 mg by mouth at bedtime as needed.       . citalopram (CELEXA) 20 MG tablet Take 1 tablet by mouth every morning.       Marland Kitchen JANUMET 50-1000 MG per tablet Take 1 tablet by mouth 2 (two) times daily with a meal.       . LANTUS SOLOSTAR 100 UNIT/ML injection  Inject 30 Units into the skin at bedtime. As directed      . lisinopril (PRINIVIL,ZESTRIL) 20 MG tablet Take 20 mg by mouth every morning.        . naproxen sodium (ANAPROX) 220 MG tablet Take 220 mg by mouth 2 (two) times daily as needed. For pain       . pravastatin (PRAVACHOL) 40 MG tablet Take 1 tablet by mouth Daily.      . Tamsulosin HCl (FLOMAX) 0.4 MG CAPS Take 1 tablet by mouth Daily.       Medications Prior to Admission  Medication Dose Route Frequency Provider Last Rate Last Dose  . 0.9 %  sodium chloride infusion   Intravenous Continuous Robet Leu, PA      . fludeoxyglucose F - 18 (FDG) injection 17.7 milli Curie  17.7 milli Curie Intravenous Once PRN Medication Radiologist   17.7 milli Curie at 06/09/11 [phone]  . vancomycin (VANCOCIN) IVPB 1000 mg/200 mL premix  1,000 mg Intravenous Once [employer]      .  DISCONTD: vancomycin (VANCOCIN) 1,000 mg in sodium chloride 0.9 % 500 mL IVPB  1,000 mg Intravenous Once Robet Leu, PA      . DISCONTD: vancomycin (VANCOCIN) 1,000 mg in sodium chloride 0.9 % 500 mL IVPB  1,000 mg Intravenous Once Robet Leu, PA        Results for orders placed during the hospital encounter of 06/10/11 (from the past 48 hour(s))  GLUCOSE, CAPILLARY     Status: Abnormal   Collection Time   06/10/11  7:45 AM      Component Value Range Comment   Glucose-Capillary 263 (*) 70 - 99 (mg/dL)    Nm Pet Image Initial (pi) Skull Base To Thigh  06/09/2011  *RADIOLOGY REPORT*  Clinical Data:  Initial staging for non-Hodgkins lymphoma.  INITIAL NUCLEAR MEDICINE FDG PET CT TUMOR IMAGING- (SKULL BASE THROUGH THIGHS)  Technique:  17.7 mCi F-18 FDG was injected intravenously via the right antecubital fossa.  Full-ring PET imaging was performed from the skull base through the mid-thighs 58  minutes after injection. CT data was obtained and used for attenuation correction and anatomic localization only.  (This was not acquired as a diagnostic CT examination.)   Fasting Blood Glucose:  205  Comparison: Neck CT 05/07/2011.  CTs of the abdomen and pelvis 12/29/2007.  Findings: The previously identified enlarged left level IIA lymph node has been surgically resected. Minimal residual hypermetabolic activity in this area (SUV max 4.3) is consistent with postsurgical change.  No hypermetabolic cervical lymph nodes are identified. There is mild asymmetric activity within the right nasopharynx (SUV max 5.4).  No focal abnormality is apparent in this region on the CT images.  Lymphoid tissue in Waldeyer's ring otherwise demonstrates symmetric activity which is within physiologic limits.  There is no hypermetabolic nodal activity within the chest, abdomen or pelvis.  However, there is a focal hypermetabolic lesion centrally in the right hepatic lobe.  This demonstrates an SUV max of 7.8 and corresponds with a low density lesion measuring approximately 2.4 x 3.1 cm on image 136.  No abnormality was apparent in this area on the 2009 abdominal CT.  No other hepatic lesions are identified.  There is no hypermetabolic activity within the spleen which is normal in size.  The adrenal glands, bones and lungs demonstrate normal metabolic activity.  The CT images demonstrate diffuse vascular calcifications status post CABG.  There is mild bilateral gynecomastia.  The adrenal glands appear normal.  The left kidney has been surgically resected.  The right kidney appears unremarkable.  Facet degenerative changes are noted in the lower lumbar spine.  IMPRESSION:  1.  Interval surgical resection of the left level IIA cervical lymph node. No hypermetabolic lymph nodes are demonstrated. 2.  Mildly asymmetric metabolic activity in the right nasopharynx is nonspecific and without definite focal lesion on CT. 3.  Hypermetabolic central liver lesion consistent with malignancy. This could be related to the patient's lymphoma or a different process.  Further evaluation with MRI without and with contrast  recommended. 4.  No other evidence of metastatic disease.  Original Report Authenticated By: Gerrianne Scale, M.D.    Review of Systems  Constitutional: Negative for fever.  Eyes: Negative for blurred vision.  Respiratory: Negative for cough.   Cardiovascular: Negative for chest pain.  Gastrointestinal: Negative for nausea, vomiting, abdominal pain and diarrhea.  Neurological: Negative for dizziness and headaches.    Temperature 97.7 F (36.5 C). Physical Exam  Constitutional: He is oriented to person, place,  and time. He appears well-developed and well-nourished.  HENT:  Head: Normocephalic.  Eyes: EOM are normal.  Neck: Normal range of motion.  Cardiovascular: Normal rate, regular rhythm and normal heart sounds.   No murmur heard. Respiratory: Effort normal and breath sounds normal. He has no wheezes.  GI: Soft. Bowel sounds are normal. He exhibits no mass. There is no tenderness.  Musculoskeletal: Normal range of motion.  Neurological: He is alert and oriented to person, place, and time.  Skin: Skin is warm and dry.     Assessment/Plan NHL; scheduled for PAC placement today for Chemo therapy Pt aware of procedure benefits and risks and agreeable to proceed. Consent signed.  Ashvin Adelson A 06/10/2011, 7:53 AM

## 2011-06-22 NOTE — Interval H&P Note (Cosign Needed)
History and Physical Interval Note: History reviewed from H& P from [facility] placement.  No new symptoms or changes in medical management since that time.  Patient denies any previous complication with moderate sedation. Denies any recent CP or SOB.  06/22/2011 11:01 AM  Frederick Foster  has presented today for surgery, with the diagnosis of * hepatic lesion. *  The various methods of treatment have been discussed with the patient and family. After consideration of risks, benefits and other options for treatment, the patient has consented to ultrasound guided liver lesion needle core biopsy under moderate sedation  as a surgical intervention to establish diagnosis.  The patients' history has been reviewed, patient examined, no change in status, stable for surgery.  I have reviewed the patients' chart and labs.  Questions were answered to the patient's satisfaction.  Written consent obtained.   CAMPBELL,PAMELA D

## 2011-06-25 ENCOUNTER — Other Ambulatory Visit (HOSPITAL_COMMUNITY): Payer: Self-pay | Admitting: Internal Medicine

## 2011-06-25 ENCOUNTER — Other Ambulatory Visit (HOSPITAL_COMMUNITY): Payer: Self-pay | Admitting: Physician Assistant

## 2011-06-25 ENCOUNTER — Encounter (HOSPITAL_COMMUNITY): Payer: Self-pay | Admitting: Pharmacy Technician

## 2011-06-25 DIAGNOSIS — R160 Hepatomegaly, not elsewhere classified: Secondary | ICD-10-CM

## 2011-06-28 ENCOUNTER — Ambulatory Visit (HOSPITAL_COMMUNITY)
Admission: RE | Admit: 2011-06-28 | Discharge: 2011-06-28 | Disposition: A | Discharge status: Home / Self Care | Payer: Medicare Other | Source: Ambulatory Visit | Attending: Internal Medicine | Admitting: Internal Medicine

## 2011-06-28 ENCOUNTER — Other Ambulatory Visit (HOSPITAL_COMMUNITY): Payer: Medicare Other

## 2011-06-28 ENCOUNTER — Other Ambulatory Visit: Payer: Self-pay | Admitting: Diagnostic Radiology

## 2011-06-28 VITALS — BP 159/77 | HR 76 | Temp 97.0°F | Resp 18 | Ht 72.0 in | Wt 250.0 lb

## 2011-06-28 DIAGNOSIS — C8589 Other specified types of non-Hodgkin lymphoma, extranodal and solid organ sites: Secondary | ICD-10-CM | POA: Insufficient documentation

## 2011-06-28 DIAGNOSIS — R160 Hepatomegaly, not elsewhere classified: Secondary | ICD-10-CM

## 2011-06-28 LAB — PROTIME-INR
INR: 0.92 (ref 0.00–1.49)
Prothrombin Time: 12.6 seconds (ref 11.6–15.2)

## 2011-06-28 LAB — GLUCOSE, CAPILLARY: Glucose-Capillary: 209 mg/dL — ABNORMAL HIGH (ref 70–99)

## 2011-06-28 LAB — CBC
HCT: 34.6 % — ABNORMAL LOW (ref 39.0–52.0)
MCHC: 35.3 g/dL (ref 30.0–36.0)
Platelets: 158 10*3/uL (ref 150–400)
RDW: 12.7 % (ref 11.5–15.5)
WBC: 6.7 10*3/uL (ref 4.0–10.5)

## 2011-06-28 MED ORDER — MIDAZOLAM HCL 5 MG/5ML IJ SOLN
INTRAMUSCULAR | Status: AC | PRN
  Administered 2011-06-28 (×3): 1 mg via INTRAVENOUS

## 2011-06-28 MED ORDER — MIDAZOLAM HCL 2 MG/2ML IJ SOLN
INTRAMUSCULAR | Status: AC
  Filled 2011-06-28: qty 4

## 2011-06-28 MED ORDER — HYDROCODONE-ACETAMINOPHEN 5-325 MG PO TABS
1.0000 | ORAL_TABLET | ORAL | Status: DC | PRN
  Filled 2011-06-28: qty 1

## 2011-06-28 MED ORDER — FENTANYL CITRATE 0.05 MG/ML IJ SOLN
INTRAMUSCULAR | Status: AC
  Filled 2011-06-28: qty 4

## 2011-06-28 MED ORDER — SODIUM CHLORIDE 0.9 % IV SOLN
INTRAVENOUS | Status: DC
  Administered 2011-06-28: 13:00:00 via INTRAVENOUS

## 2011-06-28 MED ORDER — FENTANYL CITRATE 0.05 MG/ML IJ SOLN
INTRAMUSCULAR | Status: DC | PRN
  Administered 2011-06-28: 50 ug via INTRAVENOUS
  Administered 2011-06-28 (×2): 25 ug via INTRAVENOUS

## 2011-06-28 NOTE — ED Notes (Signed)
On room air. Report given to [facility] nurse  on short stay.Bandaid on right abd. Small stain, dry and intact. Denies pain.

## 2011-06-28 NOTE — Procedures (Signed)
US guided liver biopsy.  3 cores and 2 FNAs.  No immediate complication.

## 2011-06-28 NOTE — H&P (Signed)
Frederick Foster is an 60 y.o. male.   Chief Complaint: liver lesion HPI: Patient with history of lymphoma and liver lesion noted on recent MRI presents today for elective US guided liver lesion biopsy.  Past Medical History  Diagnosis Date  . Mixed hyperlipidemia   . AV BLOCK   . HERNIA, VENTRAL   . CORONARY ARTERY BYPASS GRAFT, TWO VESSEL, HX OF   . CAROTID BRUIT   . Coronary artery disease   . Myocardial infarction     bypass surgery x 3, sees Dr. Ellyn Foster, saw last 05/13/2011  . Vein symptom     injury to left leg due to conveyor belt.  caused injury to veins in left leg/ s/p surgery   . Sinus congestion     has allergies  . AODM     janumet/lantus  . Chronic kidney disease     has one kidney.  had left kidney removed in 2011 for cancer  . Hiatal hernia     s/p hiatal hernia surgery 2010  . Cancer      kidney ca, also mass to left neck  . Anxiety     history of panic attacks  . Depression     takes celexa  . Lymphoma     Past Surgical History  Procedure Date  . Cholecystectomy   . Kidney surgery   . Hernia repair     2010  . Coronary artery bypass graft     x3 .  Dr. Ellyn Foster is present cardiologist  . Vein surgery     s/p injury to left leg from conveyor belt  . Cardiac catheterization     2009  . Portacath placement     No family history on file. Social History:  reports that he has quit smoking. His smoking use included Cigarettes. He has a 40 pack-year smoking history. He does not have any smokeless tobacco history on file. He reports that he drinks alcohol. He reports that he uses illicit drugs (Marijuana).  Allergies:  Allergies  Allergen Reactions  . Penicillins Swelling  . Sulfonamide Derivatives Rash    Medications Prior to Admission  Medication Sig Dispense Refill  . ALPRAZolam (XANAX) 0.25 MG tablet Take 0.25 mg by mouth at bedtime as needed.       . citalopram (CELEXA) 20 MG tablet Take 1 tablet by mouth every morning.       Marland Kitchen JANUMET 50-1000 MG  per tablet Take 1 tablet by mouth 2 (two) times daily with a meal.       . LANTUS SOLOSTAR 100 UNIT/ML injection Inject 30 Units into the skin at bedtime. As directed      . lisinopril (PRINIVIL,ZESTRIL) 20 MG tablet Take 20 mg by mouth every morning.        . pravastatin (PRAVACHOL) 40 MG tablet Take 1 tablet by mouth Daily.      . Tamsulosin HCl (FLOMAX) 0.4 MG CAPS Take 1 tablet by mouth Daily.       Medications Prior to Admission  Medication Dose Route Frequency Provider Last Rate Last Dose  . 0.9 %  sodium chloride infusion   Intravenous Continuous Frederick Pancoast, PA 20 mL/hr at 06/28/11 1309      Results for orders placed during the hospital encounter of 06/28/11 (from the past 48 hour(s))  GLUCOSE, CAPILLARY     Status: Abnormal   Collection Time   06/28/11 12:48 PM      Component Value Range Comment   Glucose-Capillary  209 (*) 70 - 99 (mg/dL)   CBC     Status: Abnormal   Collection Time   06/28/11 12:57 PM      Component Value Range Comment   WBC 6.7  4.0 - 10.5 (K/uL)    RBC 4.09 (*) 4.22 - 5.81 (MIL/uL)    Hemoglobin 12.2 (*) 13.0 - 17.0 (g/dL)    HCT 21.3 (*) 08.6 - 52.0 (%)    MCV 84.6  78.0 - 100.0 (fL)    MCH 29.8  26.0 - 34.0 (pg)    MCHC 35.3  30.0 - 36.0 (g/dL)    RDW 57.8  46.9 - 62.9 (%)    Platelets 158  150 - 400 (K/uL)   PROTIME-INR     Status: Normal   Collection Time   06/28/11 12:57 PM      Component Value Range Comment   Prothrombin Time 12.6  11.6 - 15.2 (seconds)    INR 0.92  0.00 - 1.49     No results found.  Review of Systems  Constitutional: Negative for fever and chills.  Respiratory: Negative for cough and shortness of breath.   Cardiovascular: Negative for chest pain.  Gastrointestinal: Negative for nausea, vomiting and abdominal pain.  Neurological: Negative for headaches.  Endo/Heme/Allergies: Does not bruise/bleed easily.    Blood pressure 193/86, pulse 58, temperature 97.4 F (36.3 C), temperature source Oral, resp. rate 20,  height 6' (1.829 m), weight 250 lb (113.399 kg), SpO2 99.00%. Physical Exam  Constitutional: He is oriented to person, place, and time. He appears well-developed and well-nourished.  Cardiovascular: Normal rate and regular rhythm.   Respiratory: Effort normal and breath sounds normal.  GI: Soft. Bowel sounds are normal. There is no tenderness.  Musculoskeletal: Normal range of motion. He exhibits no edema.  Neurological: He is alert and oriented to person, place, and time.     Assessment/Plan Patient with history of lymphoma and liver lesion; plan is for US guided liver lesion biopsy.  Frederick Foster,D KEVIN 06/28/2011, 1:25 PM

## 2011-06-28 NOTE — ED Notes (Signed)
Transferred to short stay with self on stretcher.

## 2011-06-29 NOTE — H&P (Signed)
Agree with PA note. 

## 2011-11-04 ENCOUNTER — Other Ambulatory Visit (HOSPITAL_COMMUNITY): Payer: Self-pay | Admitting: Internal Medicine

## 2011-11-04 DIAGNOSIS — C8590 Non-Hodgkin lymphoma, unspecified, unspecified site: Secondary | ICD-10-CM

## 2011-11-18 ENCOUNTER — Telehealth: Payer: Self-pay | Admitting: Cardiovascular Disease

## 2011-11-18 NOTE — Telephone Encounter (Signed)
C/o feet swelling, chest tightness per wife, pt is at oncology app currently. Pt is finishing chemo, had 2 unit transfusion last week and now c/o both feet feeling very swollen and chest tightness. Pt to go to er if worsens app made for tomorrow am with Tyrone Sage NP. Wife agreed to plan.

## 2011-11-18 NOTE — Telephone Encounter (Signed)
New msg Pt's wife called. She said over last week his feet has been swelling. Please call back to discuss

## 2011-11-19 ENCOUNTER — Ambulatory Visit (HOSPITAL_COMMUNITY): Payer: Medicare Other | Attending: Cardiovascular Disease

## 2011-11-19 ENCOUNTER — Encounter: Payer: Self-pay | Admitting: Nurse Practitioner

## 2011-11-19 ENCOUNTER — Other Ambulatory Visit: Payer: Self-pay

## 2011-11-19 ENCOUNTER — Ambulatory Visit (INDEPENDENT_AMBULATORY_CARE_PROVIDER_SITE_OTHER): Payer: Medicare Other | Admitting: Nurse Practitioner

## 2011-11-19 ENCOUNTER — Telehealth: Payer: Self-pay | Admitting: *Deleted

## 2011-11-19 VITALS — BP 142/60 | HR 46 | Ht 72.0 in | Wt 258.2 lb

## 2011-11-19 DIAGNOSIS — R079 Chest pain, unspecified: Secondary | ICD-10-CM

## 2011-11-19 DIAGNOSIS — I4892 Unspecified atrial flutter: Secondary | ICD-10-CM | POA: Insufficient documentation

## 2011-11-19 DIAGNOSIS — C8589 Other specified types of non-Hodgkin lymphoma, extranodal and solid organ sites: Secondary | ICD-10-CM | POA: Insufficient documentation

## 2011-11-19 DIAGNOSIS — R609 Edema, unspecified: Secondary | ICD-10-CM | POA: Insufficient documentation

## 2011-11-19 DIAGNOSIS — C649 Malignant neoplasm of unspecified kidney, except renal pelvis: Secondary | ICD-10-CM | POA: Insufficient documentation

## 2011-11-19 DIAGNOSIS — E119 Type 2 diabetes mellitus without complications: Secondary | ICD-10-CM | POA: Insufficient documentation

## 2011-11-19 DIAGNOSIS — Z951 Presence of aortocoronary bypass graft: Secondary | ICD-10-CM

## 2011-11-19 DIAGNOSIS — I10 Essential (primary) hypertension: Secondary | ICD-10-CM | POA: Insufficient documentation

## 2011-11-19 DIAGNOSIS — I4891 Unspecified atrial fibrillation: Secondary | ICD-10-CM

## 2011-11-19 DIAGNOSIS — R0609 Other forms of dyspnea: Secondary | ICD-10-CM | POA: Insufficient documentation

## 2011-11-19 DIAGNOSIS — R5381 Other malaise: Secondary | ICD-10-CM | POA: Insufficient documentation

## 2011-11-19 DIAGNOSIS — C8590 Non-Hodgkin lymphoma, unspecified, unspecified site: Secondary | ICD-10-CM

## 2011-11-19 DIAGNOSIS — R0989 Other specified symptoms and signs involving the circulatory and respiratory systems: Secondary | ICD-10-CM | POA: Insufficient documentation

## 2011-11-19 DIAGNOSIS — E7800 Pure hypercholesterolemia, unspecified: Secondary | ICD-10-CM | POA: Insufficient documentation

## 2011-11-19 HISTORY — DX: Unspecified atrial flutter: I48.92

## 2011-11-19 LAB — BASIC METABOLIC PANEL
BUN: 16 mg/dL (ref 6–23)
CO2: 24 mEq/L (ref 19–32)
Calcium: 8.8 mg/dL (ref 8.4–10.5)
Chloride: 98 mEq/L (ref 96–112)
Creat: 1.88 mg/dL — ABNORMAL HIGH (ref 0.50–1.35)
Glucose, Bld: 253 mg/dL — ABNORMAL HIGH (ref 70–99)
Potassium: 4.1 mEq/L (ref 3.5–5.3)
Sodium: 135 mEq/L (ref 135–145)

## 2011-11-19 LAB — CBC WITH DIFFERENTIAL/PLATELET
Basophils Absolute: 0.1 10*3/uL (ref 0.0–0.1)
Basophils Relative: 1 % (ref 0–1)
Eosinophils Absolute: 0.1 10*3/uL (ref 0.0–0.7)
Eosinophils Relative: 1 % (ref 0–5)
HCT: 28 % — ABNORMAL LOW (ref 39.0–52.0)
Hemoglobin: 9.3 g/dL — ABNORMAL LOW (ref 13.0–17.0)
Lymphocytes Relative: 23 % (ref 12–46)
Lymphs Abs: 1.3 10*3/uL (ref 0.7–4.0)
MCH: 27.9 pg (ref 26.0–34.0)
MCHC: 33.2 g/dL (ref 30.0–36.0)
MCV: 84.1 fL (ref 78.0–100.0)
Monocytes Absolute: 0.6 10*3/uL (ref 0.1–1.0)
Monocytes Relative: 11 % (ref 3–12)
Neutro Abs: 3.5 10*3/uL (ref 1.7–7.7)
Neutrophils Relative %: 64 % (ref 43–77)
Platelets: 203 10*3/uL (ref 150–400)
RBC: 3.33 MIL/uL — ABNORMAL LOW (ref 4.22–5.81)
RDW: 14.8 % (ref 11.5–15.5)
WBC: 5.5 10*3/uL (ref 4.0–10.5)

## 2011-11-19 LAB — BRAIN NATRIURETIC PEPTIDE: Brain Natriuretic Peptide: 161.7 pg/mL — ABNORMAL HIGH (ref 0.0–100.0)

## 2011-11-19 LAB — TSH: TSH: 2.913 u[IU]/mL (ref 0.350–4.500)

## 2011-11-19 MED ORDER — FUROSEMIDE 20 MG PO TABS: 40.0000 mg | ORAL_TABLET | Freq: Every day | ORAL | Status: DC

## 2011-11-19 MED ORDER — WARFARIN SODIUM 5 MG PO TABS: ORAL_TABLET | ORAL | Status: DC

## 2011-11-19 NOTE — Telephone Encounter (Signed)
Lori reviewed labs and ok to start Coumadin 5 mg daily and scheduled INR and CBC next week. Advised patient while here for echo, verbalized understanding

## 2011-11-19 NOTE — Progress Notes (Signed)
Frederick Foster Date of Birth: Feb 07, 1951 Medical Record #[id number]  History of Present Illness: [name] is seen today for a work in visit. He is seen for Dr. Eden Emms. He is a 61 year old male who has just finished chemo. He has known CAD with remote CABG back in 2009, complicated by sternal wound infection. His other problems include DM, HLD, HTN and obesity. He tells me he has had 3 kinds of cancer. He has had lymphoma, renal cell carcinoma and most currently has spots in his liver. He is getting his oncology care in [place] with Dr. Ubaldo Glassing. He has had an echo back in November of 2012 showing normal LV function. He did have mild LVH, no LAE, trace MR, trace TR and impaired relaxation. This study was done pre chemo.   He comes here today. He is here with his wife. He has not felt well for about 1 to 2 weeks. He is tired and more short of breath. He now has more swelling in his legs.  He was started on Lasix yesterday by oncology. He denies any palpitations. He has had some intermittent lightheadedness and dizziness. No syncope. Not aware of his rhythm. Has had his metoprolol cut back to 25 mg. He is currently on Cipro due to recurrent fevers. He is due to have a PET scan next week and then a decision will be made as to whether he needs further chemotherapy. He thinks his blood count is currently about 8 or 9. We do not have access to those values.  Current Outpatient Prescriptions on File Prior to Visit  Medication Sig Dispense Refill  . furosemide (LASIX) 20 MG tablet Take 2 tablets (40 mg total) by mouth daily. Daily for 7 days and then prn  30 tablet    . LANTUS SOLOSTAR 100 UNIT/ML injection Inject 40 Units into the skin 2 (two) times daily. As directed      . metoprolol (LOPRESSOR) 50 MG tablet Take 50 mg by mouth. 1/2 tablet daily      . sitaGLIPtin (JANUVIA) 100 MG tablet Take 100 mg by mouth. 1/2 daily      . Tamsulosin HCl (FLOMAX) 0.4 MG CAPS Take 1 tablet by mouth Daily.         Allergies  Allergen Reactions  . Penicillins Swelling  . Sulfonamide Derivatives Rash    Past Medical History  Diagnosis Date  . Mixed hyperlipidemia   . AV BLOCK   . HERNIA, VENTRAL   . CORONARY ARTERY BYPASS GRAFT, TWO VESSEL, HX OF   . CAROTID BRUIT   . Coronary artery disease   . Myocardial infarction     bypass surgery x 3, sees Dr. Ellyn Hack, saw last 05/13/2011  . Vein symptom     injury to left leg due to conveyor belt.  caused injury to veins in left leg/ s/p surgery   . Sinus congestion     has allergies  . AODM     janumet/lantus  . Chronic kidney disease     has one kidney.  had left kidney removed in 2011 for cancer  . Hiatal hernia     s/p hiatal hernia surgery 2010  . Cancer      kidney ca, also mass to left neck  . Anxiety     history of panic attacks  . Depression     takes celexa  . Lymphoma   . Atrial flutter 11/19/2011    Past Surgical History  Procedure Date  .  Cholecystectomy   . Kidney surgery   . Hernia repair     2010  . Coronary artery bypass graft     x3 .  Dr. Ellyn Hack is present cardiologist  . Vein surgery     s/p injury to left leg from conveyor belt  . Cardiac catheterization     2009  . Portacath placement     History  Smoking status  . Former Smoker -- 1.0 packs/day for 40 years  . Types: Cigarettes  Smokeless tobacco  . Not on file    History  Alcohol Use No    has not drank since 1991    History reviewed. No pertinent family history.  Review of Systems: The review of systems is positive for swelling dyspnea and fatigue.  All other systems were reviewed and are negative.  Physical Exam: BP 142/60  Pulse 46  Ht 6' (1.829 m)  Wt 258 lb 3.2 oz (117.119 kg)  BMI 35.02 kg/m2 Patient is very pleasant and in no acute distress. Skin is warm and dry. Color is a little sallow.  HEENT is unremarkable. Normocephalic/atraumatic. PERRL. Sclera are nonicteric. Neck is supple. No masses. No JVD. Lungs are fairly clear.  Cardiac exam shows an irregular rate and rhythm. His rate is slow. Abdomen is soft. Extremities are with 1+ edema bilaterally. Gait and ROM are intact. No gross neurologic deficits noted.   LABORATORY DATA: EKG today shows atrial flutter with a slow ventricular response. Tracing was reviewed with Dr. Eden Emms.   Lab Results  Component Value Date   WBC 6.7 06/28/2011   HGB 12.2* 06/28/2011   HCT 34.6* 06/28/2011   PLT 158 06/28/2011   GLUCOSE 174* 05/17/2011   ALT 11 05/17/2011   AST 14 05/17/2011   NA 132* 05/17/2011   K 4.3 05/17/2011   CL 100 05/17/2011   CREATININE 1.68* 05/17/2011   BUN 20 05/17/2011   CO2 24 05/17/2011   INR 0.92 06/28/2011   HGBA1C 11.1* 05/16/2011    Assessment / Plan:

## 2011-11-19 NOTE — Assessment & Plan Note (Signed)
He has had no chest pain.

## 2011-11-19 NOTE — Assessment & Plan Note (Addendum)
Patient presents today with atrial flutter. His ventricular response is slow. He is symptomatic with swelling and dyspnea. Have discussed his case with Dr. Eden Emms. We will cut the metoprolol back even more to just 12.5 mg (1/4th of a tablet). We are updating his echo today to see if we have had changes from his chemo/cardiotoxicity. Would like to get EP to evaluate for possible TEE/ablation. We are rechecking his labs today and if his counts are satisfactory, we will start coumadin anticoagulation. I will tentatively see him back in a week. I have increased his Lasix to 40 mg daily. Patient is agreeable to this plan and will call if any problems develop in the interim.   Addendum: Lab Results  Component Value Date   WBC 5.5 11/19/2011   HGB 9.3* 11/19/2011   HCT 28.0* 11/19/2011   MCV 84.1 11/19/2011   PLT 203 11/19/2011   His CBC is reviewed. While he is anemic, he has no active bleeding. Will go ahead and initiate coumadin. Will need to follow his blood counts closely and recheck his CBC with his first INR.   Addendum 11/22/2011 Have reviewed Mr. Merlos case with Dr. Johney Frame today. He would prefer 3 weeks of therapeutic anticoagulation before proceeding on with ablation. We will both see Mr. Popper back in one week.

## 2011-11-19 NOTE — Patient Instructions (Addendum)
We need to repeat your echo here  We need to repeat some labs today.   I hope to start you on Coumadin later today  We need to cut the Metoprolol back to just 12.5 mg daily.  I want you to increase the Lasix to 40 mg daily for the next week  I am going to get you in to see the EP doctor next week to consider an ablation. I will see you in a week if they cannot see you.  Call the [facility] office at [phone] if you have any questions, problems or concerns.

## 2011-11-19 NOTE — Assessment & Plan Note (Signed)
He has apparently had lymphoma, renal cell carcinoma and sounds like liver cancer. He is for PET scanning next week with further disposition to be made regarding more chemo. His prognosis is not known.

## 2011-11-22 ENCOUNTER — Other Ambulatory Visit: Payer: Self-pay

## 2011-11-22 DIAGNOSIS — I10 Essential (primary) hypertension: Secondary | ICD-10-CM

## 2011-11-23 ENCOUNTER — Encounter (HOSPITAL_COMMUNITY)
Admission: RE | Admit: 2011-11-23 | Discharge: 2011-11-23 | Disposition: A | Discharge status: Home / Self Care | Payer: Medicare Other | Source: Ambulatory Visit | Attending: Internal Medicine | Admitting: Internal Medicine

## 2011-11-23 ENCOUNTER — Other Ambulatory Visit: Payer: Medicare Other

## 2011-11-23 ENCOUNTER — Encounter (HOSPITAL_COMMUNITY): Payer: Self-pay

## 2011-11-23 DIAGNOSIS — C8590 Non-Hodgkin lymphoma, unspecified, unspecified site: Secondary | ICD-10-CM

## 2011-11-23 DIAGNOSIS — N62 Hypertrophy of breast: Secondary | ICD-10-CM | POA: Insufficient documentation

## 2011-11-23 DIAGNOSIS — J90 Pleural effusion, not elsewhere classified: Secondary | ICD-10-CM | POA: Insufficient documentation

## 2011-11-23 DIAGNOSIS — R222 Localized swelling, mass and lump, trunk: Secondary | ICD-10-CM | POA: Insufficient documentation

## 2011-11-23 DIAGNOSIS — C8589 Other specified types of non-Hodgkin lymphoma, extranodal and solid organ sites: Secondary | ICD-10-CM | POA: Insufficient documentation

## 2011-11-23 LAB — GLUCOSE, CAPILLARY: Glucose-Capillary: 142 mg/dL — ABNORMAL HIGH (ref 70–99)

## 2011-11-23 MED ORDER — FLUDEOXYGLUCOSE F - 18 (FDG) INJECTION
18.4000 | Freq: Once | INTRAVENOUS | Status: AC | PRN
  Administered 2011-11-23: 18.4 via INTRAVENOUS

## 2011-11-24 ENCOUNTER — Encounter (INDEPENDENT_AMBULATORY_CARE_PROVIDER_SITE_OTHER): Payer: Medicare Other | Admitting: Internal Medicine

## 2011-11-24 DIAGNOSIS — C8589 Other specified types of non-Hodgkin lymphoma, extranodal and solid organ sites: Secondary | ICD-10-CM

## 2011-11-24 DIAGNOSIS — I2510 Atherosclerotic heart disease of native coronary artery without angina pectoris: Secondary | ICD-10-CM

## 2011-11-26 ENCOUNTER — Ambulatory Visit: Payer: Medicare Other | Admitting: Nurse Practitioner

## 2011-11-29 ENCOUNTER — Ambulatory Visit (INDEPENDENT_AMBULATORY_CARE_PROVIDER_SITE_OTHER): Payer: Medicare Other | Admitting: Nurse Practitioner

## 2011-11-29 ENCOUNTER — Encounter: Payer: Self-pay | Admitting: Nurse Practitioner

## 2011-11-29 VITALS — BP 138/76 | HR 57 | Ht 72.0 in | Wt 257.8 lb

## 2011-11-29 DIAGNOSIS — I4892 Unspecified atrial flutter: Secondary | ICD-10-CM

## 2011-11-29 DIAGNOSIS — Z79899 Other long term (current) drug therapy: Secondary | ICD-10-CM

## 2011-11-29 LAB — CBC WITH DIFFERENTIAL/PLATELET
Basophils Absolute: 0.1 10*3/uL (ref 0.0–0.1)
Basophils Relative: 1.4 % (ref 0.0–3.0)
Eosinophils Absolute: 0.2 10*3/uL (ref 0.0–0.7)
Eosinophils Relative: 3.5 % (ref 0.0–5.0)
HCT: 29 % — ABNORMAL LOW (ref 39.0–52.0)
Hemoglobin: 9.6 g/dL — ABNORMAL LOW (ref 13.0–17.0)
Lymphocytes Relative: 22.5 % (ref 12.0–46.0)
Lymphs Abs: 1 10*3/uL (ref 0.7–4.0)
MCHC: 33.2 g/dL (ref 30.0–36.0)
MCV: 87.3 fl (ref 78.0–100.0)
Monocytes Absolute: 0.7 10*3/uL (ref 0.1–1.0)
Monocytes Relative: 16.1 % — ABNORMAL HIGH (ref 3.0–12.0)
Neutro Abs: 2.6 10*3/uL (ref 1.4–7.7)
Neutrophils Relative %: 56.5 % (ref 43.0–77.0)
Platelets: 211 10*3/uL (ref 150.0–400.0)
RBC: 3.33 Mil/uL — ABNORMAL LOW (ref 4.22–5.81)
RDW: 17.5 % — ABNORMAL HIGH (ref 11.5–14.6)
WBC: 4.6 10*3/uL (ref 4.5–10.5)

## 2011-11-29 LAB — BASIC METABOLIC PANEL
BUN: 16 mg/dL (ref 6–23)
CO2: 26 mEq/L (ref 19–32)
Calcium: 8.6 mg/dL (ref 8.4–10.5)
Chloride: 104 mEq/L (ref 96–112)
Creatinine, Ser: 1.7 mg/dL — ABNORMAL HIGH (ref 0.4–1.5)
GFR: 44.16 mL/min — ABNORMAL LOW (ref >60.00)
Glucose, Bld: 234 mg/dL — ABNORMAL HIGH (ref 70–99)
Potassium: 4 mEq/L (ref 3.5–5.1)
Sodium: 138 mEq/L (ref 135–145)

## 2011-11-29 LAB — PROTIME-INR
INR: 1.1 ratio — ABNORMAL HIGH (ref 0.8–1.0)
Prothrombin Time: 11.9 s (ref 10.2–12.4)

## 2011-11-29 NOTE — Assessment & Plan Note (Addendum)
Patient is subsequently seen with Dr. Johney Frame. Treatment options have been introduced to [name] to include cardioversion versus ablation. Will need to continue the coumadin. Will let him have his INR's at Dr. Kathi Der office weekly. Once he has been therapeutic for 4 weeks, he is willing to proceed on with ablation. Metoprolol will be held 48 hours prior to that procedure. The procedure, risks and benefits have been discussed with the patient and his wife. He is willing to proceed. For now, no other changes in his medicines. We are rechecking labs today as well. Patient is agreeable to this plan and will call if any problems develop in the interim.    The patient was seen, examined, and full history reviewed by me in detail today. Therapeutic strategies for atrial flutter  including medicine and ablation were discussed in detail with the patient by me today. Risk, benefits, and alternatives to EP study and radiofrequency ablation for afib were also discussed in detail today. These risks include but are not limited to stroke, bleeding, vascular damage, tamponade, perforation, damage to the heart and other structures,AV block, worsening renal function, and death. The patient understands these risk and wishes to proceed.  We will therefore proceed with catheter ablation once the patient has been adequately anticoagulated. He is willing to consider block CTI and therefore I will ask the research team to evaluate as well.

## 2011-11-29 NOTE — Patient Instructions (Addendum)
We need for you to have your coumadin checked weekly. You can do this at Dr. Kathi Der. Once your coumadin has been good for at least 4 weeks (b/t 2-3) we will then arrange for an ablation with Dr. Johney Frame. His nurse, Tresa Endo will be calling you later this week to schedule.   Stay on your other medicines.  We are going to recheck your labs today.  Call the [facility] office at [phone] if you have any questions, problems or concerns.

## 2011-11-29 NOTE — Progress Notes (Signed)
Frederick Foster Date of Birth: August 21, 1950 Medical Record #[id number]  History of Present Illness: Frederick Foster is seen today for a follow up visit. He is seen for Frederick Foster and subsequently see with Frederick Foster today. He has known CAD with remote CABG back in 2009, complicated by sternal wound infection. His other issues include DM, HLD, HTN and obesity. He has had recurrent cancer and has just finished chemo. He has had lymphoma, renal cell carcinoma and most currently treatment for spots in his liver. He has had recent PET scan which showed no evidence of his lymphoma. He has had recent onset of swelling and shortness of breath and was noted to be in atrial flutter with a slow ventricular response last week. Beta blocker was cut back to just 12.5 mg daily. Coumadin was started. His echo was updated.   He comes in today. He is here with his wife. He continues to have some good days and bad days. Still with some shortness of breath and swelling. Hasn't really lost any weight with the lasix. His echo results are reviewed with him. PET scan results were noted. He is quite happy about this and does not have to see oncology for 3 months. He is tolerating the coumadin. No excessive bruising. No bleeding. He would like to have his INR's checked at Frederick Foster office. It is much closer in location.   Current Outpatient Prescriptions on File Prior to Visit  Medication Sig Dispense Refill  . ALPRAZolam (XANAX) 1 MG tablet Take 1 mg by mouth at bedtime as needed.      . ergocalciferol (VITAMIN D2) 50000 UNITS capsule Take 50,000 Units by mouth once a week.      Marland Kitchen LANTUS SOLOSTAR 100 UNIT/ML injection Inject 40 Units into the skin 2 (two) times daily. As directed      . lisinopril (PRINIVIL,ZESTRIL) 40 MG tablet Take 40 mg by mouth daily.      . metoprolol (LOPRESSOR) 50 MG tablet Take 25 mg by mouth daily. 1/2 tablet daily      . sitaGLIPtin (JANUVIA) 100 MG tablet Take 50 mg by mouth daily. 1/2 daily      .  Tamsulosin HCl (FLOMAX) 0.4 MG CAPS Take 0.4 mg by mouth Daily.       Marland Kitchen warfarin (COUMADIN) 5 MG tablet 1 daily or as directed  30 tablet  11  . DISCONTD: furosemide (LASIX) 20 MG tablet Take 2 tablets (40 mg total) by mouth daily. Daily for 7 days and then prn  30 tablet      Allergies  Allergen Reactions  . Penicillins Swelling  . Sulfonamide Derivatives Rash    Past Medical History  Diagnosis Date  . Mixed hyperlipidemia   . AV BLOCK   . HERNIA, VENTRAL   . CORONARY ARTERY BYPASS GRAFT, TWO VESSEL, HX OF   . CAROTID BRUIT   . Coronary artery disease   . Myocardial infarction     bypass surgery x 3 in 2009 complicated by sternal wound infection, sees Frederick Foster  . Vein symptom     injury to left leg due to conveyor belt.  caused injury to veins in left leg/ s/p surgery   . Sinus congestion     has allergies  . AODM   . Chronic kidney disease     has one kidney.  had left kidney removed in 2011 for cancer  . Hiatal hernia     s/p hiatal hernia surgery 2010  .  Cancer      kidney ca, also mass to left neck  . Anxiety     history of panic attacks  . Depression     takes celexa  . Lymphoma   . Atrial flutter 11/19/2011    Past Surgical History  Procedure Date  . Cholecystectomy   . Kidney surgery   . Hernia repair     2010  . Coronary artery bypass graft     x3 .  Frederick Foster is present cardiologist  . Vein surgery     s/p injury to left leg from conveyor belt  . Cardiac catheterization     2009  . Portacath placement     History  Smoking status  . Former Smoker -- 1.0 packs/day for 40 years  . Types: Cigarettes  Smokeless tobacco  . Not on file    History  Alcohol Use No    has not drank since 1991    History reviewed. No pertinent family history.  Review of Systems: The review of systems is positive for DOE and swelling. No palpitations. No syncope. No chest pain.  All other systems were reviewed and are negative.  Physical Exam: BP 138/76   Pulse 57  Ht 6' (1.829 m)  Wt 257 lb 12.8 oz (116.937 kg)  BMI 34.96 kg/m2 Patient is very pleasant and in no acute distress. He is obese. He actually looks like he feels a little better to me today. Skin is warm and dry. Color is more normal and not as sallow.  HEENT is unremarkable. Normocephalic/atraumatic. PERRL. Sclera are nonicteric. Neck is supple. No masses. No JVD. Lungs are clear. Cardiac exam shows an irregular rhythm.  Rate is controlled. Abdomen is soft. Extremities are with 1+ edema. Gait and ROM are intact. No gross neurologic deficits noted.  LABORATORY DATA:  Echo Study Conclusions from May 2013  - Left ventricle: The cavity size was normal. Wall thickness was increased in a pattern of moderate LVH. Systolic function was normal. The estimated ejection fraction was in the range of 60% to 65%. Wall motion was normal; there were no regional wall motion abnormalities. - Mitral valve: Mild regurgitation. - Left atrium: The atrium was moderately dilated. - Pulmonary arteries: Systolic pressure was mildly increased. PA peak pressure: 32mm Hg (S).   Lab Results  Component Value Date   WBC 5.5 11/19/2011   HGB 9.3* 11/19/2011   HCT 28.0* 11/19/2011   PLT 203 11/19/2011   GLUCOSE 253* 11/19/2011   ALT 11 05/17/2011   AST 14 05/17/2011   NA 135 11/19/2011   K 4.1 11/19/2011   CL 98 11/19/2011   CREATININE 1.88* 11/19/2011   BUN 16 11/19/2011   CO2 24 11/19/2011   TSH 2.913 11/19/2011   INR 0.92 06/28/2011   HGBA1C 11.1* 05/16/2011     Assessment / Plan:

## 2011-12-01 ENCOUNTER — Telehealth: Payer: Self-pay | Admitting: *Deleted

## 2011-12-01 NOTE — Telephone Encounter (Signed)
Left message for patient to call back.  Patient needs to see NP at [facility] Friday at 12:30

## 2011-12-02 NOTE — Telephone Encounter (Signed)
Pt rtn call and gave pt message, pt verbalized understanding, ask if any questions, pt said no

## 2011-12-03 ENCOUNTER — Telehealth: Payer: Self-pay | Admitting: *Deleted

## 2011-12-03 NOTE — Telephone Encounter (Signed)
Wife advised and should have seen Dr Kathi Der office for labs today

## 2011-12-03 NOTE — Telephone Encounter (Signed)
Message copied by Burnell Blanks on Fri Dec 03, 2011  5:40 PM ------      Message from: Rosalio Macadamia      Created: Mon Nov 29, 2011  1:03 PM       Ok to report. Renal function is basically unchanged. CBC looks a little better. Please clarify his coumadin. Should have been on 5 mg a day since his last visit. If this is correct, increase to 7.5 daily. Check INR on Friday at Dr. Kathi Der (patient wants to go there due to location). He will need weekly INR's due to plans for upcoming ablation.

## 2011-12-04 NOTE — Progress Notes (Signed)
Addended by: Hillis Range on: 12/04/2011 12:59 PM   Modules accepted: Level of Service

## 2011-12-15 ENCOUNTER — Encounter: Payer: Medicare Other | Admitting: Internal Medicine

## 2011-12-15 DIAGNOSIS — Z452 Encounter for adjustment and management of vascular access device: Secondary | ICD-10-CM

## 2011-12-15 DIAGNOSIS — C8589 Other specified types of non-Hodgkin lymphoma, extranodal and solid organ sites: Secondary | ICD-10-CM

## 2011-12-23 ENCOUNTER — Telehealth: Payer: Self-pay | Admitting: Cardiovascular Disease

## 2011-12-23 NOTE — Telephone Encounter (Signed)
New Problem:    [name] wife called in because he is swelling all over his body, is retaining fluid, has shortness of breath, has a lot of chest tightness, can barely walk any distance and his heart out of rhythm.  [name] wife would like to get his INR situation resolved so something could be done about his condition.  Please call back.

## 2011-12-23 NOTE — Telephone Encounter (Signed)
SPOKE WITH PT  IN GREAT LENGTH  CALLS WITH COMPLAINING OF SOB UNABLE TO WALK IN [place] WITHOUT  HAVING TO STOP AND REST IS AWAITING TO  HAVE  ABLATION DONE ONCE  INR'S ARE THERAPUTIC PER PT  HAS HAD  CHECKED THREE  TIMES READINGS AS FOLLOW 1.3 , 1.8 , and 1.2   PT AWARE WILL FORWARD TO  DR Johney Frame FOR REVIEW .Zack Seal

## 2011-12-24 NOTE — Telephone Encounter (Signed)
Discussed with Dr Johney Frame.  We will stop Coumadin and start Xarelto 20mg  daily(CrCl is 74.3)  And do a TEE the morning of the ablation.  I called the patient and he wishes to continue taking Coumadin over the weekend and see if his INR is above 2.0 on Monday.  I will arrange for him to have a TEE on the morning of his procedure and he will call me on Monday if INR is <2.0

## 2011-12-28 ENCOUNTER — Encounter (HOSPITAL_COMMUNITY): Payer: Self-pay

## 2011-12-30 ENCOUNTER — Encounter: Payer: Self-pay | Admitting: *Deleted

## 2011-12-30 ENCOUNTER — Other Ambulatory Visit: Payer: Self-pay | Admitting: *Deleted

## 2011-12-30 DIAGNOSIS — I4891 Unspecified atrial fibrillation: Secondary | ICD-10-CM

## 2012-01-03 ENCOUNTER — Encounter: Payer: Self-pay | Admitting: Cardiovascular Disease

## 2012-01-07 ENCOUNTER — Encounter (HOSPITAL_COMMUNITY)
Admission: RE | Disposition: A | Discharge status: Home / Self Care | Payer: Self-pay | Source: Ambulatory Visit | Attending: Internal Medicine

## 2012-01-07 ENCOUNTER — Encounter (HOSPITAL_COMMUNITY): Payer: Self-pay | Admitting: Anesthesiology

## 2012-01-07 ENCOUNTER — Encounter (HOSPITAL_COMMUNITY): Payer: Self-pay | Admitting: Gastroenterology

## 2012-01-07 ENCOUNTER — Ambulatory Visit (HOSPITAL_COMMUNITY): Payer: Medicare Other | Admitting: Anesthesiology

## 2012-01-07 ENCOUNTER — Ambulatory Visit (HOSPITAL_COMMUNITY)
Admission: RE | Admit: 2012-01-07 | Discharge: 2012-01-08 | Disposition: A | Discharge status: Home / Self Care | Payer: Medicare Other | Source: Ambulatory Visit | Attending: Internal Medicine | Admitting: Internal Medicine

## 2012-01-07 DIAGNOSIS — E119 Type 2 diabetes mellitus without complications: Secondary | ICD-10-CM | POA: Insufficient documentation

## 2012-01-07 DIAGNOSIS — Z951 Presence of aortocoronary bypass graft: Secondary | ICD-10-CM | POA: Insufficient documentation

## 2012-01-07 DIAGNOSIS — I4892 Unspecified atrial flutter: Secondary | ICD-10-CM

## 2012-01-07 DIAGNOSIS — I2510 Atherosclerotic heart disease of native coronary artery without angina pectoris: Secondary | ICD-10-CM | POA: Insufficient documentation

## 2012-01-07 DIAGNOSIS — I10 Essential (primary) hypertension: Secondary | ICD-10-CM | POA: Insufficient documentation

## 2012-01-07 DIAGNOSIS — E785 Hyperlipidemia, unspecified: Secondary | ICD-10-CM | POA: Insufficient documentation

## 2012-01-07 DIAGNOSIS — Z85528 Personal history of other malignant neoplasm of kidney: Secondary | ICD-10-CM | POA: Insufficient documentation

## 2012-01-07 DIAGNOSIS — Z7901 Long term (current) use of anticoagulants: Secondary | ICD-10-CM | POA: Insufficient documentation

## 2012-01-07 DIAGNOSIS — E669 Obesity, unspecified: Secondary | ICD-10-CM | POA: Insufficient documentation

## 2012-01-07 DIAGNOSIS — I4891 Unspecified atrial fibrillation: Secondary | ICD-10-CM

## 2012-01-07 DIAGNOSIS — I441 Atrioventricular block, second degree: Secondary | ICD-10-CM | POA: Insufficient documentation

## 2012-01-07 DIAGNOSIS — C8589 Other specified types of non-Hodgkin lymphoma, extranodal and solid organ sites: Secondary | ICD-10-CM | POA: Insufficient documentation

## 2012-01-07 HISTORY — PX: TEE WITHOUT CARDIOVERSION: SHX5443

## 2012-01-07 HISTORY — PX: ATRIAL FLUTTER ABLATION: SHX5733

## 2012-01-07 HISTORY — PX: CORONARY ANGIOPLASTY WITH STENT PLACEMENT: SHX49

## 2012-01-07 LAB — PROTIME-INR
INR: 1.99 — ABNORMAL HIGH (ref 0.00–1.49)
Prothrombin Time: 22.9 seconds — ABNORMAL HIGH (ref 11.6–15.2)

## 2012-01-07 LAB — GLUCOSE, CAPILLARY
Glucose-Capillary: 189 mg/dL — ABNORMAL HIGH (ref 70–99)
Glucose-Capillary: 162 mg/dL — ABNORMAL HIGH (ref 70–99)

## 2012-01-07 SURGERY — ECHOCARDIOGRAM, TRANSESOPHAGEAL
Anesthesia: Moderate Sedation

## 2012-01-07 SURGERY — ATRIAL FLUTTER ABLATION
Anesthesia: Monitor Anesthesia Care

## 2012-01-07 MED ORDER — MIDAZOLAM HCL 5 MG/5ML IJ SOLN
INTRAMUSCULAR | Status: DC | PRN
  Administered 2012-01-07 (×2): 1 mg via INTRAVENOUS

## 2012-01-07 MED ORDER — FENTANYL CITRATE 0.05 MG/ML IJ SOLN: 25.0000 ug | INTRAMUSCULAR | Status: DC | PRN

## 2012-01-07 MED ORDER — BENZOCAINE 20 % MT SOLN
1.0000 "application " | OROMUCOSAL | Status: DC | PRN
  Filled 2012-01-07: qty 57

## 2012-01-07 MED ORDER — SODIUM CHLORIDE 0.9 % IV SOLN: 250.0000 mL | INTRAVENOUS | Status: DC | PRN

## 2012-01-07 MED ORDER — FENTANYL CITRATE 0.05 MG/ML IJ SOLN: 250.0000 ug | Freq: Once | INTRAMUSCULAR | Status: DC

## 2012-01-07 MED ORDER — LINAGLIPTIN 5 MG PO TABS
5.0000 mg | ORAL_TABLET | Freq: Every day | ORAL | Status: DC
  Administered 2012-01-07 – 2012-01-08 (×2): 5 mg via ORAL
  Filled 2012-01-07 (×3): qty 1

## 2012-01-07 MED ORDER — LACTATED RINGERS IV SOLN
INTRAVENOUS | Status: DC | PRN
  Administered 2012-01-07 (×2): via INTRAVENOUS

## 2012-01-07 MED ORDER — PROPOFOL 10 MG/ML IV EMUL
INTRAVENOUS | Status: DC | PRN
  Administered 2012-01-07: 50 mg via INTRAVENOUS

## 2012-01-07 MED ORDER — LISINOPRIL 40 MG PO TABS
40.0000 mg | ORAL_TABLET | Freq: Every day | ORAL | Status: DC
  Administered 2012-01-08: 40 mg via ORAL
  Filled 2012-01-07: qty 1

## 2012-01-07 MED ORDER — BENZOCAINE 20 % MT SOLN: 1.0000 "application " | OROMUCOSAL | Status: DC | PRN

## 2012-01-07 MED ORDER — PROPOFOL 10 MG/ML IV EMUL
INTRAVENOUS | Status: DC | PRN
  Administered 2012-01-07: 25 ug/kg/min via INTRAVENOUS

## 2012-01-07 MED ORDER — ACETAMINOPHEN 325 MG PO TABS
650.0000 mg | ORAL_TABLET | ORAL | Status: DC | PRN
Start: 2012-01-07 — End: 2012-01-08

## 2012-01-07 MED ORDER — MIDAZOLAM HCL 10 MG/2ML IJ SOLN: 10.0000 mg | Freq: Once | INTRAMUSCULAR | Status: DC

## 2012-01-07 MED ORDER — CITALOPRAM HYDROBROMIDE 20 MG PO TABS
20.0000 mg | ORAL_TABLET | Freq: Every day | ORAL | Status: DC
  Administered 2012-01-08: 20 mg via ORAL
  Filled 2012-01-07: qty 1

## 2012-01-07 MED ORDER — HYDROCODONE-ACETAMINOPHEN 5-325 MG PO TABS: 1.0000 | ORAL_TABLET | ORAL | Status: DC | PRN

## 2012-01-07 MED ORDER — SODIUM CHLORIDE 0.9 % IJ SOLN: 3.0000 mL | INTRAMUSCULAR | Status: DC | PRN

## 2012-01-07 MED ORDER — PROMETHAZINE HCL 25 MG/ML IJ SOLN
6.2500 mg | INTRAMUSCULAR | Status: DC | PRN
  Filled 2012-01-07: qty 1

## 2012-01-07 MED ORDER — INSULIN GLARGINE 100 UNIT/ML ~~LOC~~ SOLN
40.0000 [IU] | Freq: Two times a day (BID) | SUBCUTANEOUS | Status: DC
  Administered 2012-01-07: 40 [IU] via SUBCUTANEOUS

## 2012-01-07 MED ORDER — SUCCINYLCHOLINE CHLORIDE 20 MG/ML IJ SOLN
INTRAMUSCULAR | Status: DC | PRN
  Administered 2012-01-07: 120 mg via INTRAVENOUS

## 2012-01-07 MED ORDER — FENTANYL CITRATE 0.05 MG/ML IJ SOLN
INTRAMUSCULAR | Status: DC | PRN
  Administered 2012-01-07 (×3): 25 ug via INTRAVENOUS

## 2012-01-07 MED ORDER — SODIUM CHLORIDE 0.9 % IJ SOLN
3.0000 mL | Freq: Two times a day (BID) | INTRAMUSCULAR | Status: DC
  Administered 2012-01-07: 3 mL via INTRAVENOUS

## 2012-01-07 MED ORDER — ONDANSETRON HCL 4 MG/2ML IJ SOLN
INTRAMUSCULAR | Status: DC | PRN
  Administered 2012-01-07: 4 mg via INTRAVENOUS

## 2012-01-07 MED ORDER — DIPHENHYDRAMINE HCL 50 MG/ML IJ SOLN
INTRAMUSCULAR | Status: AC
  Filled 2012-01-07: qty 1

## 2012-01-07 MED ORDER — SODIUM CHLORIDE 0.9 % IV SOLN
INTRAVENOUS | Status: DC
  Administered 2012-01-07: 500 mL via INTRAVENOUS

## 2012-01-07 MED ORDER — MIDAZOLAM HCL 10 MG/2ML IJ SOLN
INTRAMUSCULAR | Status: AC
  Filled 2012-01-07: qty 2

## 2012-01-07 MED ORDER — TAMSULOSIN HCL 0.4 MG PO CAPS
0.4000 mg | ORAL_CAPSULE | Freq: Every day | ORAL | Status: DC
  Administered 2012-01-08: 0.4 mg via ORAL
  Filled 2012-01-07: qty 1

## 2012-01-07 MED ORDER — HEPARIN SODIUM (PORCINE) 1000 UNIT/ML IJ SOLN
INTRAMUSCULAR | Status: AC
  Filled 2012-01-07: qty 1

## 2012-01-07 MED ORDER — BUTAMBEN-TETRACAINE-BENZOCAINE 2-2-14 % EX AERO
INHALATION_SPRAY | CUTANEOUS | Status: DC | PRN
  Administered 2012-01-07: 2 via TOPICAL

## 2012-01-07 MED ORDER — RIVAROXABAN 20 MG PO TABS
20.0000 mg | ORAL_TABLET | Freq: Every day | ORAL | Status: DC
  Administered 2012-01-08: 20 mg via ORAL
  Filled 2012-01-07 (×2): qty 1

## 2012-01-07 MED ORDER — MIDAZOLAM HCL 10 MG/2ML IJ SOLN
INTRAMUSCULAR | Status: DC | PRN
  Administered 2012-01-07 (×3): 2 mg via INTRAVENOUS

## 2012-01-07 MED ORDER — ONDANSETRON HCL 4 MG/2ML IJ SOLN
4.0000 mg | Freq: Four times a day (QID) | INTRAMUSCULAR | Status: DC | PRN
  Administered 2012-01-07: 4 mg via INTRAVENOUS
  Filled 2012-01-07 (×2): qty 2

## 2012-01-07 MED ORDER — FENTANYL CITRATE 0.05 MG/ML IJ SOLN
INTRAMUSCULAR | Status: DC | PRN
  Administered 2012-01-07: 100 ug via INTRAVENOUS
  Administered 2012-01-07 (×2): 50 ug via INTRAVENOUS
  Administered 2012-01-07: 25 ug via INTRAVENOUS

## 2012-01-07 MED ORDER — FENTANYL CITRATE 0.05 MG/ML IJ SOLN
INTRAMUSCULAR | Status: AC
  Filled 2012-01-07: qty 2

## 2012-01-07 MED ORDER — PHENYLEPHRINE HCL 10 MG/ML IJ SOLN
INTRAMUSCULAR | Status: DC | PRN
  Administered 2012-01-07: 40 ug via INTRAVENOUS

## 2012-01-07 MED ORDER — SODIUM CHLORIDE 0.9 % IV SOLN: 250.0000 mL | INTRAVENOUS | Status: AC | PRN

## 2012-01-07 NOTE — Brief Op Note (Signed)
01/07/2012  3:56 PM  PATIENT:  Frederick Foster  61 y.o. male  PRE-OPERATIVE DIAGNOSIS:  atrial flutter  POST-OPERATIVE DIAGNOSIS:  * No post-op diagnosis entered *  PROCEDURE:  Procedure(s) (LRB): ATRIAL FLUTTER ABLATION (N/A)  SURGEON:  Surgeon(s) and Role:    * Hillis Range, MD - Primary  PHYSICIAN ASSISTANT:   ASSISTANTS: none   ANESTHESIA:   general  EBL:  Total I/O In: 1000 [I.V.:1000] Out: -   BLOOD ADMINISTERED:none  DRAINS: none   LOCAL MEDICATIONS USED:  LIDOCAINE   SPECIMEN:  No Specimen  DISPOSITION OF SPECIMEN:  N/A  COUNTS:  YES  TOURNIQUET:  * No tourniquets in log *  DICTATION: .Other Dictation: Dictation Number [id number]  PLAN OF CARE: Admit for overnight observation  PATIENT DISPOSITION:  PACU - hemodynamically stable.   Delay start of Pharmacological VTE agent (>24hrs) due to surgical blood loss or risk of bleeding: no

## 2012-01-07 NOTE — Anesthesia Preprocedure Evaluation (Addendum)
Anesthesia Evaluation  Patient identified by MRN, date of birth, ID band Patient awake    Reviewed: Allergy & Precautions, H&P , NPO status , [name] Chart, lab work & pertinent test results, reviewed documented beta blocker date and time   History of Anesthesia Complications Negative for: history of anesthetic complications  Airway Mallampati: II  Neck ROM: Full    Dental  (+) Partial Lower, Edentulous Upper and Dental Advisory Given   Pulmonary shortness of breath and with exertion,    + decreased breath sounds      Cardiovascular hypertension, Pt. on medications + CAD, + Past MI, + CABG and + DOE + dysrhythmias Rhythm:Regular Rate:Bradycardia     Neuro/Psych PSYCHIATRIC DISORDERS Anxiety Depression  Neuromuscular disease    GI/Hepatic hiatal hernia,   Endo/Other  Diabetes mellitus-, Type 1, Insulin Dependent  Renal/GU      Musculoskeletal   Abdominal (+) + obese,   Peds  Hematology   Anesthesia Other Findings   Reproductive/Obstetrics                          Anesthesia Physical Anesthesia Plan  ASA: III  Anesthesia Plan: MAC   Post-op Pain Management:    Induction: Intravenous  Airway Management Planned: Mask  Additional Equipment:   Intra-op Plan:   Post-operative Plan:   Informed Consent: I have reviewed the patients History and Physical, chart, labs and discussed the procedure including the risks, benefits and alternatives for the proposed anesthesia with the patient or authorized representative who has indicated his/her understanding and acceptance.   Dental advisory given  Plan Discussed with: CRNA and Surgeon  Anesthesia Plan Comments:        Anesthesia Quick Evaluation

## 2012-01-07 NOTE — H&P (Signed)
Frederick Foster  Date of Birth: [birth year]  Medical Record #[id number]  History of Present Illness:  The patient presents today for planned atrial flutter ablation.. He has known CAD with remote CABG back in 2009, complicated by sternal wound infection. His other issues include DM, HLD, HTN and obesity. He has had recurrent cancer and has just finished chemo. He has had lymphoma, renal cell carcinoma and most currently treatment for spots in his liver. He has had recent PET scan which showed no evidence of his lymphoma. He has had recent onset of swelling and shortness of breath and was noted to be in atrial flutter with a slow ventricular response last week. Beta blocker was cut back to just 12.5 mg daily. Coumadin was started. His echo was updated.  Due to subtherapeutic INRs, his coumadin has been switched to xarelto.     Meds Reviewed   Allergies   Allergen  Reactions   .  Penicillins  Swelling   .  Sulfonamide Derivatives  Rash    Past Medical History   Diagnosis  Date   .  Mixed hyperlipidemia    .  HERNIA, VENTRAL    .  CORONARY ARTERY BYPASS GRAFT, TWO VESSEL, HX OF    .  CAROTID BRUIT    .  Coronary artery disease    .  Myocardial infarction      bypass surgery x 3 in 2009 complicated by sternal wound infection, sees Dr. Eden Emms   .  Vein symptom      injury to left leg due to conveyor belt. caused injury to veins in left leg/ s/p surgery   .  Sinus congestion      has allergies   .  AODM    .  Chronic kidney disease      has one kidney. had left kidney removed in 2011 for cancer   .  Hiatal hernia      s/p hiatal hernia surgery 2010   .  Cancer      kidney ca, also mass to left neck   .  Anxiety      history of panic attacks   .  Depression      takes celexa   .  Lymphoma    .  Atrial flutter  11/19/2011    Past Surgical History   Procedure  Date   .  Cholecystectomy    .  Kidney surgery    .  Hernia repair      2010   .  Coronary artery bypass graft      x3  . Dr. Ellyn Hack is present cardiologist   .  Vein surgery      s/p injury to left leg from conveyor belt   .  Cardiac catheterization      2009   .  Portacath placement     History   Smoking status   .  Former Smoker -- 1.0 packs/day for 40 years   .  Types:  Cigarettes   Smokeless tobacco   .  Not on file    History   Alcohol Use  No      has not drank since 1991    History reviewed. No pertinent family history.  Review of Systems:  The review of systems is positive for DOE and swelling. No palpitations. No syncope. No chest pain. All other systems were reviewed and are negative.  Physical Exam:  BP 138/76  Pulse 57  Ht 6' (1.829  m)  Wt 257 lb 12.8 oz (116.937 kg)  BMI 34.96 kg/m2  Patient is very pleasant and in no acute distress. He is obese. He actually looks like he feels a little better to me today. Skin is warm and dry. Color is more normal and not as sallow. HEENT is unremarkable. Normocephalic/atraumatic. PERRL. Sclera are nonicteric. Neck is supple. No masses. No JVD. Lungs are clear. Cardiac exam shows an irregular rhythm. Rate is controlled. Abdomen is soft. Extremities are with 1+ edema. Gait and ROM are intact. No gross neurologic deficits noted.  LABORATORY DATA:  Echo Study Conclusions from May 2013  - Left ventricle: The cavity size was normal. Wall thickness was increased in a pattern of moderate LVH. Systolic function was normal. The estimated ejection fraction was in the range of 60% to 65%. Wall motion was normal; there were no regional wall motion abnormalities. - Mitral valve: Mild regurgitation. - Left atrium: The atrium was moderately dilated. - Pulmonary arteries: Systolic pressure was mildly increased. PA peak pressure: 32mm Hg (S).  Lab Results   Component  Value  Date    WBC  5.5  11/19/2011    HGB  9.3*  11/19/2011    HCT  28.0*  11/19/2011    PLT  203  11/19/2011    GLUCOSE  253*  11/19/2011    ALT  11  05/17/2011    AST  14  05/17/2011    NA   135  11/19/2011    K  4.1  11/19/2011    CL  98  11/19/2011    CREATININE  1.88*  11/19/2011    BUN  16  11/19/2011    CO2  24  11/19/2011    TSH  2.913  11/19/2011    INR  0.92  06/28/2011    HGBA1C  11.1*  05/16/2011    Assessment / Plan:   Therapeutic strategies for atrial flutter including medicine and ablation were discussed in detail with the patient by me today. Risk, benefits, and alternatives to EP study and radiofrequency ablation for afib were also discussed in detail today. These risks include but are not limited to stroke, bleeding, vascular damage, tamponade, perforation, damage to the heart and other structures,AV block, worsening renal function, and death. The patient understands these risk and wishes to proceed. We will therefore proceed with catheter ablation once the patient has been adequately anticoagulated.  He is willing to proceed with block CTI research protochol.

## 2012-01-07 NOTE — Transfer of Care (Signed)
Immediate Anesthesia Transfer of Care Note  Patient: Frederick Foster  Procedure(s) Performed: Procedure(s) (LRB): ATRIAL FLUTTER ABLATION (N/A)  Patient Location: PACU and Cath Lab  Anesthesia Type: MAC and General  Level of Consciousness: awake, alert , oriented and patient cooperative  Airway & Oxygen Therapy: Patient Spontanous Breathing and Patient connected to nasal cannula oxygen  Post-op Assessment: Report given to PACU RN, Post -op Vital signs reviewed and stable and Patient moving all extremities  Post vital signs: Reviewed and stable  Complications: No apparent anesthesia complications

## 2012-01-07 NOTE — Preoperative (Signed)
Beta Blockers   Reason not to administer Beta Blockers:Not Applicable. No home beta blockers 

## 2012-01-07 NOTE — Anesthesia Procedure Notes (Signed)
Procedure Name: LMA Insertion Date/Time: 01/07/2012 1:35 PM Performed by: Garen Lah Pre-anesthesia Checklist: Patient identified, Emergency Drugs available, Suction available, Patient being monitored and Timeout performed Patient Re-evaluated:Patient Re-evaluated prior to inductionOxygen Delivery Method: Circle system utilized Preoxygenation: Pre-oxygenation with 100% oxygen Intubation Type: IV induction LMA: LMA with gastric port inserted LMA Size: 4.0 Number of attempts: 1 Placement Confirmation: positive ETCO2 and breath sounds checked- equal and bilateral Tube secured with: Tape Dental Injury: Teeth and Oropharynx as per pre-operative assessment

## 2012-01-07 NOTE — Op Note (Signed)
No LA or LAA thrombus  Full report to follow. 

## 2012-01-07 NOTE — Anesthesia Postprocedure Evaluation (Signed)
Anesthesia Post Note  Patient: Frederick Foster  Procedure(s) Performed: Procedure(s) (LRB): ATRIAL FLUTTER ABLATION (N/A)  Anesthesia type: General  Patient location: PACU  Post pain: Pain level controlled and Adequate analgesia  Post assessment: Post-op Vital signs reviewed, [name] Cardiovascular Status Stable, Respiratory Function Stable, Patent Airway and Pain level controlled  Last Vitals:  Filed Vitals:   01/07/12 1148  BP: 120/55  Pulse:   Temp:   Resp: 18    Post vital signs: Reviewed and stable  Level of consciousness: awake, alert  and oriented  Complications: No apparent anesthesia complications

## 2012-01-08 DIAGNOSIS — I517 Cardiomegaly: Secondary | ICD-10-CM

## 2012-01-08 DIAGNOSIS — I4892 Unspecified atrial flutter: Secondary | ICD-10-CM

## 2012-01-08 MED ORDER — RIVAROXABAN 20 MG PO TABS: 20.0000 mg | ORAL_TABLET | Freq: Every day | ORAL | Status: DC

## 2012-01-08 MED ORDER — CITALOPRAM HYDROBROMIDE 20 MG PO TABS: 20.0000 mg | ORAL_TABLET | Freq: Every day | ORAL | Status: DC

## 2012-01-08 MED ORDER — HYDROCODONE-ACETAMINOPHEN 5-325 MG PO TABS: 1.0000 | ORAL_TABLET | ORAL | Status: AC | PRN

## 2012-01-08 MED ORDER — ACETAMINOPHEN 325 MG PO TABS: 650.0000 mg | ORAL_TABLET | ORAL | Status: DC | PRN

## 2012-01-08 MED ORDER — INSULIN GLARGINE 100 UNIT/ML ~~LOC~~ SOLN: 40.0000 [IU] | Freq: Two times a day (BID) | SUBCUTANEOUS | Status: DC

## 2012-01-08 NOTE — Discharge Summary (Signed)
Physician Discharge Summary  Patient ID: Frederick Foster MRN: [id number] DOB/AGE: Nov 06, 1950 61 y.o.  Admit date: 01/07/2012 Discharge date: 01/08/2012  Primary Discharge Diagnosis: Atypical Appearing Atrial Flutter Secondary Discharge Diagnosis: 1. S/P EP study with ablation of Right Atrial flutter 2. CAD 3. CABG-2009 4. Diabetes 5. Hypertension 6. Hyperlipidemia  Significant Diagnostic Studies: 1. Comprehensive EP study.  2. Coronary sinus pacing and recording.  3. Mapping of atrial flutter.  4. Ablation of atrial flutter.  5. Arrhythmia induction with pacing.  Consults: None  Hospital Course: Frederick Foster is a 61 year old patient of Dr. Johney Frame we are following for history of atrial flutter with known history of diabetes hypertension hyperlipidemia and obesity along with CAD with remote CABG in 2009. The patient was admitted secondary to symptomatic atrial flutter. He had been treated with Clarnce Flock for anticoagulation, a transesophageal echocardiogram was performed revealing no left atrial appendage thrombus. He presented to a [facility] for EP study and radiofrequency ablation per Dr. Johney Frame on 01/07/2012. He underwent procedure per Dr. Johney Frame on 01/07/2012. Please see operative report per Dr. Johney Frame for more details. The patient tolerated procedure well and was seen by Dr. Myrtis Ser on discharge. He was mildly hypertensive with an intermittent Wenckebach rhythm. Echocardiogram was performed prior to discharge along with EKG. The patient will return home with a four-week followup. He will continue several toe as directed.   Discharge Exam: Blood pressure 163/89, pulse 107, temperature 98.4 F (36.9 C), temperature source Oral, resp. rate 15, height 6' (1.829 m), weight 264 lb 15.9 oz (120.2 kg), SpO2 95.00%.    Labs:   Lab Results  Component Value Date   WBC 4.6 11/29/2011   HGB 9.6* 11/29/2011   HCT 29.0* 11/29/2011   MCV 87.3 11/29/2011   PLT 211.0 11/29/2011         EKG: Intermittent  Wenckebach pattern.   FOLLOW UP PLANS AND APPOINTMENTS Discharge Orders    Future Orders Please Complete By Expires   Diet - low sodium heart healthy      Increase activity slowly      Discharge wound care:      Comments:   Will be changed in one week by Dr. Jenel Lucks office. Keep clean and dry.     Medication List  As of 01/08/2012  9:00 AM   TAKE these medications         acetaminophen 325 MG tablet   Commonly known as: TYLENOL   Take 2 tablets (650 mg total) by mouth every 4 (four) hours as needed.      ALPRAZolam 1 MG tablet   Commonly known as: XANAX   Take 1 mg by mouth at bedtime as needed. For sleep      amLODipine 5 MG tablet   Commonly known as: NORVASC   Take 5 mg by mouth daily.      citalopram 20 MG tablet   Commonly known as: CELEXA   Take 1 tablet (20 mg total) by mouth daily.      citalopram 20 MG tablet   Commonly known as: CELEXA   Take 20 mg by mouth daily.      ergocalciferol 50000 UNITS capsule   Commonly known as: VITAMIN D2   Take 50,000 Units by mouth once a week.      furosemide 20 MG tablet   Commonly known as: LASIX   Take 20 mg by mouth daily.      HYDROcodone-acetaminophen 5-325 MG per tablet   Commonly known as: NORCO  Take 1-2 tablets by mouth every 4 (four) hours as needed.      LANTUS SOLOSTAR 100 UNIT/ML injection   Generic drug: insulin glargine   Inject 40 Units into the skin 2 (two) times daily. As directed      insulin glargine 100 UNIT/ML injection   Commonly known as: LANTUS   Inject 40 Units into the skin 2 (two) times daily.      iron polysaccharides 150 MG capsule   Commonly known as: NIFEREX   Take 150 mg by mouth daily.      lisinopril 40 MG tablet   Commonly known as: PRINIVIL,ZESTRIL   Take 40 mg by mouth daily.      metoprolol tartrate 25 MG tablet   Commonly known as: LOPRESSOR   Take 12.5 mg by mouth daily.      pravastatin 40 MG tablet   Commonly known as: PRAVACHOL   Take 40 mg by mouth daily.        Rivaroxaban 20 MG Tabs   Commonly known as: XARELTO   Take 1 tablet (20 mg total) by mouth daily with breakfast.      XARELTO 20 MG Tabs   Generic drug: Rivaroxaban   Take 1 tablet by mouth daily.      sitaGLIPtin 50 MG tablet   Commonly known as: JANUVIA   Take 25 mg by mouth daily.      Tamsulosin HCl 0.4 MG Caps   Commonly known as: FLOMAX   Take 0.4 mg by mouth Daily.           Follow-up Information    Follow up with Hillis Range, MD. (Office will call you for appointment to be seen in 4 weeks.)    Contact information:   [address] [place] [address] [phone]         Time spent with patient to include physician time:40 minutes Signed: Joni Reining 01/08/2012, 9:00 AM Patient seen and examined. I agree with the assessment and plan as detailed above. See also my additional thoughts below.   Please see my complete progress note from today also. The patient is ready to be discharged home.  Willa Rough, MD, [facility] 01/09/2012 9:17 AM

## 2012-01-08 NOTE — Progress Notes (Signed)
  Echocardiogram 2D Echocardiogram has been performed.  Frederick Foster 01/08/2012, 10:09 AM

## 2012-01-08 NOTE — Progress Notes (Signed)
Patient ID: Frederick Foster, male   DOB: March 19, 1951, 61 y.o.   MRN: [id number]   SUBJECTIVE:  Patient is feeling well today after his flutter ablation. His rhythm is stable. He has sinus rhythm. There is some Wenckebach. He feels well.   Filed Vitals:   01/07/12 1950 01/08/12 0024 01/08/12 0429 01/08/12 0700  BP: 154/69 156/73 166/56 163/89  Pulse: 77 95 100 107  Temp: 97.6 F (36.4 C) 98.3 F (36.8 C) 98.2 F (36.8 C) 98.4 F (36.9 C)  TempSrc: Oral Oral Oral Oral  Resp: 17 14 14 15   Height:      Weight:  264 lb 15.9 oz (120.2 kg)    SpO2: 93% 93% 92% 95%    Intake/Output Summary (Last 24 hours) at 01/08/12 0836 Last data filed at 01/08/12 0825  Gross per 24 hour  Intake   1940 ml  Output   2300 ml  Net   -360 ml    LABS: Basic Metabolic Panel: No results found for this basename: NA:2,K:2,CL:2,CO2:2,GLUCOSE:2,BUN:2,CREATININE:2,CALCIUM:2,MG:2,PHOS:2 in the last 72 hours Liver Function Tests: No results found for this basename: AST:2,ALT:2,ALKPHOS:2,BILITOT:2,PROT:2,ALBUMIN:2 in the last 72 hours No results found for this basename: LIPASE:2,AMYLASE:2 in the last 72 hours CBC: No results found for this basename: WBC:2,NEUTROABS:2,HGB:2,HCT:2,MCV:2,PLT:2 in the last 72 hours Cardiac Enzymes: No results found for this basename: CKTOTAL:3,CKMB:3,CKMBINDEX:3,TROPONINI:3 in the last 72 hours BNP: No components found with this basename: POCBNP:3 D-Dimer: No results found for this basename: DDIMER:2 in the last 72 hours Hemoglobin A1C: No results found for this basename: HGBA1C in the last 72 hours Fasting Lipid Panel: No results found for this basename: CHOL,HDL,LDLCALC,TRIG,CHOLHDL,LDLDIRECT in the last 72 hours Thyroid Function Tests: No results found for this basename: TSH,T4TOTAL,FREET3,T3FREE,THYROIDAB in the last 72 hours  RADIOLOGY: No results found.  PHYSICAL EXAM  Patient is oriented to person time and place. Affect is normal. Lungs are clear. Respiratory effort  is nonlabored. Cardiac exam reveals S1 and S2. There no clicks or significant murmurs. Abdomen is soft. There's no peripheral edema.  TELEMETRY: I have personally reviewed telemetry on January 08, 2012. There is normal sinus rhythm. There is some limited 2-1 block that is Wenkebach   ASSESSMENT AND PLAN:  The patient is stable. He has some wenkebach noted. This does not represent a significant problem. He is stable to be discharged home.  Willa Rough 01/08/2012 8:36 AM

## 2012-01-08 NOTE — Op Note (Signed)
NAMEALDWIN, MICALIZZI NO.:  [id number]  MEDICAL RECORD NO.:  [id number]  LOCATION:  2508                         FACILITY:  [facility]  PHYSICIAN:  Hillis Range, MD       DATE OF BIRTH:  Apr 09, 1951  DATE OF PROCEDURE: DATE OF DISCHARGE:                              OPERATIVE REPORT   PREPROCEDURE DIAGNOSES: 1. Typical-appearing atrial flutter. 2. Bradycardia.  POSTPROCEDURE DIAGNOSES: 1. Counterclockwise isthmus-dependent right atrial flutter. 2. Mobitz I second-degree atrioventricular block.  PROCEDURES: 1. Comprehensive EP study. 2. Coronary sinus pacing and recording. 3. Mapping of atrial flutter. 4. Ablation of atrial flutter. 5. Arrhythmia induction with pacing.  INTRODUCTION:  Frederick Foster is a pleasant 61 year old gentleman who recently presented with symptomatic atrial flutter.  He has been treated with Xarelto for anticoagulation.  A transesophageal echocardiogram has been performed, which reveals no left atrial appendage thrombus.  He therefore presents today for EP study and radiofrequency ablation.  DESCRIPTION OF THE PROCEDURE:  Informed written consent was obtained and the patient was brought to the Electrophysiology Lab in the fasting state.  He was adequately sedated with general anesthesia as outlined in the anesthesia report.  The patient's right groin was prepped and draped in the usual sterile fashion by the EP Lab staff.  Using a percutaneous Seldinger technique, one 6-French, one 7-French, and one 8-French hemostasis sheaths were placed into the right common femoral vein.  A 6- French decapolar Polaris X catheter was introduced through the right common femoral vein and advanced into the coronary sinus for recording and pacing from this location.  A 6-French quadripolar Josephson catheter was introduced through the right common femoral vein and advanced into the right ventricle for recording and pacing.  This catheter was then pulled back  to the His bundle location.  The patient presented to the Electrophysiology Lab in typical-appearing atrial flutter.  The patient was quite bradycardic with atrial flutter with heart rates predominantly in the 30s.  His QRS duration measured 118 milliseconds with a QT interval of 556 milliseconds and an RR interval 1650 milliseconds.  His HV interval measured 54 milliseconds.  No infrahisian block was observed.  The atrial flutter cycle length was 240 milliseconds.  The coronary sinus activation sequence revealed proximal to distal activation and was suggestive of right atrial flutter.  A duodecapolar Halo catheter was introduced through the right common femoral vein and advanced into the right atrium.  This catheter was then positioned around the tricuspid valve anulus.  This demonstrated counterclockwise right atrial flutter. Entrainment was performed from the left atrium, which revealed a long postpacing interval when compared to the tachycardia cycle length. Entrainment was then performed from the cavotricuspid isthmus, which revealed a postpacing interval of only 20 milliseconds greater than the tachycardia cycle length.  The patient was therefore felt to have counterclockwise isthmus-dependent right atrial flutter.  I  elected to perform cavotricuspid isthmus ablation.  A [employer] conservation [employer] Open-Irrigated 4-mm investigational catheter with a large curve was introduced through the right common femoral vein and advanced into the right atrium.  This catheter was positioned along the cavotricuspid isthmus.  A series of radiofrequency applications were delivered with a target  temperature of 40 degrees with power between 20 and 40 Watts delivered.  The tachycardia was initially unaffected.  With the large curve catheter, I had some difficulty manipulating the ablation electrode along the isthmus.  I therefore elected to exchange the 8-French groin sheath for an 8.5-French RAMP  sheath.  The ablation catheter was readvanced through the ramp sheath.  Additional radiofrequency applications were delivered with a target temperature of 40 degrees with 20-40 watts delivered.  The tachycardia slowed and then terminated during ablation.  The patient remained in sinus rhythm thereafter with Wenckebach when pacing at a cycle length of 900 milliseconds.  There was no infrahisian block observed.  AV block was clearly Wenckebach within the AV node.  Differential atrial pacing was performed, which revealed that conduction persisted through the isthmus.  After a series of 13 radiofrequency applications had been delivered, the catheter at this point was difficulty to manipulate and had lost a significant amount of torquability.  I exchanged the ablation catheter for a [employer] Open-Irrigated asymmetric curve  catheter.  This catheter was positioned along the isthmus.  During the third radiofrequency application with this catheter, complete bidirectional cavotricuspid isthmus block was achieved as evident by differential atrial pacing from the low lateral right atrium.  This was confirmed with the Halo catheter in place with a stimulus to earliest activation recorded bidirectional across the isthmus measuring 160 milliseconds.  The patient was observed for 30 minutes without return of conduction through the isthmus.  Following ablation, the AH interval measured 124 milliseconds with an HV interval of 58 milliseconds. Again, the AV Wenckebach cycle length was 900 milliseconds and there was no evidence of infrahisian block observed.  Interestingly with ventricular pacing, midline concentric decremental VA conduction was observed with a VA Wenckebach cycle length of 300 milliseconds.  Rapid atrial pacing was again performed down to a cycle length of 250 milliseconds with no arrhythmias observed.  The procedure was therefore considered completed.  All catheters were  removed and the sheaths were aspirated and flushed.  The sheaths were removed and hemostasis was assured.  There were no early apparent complications.  CONCLUSIONS: 1. Counterclockwise isthmus-dependent right atrial flutter upon     presentation, successfully ablated along the usual cavotricuspid     isthmus. 2. Complete bidirectional cavotricuspid isthmus block achieved. 3. No inducible arrhythmias following ablation. 4. No early apparent complications.     Hillis Range, MD     JA/MEDQ  D:  01/07/2012  T:  01/08/2012  Job:  [id number]  cc:   Noralyn Pick. Eden Emms, MD, [facility] Dante Gang, N.P.

## 2012-01-08 NOTE — Progress Notes (Signed)
Escorted to private vehicle driven by patient's son, ambulatory, tolerated well, Berle Mull RN

## 2012-01-26 ENCOUNTER — Encounter: Payer: Medicare Other | Admitting: Internal Medicine

## 2012-01-26 DIAGNOSIS — C8589 Other specified types of non-Hodgkin lymphoma, extranodal and solid organ sites: Secondary | ICD-10-CM

## 2012-01-26 DIAGNOSIS — Z452 Encounter for adjustment and management of vascular access device: Secondary | ICD-10-CM

## 2012-02-09 ENCOUNTER — Ambulatory Visit (INDEPENDENT_AMBULATORY_CARE_PROVIDER_SITE_OTHER): Payer: Medicare Other | Admitting: Internal Medicine

## 2012-02-09 ENCOUNTER — Encounter: Payer: Self-pay | Admitting: Internal Medicine

## 2012-02-09 VITALS — BP 130/52 | HR 81 | Resp 18 | Ht 72.0 in | Wt 255.2 lb

## 2012-02-09 DIAGNOSIS — I5033 Acute on chronic diastolic (congestive) heart failure: Secondary | ICD-10-CM | POA: Insufficient documentation

## 2012-02-09 MED ORDER — ASPIRIN EC 81 MG PO TBEC: 81.0000 mg | DELAYED_RELEASE_TABLET | Freq: Every day | ORAL | Status: DC

## 2012-02-09 NOTE — Patient Instructions (Signed)
Your physician recommends that you schedule a follow-up appointment in: 2 months with Dr Eden Emms  Your physician has recommended you make the following change in your medication:  1) Stop Xarelto 2) Stop Furosemide---Weigh yourself daily 3) Start Aspirin 81mg  daily

## 2012-02-09 NOTE — Assessment & Plan Note (Signed)
Doing well s/p ablation Stop xarelto and resume ASA

## 2012-02-09 NOTE — Assessment & Plan Note (Signed)
Chronic and asymptomatic During EPS 01/07/12, all AV block was suprahisian. Avoid AV nodal agents,  No indication for pacemaker presently

## 2012-02-09 NOTE — Assessment & Plan Note (Signed)
Resolved s/p ablation for atrial flutter Daily weights and salt restriction Lasix only as needed given single kidney/ prior renal failure  Follow up with Dr Eden Emms in 2 months

## 2012-02-09 NOTE — Progress Notes (Signed)
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  Since his atrial flutter ablation, the patient reports doing very well. His swelling and SOB have resolved.  Today, he denies symptoms of palpitations, chest pain, dizziness, presyncope, or syncope.  The patient is otherwise without complaint today.   Past Medical History  Diagnosis Date  . Mixed hyperlipidemia   . 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 HERNIA, VENTRAL   . CORONARY ARTERY BYPASS GRAFT, TWO VESSEL, HX OF   . CAROTID BRUIT   . Coronary artery disease   . Myocardial infarction     bypass surgery x 3 in 2009 complicated by sternal wound infection, sees Dr. Eden Emms  . Vein symptom     injury to left leg due to conveyor belt.  caused injury to veins in left leg/ s/p surgery   . Sinus congestion     has allergies  . AODM   . Chronic kidney disease     has one kidney.  had left kidney removed in 2011 for cancer  . Hiatal hernia     s/p hiatal hernia surgery 2010  . Cancer      kidney ca, also mass to left neck  . Anxiety     history of panic attacks  . Depression     takes celexa  . Lymphoma   . Atrial flutter 11/19/2011   Past Surgical History  Procedure Date  . Cholecystectomy   . Kidney surgery   . Hernia repair     2010  . Coronary artery bypass graft     x3 .  Dr. Ellyn Hack is present cardiologist  . Vein surgery     s/p injury to left leg from conveyor belt  . Cardiac catheterization     2009  . Portacath placement   . Tee without cardioversion 01/07/2012    Procedure: TRANSESOPHAGEAL ECHOCARDIOGRAM (TEE);  Surgeon: Pricilla Riffle, MD;  Location: [facility] ENDOSCOPY;  Service: Cardiovascular;  Laterality: N/A;  . Atrial flutter ablation 01/07/12    CTI ablation by Dr Johney Frame    Current Outpatient Prescriptions  Medication Sig Dispense Refill  . ALPRAZolam  (XANAX) 1 MG tablet Take 1 mg by mouth at bedtime as needed. For sleep      . amLODipine (NORVASC) 5 MG tablet Take 5 mg by mouth daily.      . citalopram (CELEXA) 20 MG tablet Take 20 mg by mouth daily.      . ergocalciferol (VITAMIN D2) 50000 UNITS capsule Take 50,000 Units by mouth once a week.      . furosemide (LASIX) 20 MG tablet Take 20 mg by mouth daily.       Marland Kitchen HYDROcodone-acetaminophen (NORCO/VICODIN) 5-325 MG per tablet       . insulin detemir (LEVEMIR FLEXPEN) 100 UNIT/ML injection Inject 40 Units into the skin 2 (two) times daily.      Marland Kitchen linagliptin (TRADJENTA) 5 MG TABS tablet Take 5 mg by mouth daily.      Marland Kitchen lisinopril (PRINIVIL,ZESTRIL) 40 MG tablet Take 40 mg by mouth daily.      . metoprolol tartrate (LOPRESSOR) 25 MG tablet Take 12.5 mg by mouth daily.      Marland Kitchen acetaminophen (TYLENOL) 325 MG tablet Take 2 tablets (650 mg total) by mouth every 4 (four) hours as needed.  60 tablet  2  . iron polysaccharides (NIFEREX) 150 MG capsule Take 150 mg by mouth daily.      . pravastatin (PRAVACHOL) 40 MG tablet Take 40 mg by mouth daily.      . Rivaroxaban (XARELTO) 20 MG TABS Take 1 tablet by mouth daily.      . Rivaroxaban (XARELTO) 20 MG TABS Take 1 tablet (20 mg total) by mouth daily with breakfast.  30 tablet  1  . Tamsulosin HCl (FLOMAX) 0.4 MG CAPS Take 0.4 mg by mouth Daily.        No current facility-administered medications for this visit.   Facility-Administered Medications Ordered in Other Visits  Medication Dose Route Frequency Provider Last Rate Last Dose  . 0.9 %  sodium chloride infusion  250 mL Intravenous PRN Hillis Range, MD      . benzocaine (HURRICAINE) 20 % mouth spray 1 application  1 application Mouth/Throat PRN Hillis Range, MD      . fentaNYL (SUBLIMAZE) injection 250 mcg  250 mcg Intravenous Once Hillis Range, MD      . midazolam (VERSED) injection 10 mg  10 mg Intravenous Once Hillis Range, MD        Physical Exam: Filed Vitals:   02/09/12 0936  BP:  130/52  Pulse: 81  Resp: 18  Height: 6' (1.829 m)  Weight: 255 lb 3.2 oz (115.758 kg)  SpO2: 96%    GEN- The patient is well appearing, alert and oriented x 3 today.   Head- normocephalic, atraumatic Eyes-  Sclera clear, conjunctiva pink Ears- hearing intact Oropharynx- clear Lungs- Clear to ausculation bilaterally, normal work of breathing Heart- Regular rate and rhythm, no murmurs, rubs or gallops, PMI not laterally displaced GI- soft, NT, ND, + BS Extremities- no clubbing, cyanosis, trace edema  ekg today reveals sinus rhythm 91 bpm, PR 170, otherwise normal ekg  Assessment and Plan:

## 2012-02-22 NOTE — Addendum Note (Signed)
Addended by: Lacie Scotts on: 02/22/2012 05:44 PM   Modules accepted: Orders

## 2012-02-24 ENCOUNTER — Encounter: Payer: Medicare Other | Admitting: Internal Medicine

## 2012-03-01 ENCOUNTER — Encounter: Payer: Medicare Other | Admitting: Internal Medicine

## 2012-03-01 DIAGNOSIS — N189 Chronic kidney disease, unspecified: Secondary | ICD-10-CM

## 2012-03-01 DIAGNOSIS — E119 Type 2 diabetes mellitus without complications: Secondary | ICD-10-CM

## 2012-03-01 DIAGNOSIS — C8589 Other specified types of non-Hodgkin lymphoma, extranodal and solid organ sites: Secondary | ICD-10-CM

## 2012-03-08 DIAGNOSIS — C8589 Other specified types of non-Hodgkin lymphoma, extranodal and solid organ sites: Secondary | ICD-10-CM

## 2012-04-10 ENCOUNTER — Ambulatory Visit: Payer: Medicare Other | Admitting: Cardiovascular Disease

## 2012-04-19 DIAGNOSIS — Z452 Encounter for adjustment and management of vascular access device: Secondary | ICD-10-CM

## 2012-04-19 DIAGNOSIS — C8589 Other specified types of non-Hodgkin lymphoma, extranodal and solid organ sites: Secondary | ICD-10-CM

## 2012-04-25 ENCOUNTER — Inpatient Hospital Stay (HOSPITAL_COMMUNITY)
Admission: AD | Admit: 2012-04-25 | Discharge: 2012-04-26 | DRG: 309 | Disposition: A | Discharge status: Home / Self Care | Payer: Medicare Other | Source: Ambulatory Visit | Attending: Cardiovascular Disease | Admitting: Cardiovascular Disease

## 2012-04-25 ENCOUNTER — Encounter (HOSPITAL_COMMUNITY): Payer: Self-pay | Admitting: *Deleted

## 2012-04-25 ENCOUNTER — Encounter: Payer: Self-pay | Admitting: Cardiovascular Disease

## 2012-04-25 ENCOUNTER — Ambulatory Visit (INDEPENDENT_AMBULATORY_CARE_PROVIDER_SITE_OTHER): Payer: Medicare Other | Admitting: Cardiovascular Disease

## 2012-04-25 ENCOUNTER — Inpatient Hospital Stay (HOSPITAL_COMMUNITY): Payer: Medicare Other

## 2012-04-25 VITALS — BP 137/82 | HR 35 | Ht 72.0 in | Wt 257.0 lb

## 2012-04-25 DIAGNOSIS — I2510 Atherosclerotic heart disease of native coronary artery without angina pectoris: Secondary | ICD-10-CM | POA: Diagnosis present

## 2012-04-25 DIAGNOSIS — I5033 Acute on chronic diastolic (congestive) heart failure: Secondary | ICD-10-CM

## 2012-04-25 DIAGNOSIS — I4892 Unspecified atrial flutter: Secondary | ICD-10-CM

## 2012-04-25 DIAGNOSIS — I509 Heart failure, unspecified: Secondary | ICD-10-CM | POA: Diagnosis present

## 2012-04-25 DIAGNOSIS — Z7982 Long term (current) use of aspirin: Secondary | ICD-10-CM

## 2012-04-25 DIAGNOSIS — E782 Mixed hyperlipidemia: Secondary | ICD-10-CM | POA: Diagnosis present

## 2012-04-25 DIAGNOSIS — Z882 Allergy status to sulfonamides status: Secondary | ICD-10-CM

## 2012-04-25 DIAGNOSIS — I441 Atrioventricular block, second degree: Secondary | ICD-10-CM | POA: Diagnosis present

## 2012-04-25 DIAGNOSIS — Z880 Allergy status to penicillin: Secondary | ICD-10-CM

## 2012-04-25 DIAGNOSIS — I129 Hypertensive chronic kidney disease with stage 1 through stage 4 chronic kidney disease, or unspecified chronic kidney disease: Secondary | ICD-10-CM | POA: Diagnosis present

## 2012-04-25 DIAGNOSIS — I442 Atrioventricular block, complete: Secondary | ICD-10-CM | POA: Insufficient documentation

## 2012-04-25 DIAGNOSIS — I10 Essential (primary) hypertension: Secondary | ICD-10-CM

## 2012-04-25 DIAGNOSIS — Z794 Long term (current) use of insulin: Secondary | ICD-10-CM

## 2012-04-25 DIAGNOSIS — IMO0001 Reserved for inherently not codable concepts without codable children: Secondary | ICD-10-CM | POA: Diagnosis present

## 2012-04-25 DIAGNOSIS — N183 Chronic kidney disease, stage 3 unspecified: Secondary | ICD-10-CM

## 2012-04-25 DIAGNOSIS — Z79899 Other long term (current) drug therapy: Secondary | ICD-10-CM

## 2012-04-25 DIAGNOSIS — E785 Hyperlipidemia, unspecified: Secondary | ICD-10-CM | POA: Diagnosis present

## 2012-04-25 DIAGNOSIS — E119 Type 2 diabetes mellitus without complications: Secondary | ICD-10-CM

## 2012-04-25 DIAGNOSIS — Z951 Presence of aortocoronary bypass graft: Secondary | ICD-10-CM

## 2012-04-25 DIAGNOSIS — I498 Other specified cardiac arrhythmias: Secondary | ICD-10-CM | POA: Diagnosis present

## 2012-04-25 DIAGNOSIS — R001 Bradycardia, unspecified: Secondary | ICD-10-CM

## 2012-04-25 DIAGNOSIS — E1129 Type 2 diabetes mellitus with other diabetic kidney complication: Secondary | ICD-10-CM | POA: Diagnosis present

## 2012-04-25 DIAGNOSIS — I5032 Chronic diastolic (congestive) heart failure: Secondary | ICD-10-CM

## 2012-04-25 DIAGNOSIS — R0989 Other specified symptoms and signs involving the circulatory and respiratory systems: Secondary | ICD-10-CM

## 2012-04-25 HISTORY — DX: Chronic kidney disease, stage 3 (moderate): N18.3

## 2012-04-25 HISTORY — DX: Chronic kidney disease, stage 3 unspecified: N18.30

## 2012-04-25 LAB — CBC WITH DIFFERENTIAL/PLATELET
Lymphocytes Relative: 22 % (ref 12–46)
Lymphs Abs: 1.5 10*3/uL (ref 0.7–4.0)
Neutrophils Relative %: 67 % (ref 43–77)
Platelets: 153 10*3/uL (ref 150–400)
RBC: 3.77 MIL/uL — ABNORMAL LOW (ref 4.22–5.81)
WBC: 6.7 10*3/uL (ref 4.0–10.5)

## 2012-04-25 LAB — COMPREHENSIVE METABOLIC PANEL
ALT: 9 U/L (ref 0–53)
AST: 12 U/L (ref 0–37)
CO2: 25 mEq/L (ref 19–32)
Chloride: 98 mEq/L (ref 96–112)
GFR calc non Af Amer: 48 mL/min — ABNORMAL LOW (ref >90)
Sodium: 130 mEq/L — ABNORMAL LOW (ref 135–145)
Total Bilirubin: 0.6 mg/dL (ref 0.3–1.2)

## 2012-04-25 LAB — GLUCOSE, CAPILLARY: Glucose-Capillary: 288 mg/dL — ABNORMAL HIGH (ref 70–99)

## 2012-04-25 LAB — TROPONIN I
Troponin I: <0.3 ng/mL (ref <0.30)
Troponin I: <0.3 ng/mL (ref <0.30)

## 2012-04-25 LAB — PROTIME-INR: Prothrombin Time: 12.4 seconds (ref 11.6–15.2)

## 2012-04-25 LAB — TSH: TSH: 4.469 u[IU]/mL (ref 0.350–4.500)

## 2012-04-25 MED ORDER — TAMSULOSIN HCL 0.4 MG PO CAPS
0.4000 mg | ORAL_CAPSULE | Freq: Every day | ORAL | Status: DC
  Administered 2012-04-25 – 2012-04-26 (×2): 0.4 mg via ORAL
  Filled 2012-04-25 (×2): qty 1

## 2012-04-25 MED ORDER — ONDANSETRON HCL 4 MG/2ML IJ SOLN: 4.0000 mg | Freq: Four times a day (QID) | INTRAMUSCULAR | Status: DC | PRN

## 2012-04-25 MED ORDER — CITALOPRAM HYDROBROMIDE 20 MG PO TABS
20.0000 mg | ORAL_TABLET | Freq: Every day | ORAL | Status: DC
  Administered 2012-04-25 – 2012-04-26 (×2): 20 mg via ORAL
  Filled 2012-04-25 (×2): qty 1

## 2012-04-25 MED ORDER — ACETAMINOPHEN 325 MG PO TABS: 650.0000 mg | ORAL_TABLET | ORAL | Status: DC | PRN

## 2012-04-25 MED ORDER — SODIUM CHLORIDE 0.9 % IR SOLN
80.0000 mg | Status: AC
  Filled 2012-04-25: qty 2

## 2012-04-25 MED ORDER — ALPRAZOLAM 0.5 MG PO TABS: 1.0000 mg | ORAL_TABLET | Freq: Every evening | ORAL | Status: DC | PRN

## 2012-04-25 MED ORDER — NITROGLYCERIN 0.4 MG SL SUBL: 0.4000 mg | SUBLINGUAL_TABLET | SUBLINGUAL | Status: DC | PRN

## 2012-04-25 MED ORDER — ASPIRIN EC 81 MG PO TBEC
81.0000 mg | DELAYED_RELEASE_TABLET | Freq: Every day | ORAL | Status: DC
  Administered 2012-04-25 – 2012-04-26 (×2): 81 mg via ORAL
  Filled 2012-04-25 (×2): qty 1

## 2012-04-25 MED ORDER — INSULIN ASPART 100 UNIT/ML ~~LOC~~ SOLN
0.0000 [IU] | Freq: Three times a day (TID) | SUBCUTANEOUS | Status: DC
  Administered 2012-04-25: 8 [IU] via SUBCUTANEOUS

## 2012-04-25 MED ORDER — SODIUM CHLORIDE 0.9 % IJ SOLN: 3.0000 mL | Freq: Two times a day (BID) | INTRAMUSCULAR | Status: DC

## 2012-04-25 MED ORDER — HYDROCODONE-ACETAMINOPHEN 5-325 MG PO TABS: 1.0000 | ORAL_TABLET | Freq: Four times a day (QID) | ORAL | Status: DC | PRN

## 2012-04-25 MED ORDER — SODIUM CHLORIDE 0.9 % IV SOLN
250.0000 mL | INTRAVENOUS | Status: DC
  Administered 2012-04-25: 250 mL via INTRAVENOUS

## 2012-04-25 MED ORDER — VITAMIN D (ERGOCALCIFEROL) 1.25 MG (50000 UNIT) PO CAPS
50000.0000 [IU] | ORAL_CAPSULE | ORAL | Status: DC
  Administered 2012-04-25: 50000 [IU] via ORAL
  Filled 2012-04-25: qty 1

## 2012-04-25 MED ORDER — INSULIN DETEMIR 100 UNIT/ML ~~LOC~~ SOLN
40.0000 [IU] | Freq: Two times a day (BID) | SUBCUTANEOUS | Status: DC
  Administered 2012-04-25 – 2012-04-26 (×3): 40 [IU] via SUBCUTANEOUS
  Filled 2012-04-25 (×2): qty 10

## 2012-04-25 MED ORDER — SODIUM CHLORIDE 0.9 % IJ SOLN: 3.0000 mL | INTRAMUSCULAR | Status: DC | PRN

## 2012-04-25 MED ORDER — POLYSACCHARIDE IRON COMPLEX 150 MG PO CAPS
150.0000 mg | ORAL_CAPSULE | Freq: Every day | ORAL | Status: DC
  Administered 2012-04-26: 150 mg via ORAL
  Filled 2012-04-25 (×2): qty 1

## 2012-04-25 MED ORDER — LINAGLIPTIN 5 MG PO TABS
5.0000 mg | ORAL_TABLET | Freq: Every day | ORAL | Status: DC
  Administered 2012-04-25: 5 mg via ORAL
  Filled 2012-04-25 (×2): qty 1

## 2012-04-25 MED ORDER — SIMVASTATIN 20 MG PO TABS
20.0000 mg | ORAL_TABLET | Freq: Every day | ORAL | Status: DC
  Administered 2012-04-25: 20 mg via ORAL
  Filled 2012-04-25 (×2): qty 1

## 2012-04-25 MED ORDER — SODIUM CHLORIDE 0.9 % IV SOLN: 250.0000 mL | INTRAVENOUS | Status: DC | PRN

## 2012-04-25 MED ORDER — SODIUM CHLORIDE 0.45 % IV SOLN
INTRAVENOUS | Status: DC
  Administered 2012-04-26: 06:00:00 via INTRAVENOUS

## 2012-04-25 MED ORDER — VANCOMYCIN HCL IN DEXTROSE 1-5 GM/200ML-% IV SOLN
1000.0000 mg | INTRAVENOUS | Status: AC
  Filled 2012-04-25: qty 200

## 2012-04-25 NOTE — Assessment & Plan Note (Signed)
Cholesterol is at goal.  Continue current dose of statin and diet Rx.  No myalgias or side effects.  F/U  LFT's in 6 months. No results found for this basename: LDLCALC             

## 2012-04-25 NOTE — Assessment & Plan Note (Signed)
Admit.  Patient insists on driving over and will not take ambulance and no family to drive.  Risks and legal issues discussed and he is willing to take responsibility.  Stop norvasc and lopresser Telemetry  Dr Johney Frame to see in hospital ? Pace in 48 hours if high grade AV block persists Check lytes and echo

## 2012-04-25 NOTE — Assessment & Plan Note (Signed)
Not heard today needs F/U duplex 2011 0-39% disease

## 2012-04-25 NOTE — Assessment & Plan Note (Signed)
Euvolemic mild dyspna likely related to CHB  Check echo

## 2012-04-25 NOTE — Assessment & Plan Note (Signed)
Well controlled.  Continue current medications and low sodium Dash type diet.    

## 2012-04-25 NOTE — Progress Notes (Signed)
Patient ID: Frederick Foster, male   DOB: January 21, 1951, 61 y.o.   MRN: [id number] Frederick Foster returns today for followup.he is a previous patient of Dr. Samule Ohm. He had coronary bypass surgery by Dr. Lavinia Sharps in 2009. this was complicated by sternal wound infection. He has had his ventral hernia repair by Dr. Carolynne Edouard last 2011 . He had his left kidney removed last year. He needs neck surgery with Dr Pollyann Kennedy for a "swollen" gland.. 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 He is seen [facility] for cardiac rehabilitation-type issues. He has had his Lipitor stopped due to muscle pains but tolerating pravastatin He quit smoking in May. He has been on Chantix in the past because nightmares.He needs a followup carotid duplex her right bruit.  Echo 6/13     Study Conclusions  - Left ventricle: The cavity size was mildly dilated. Wall thickness was increased in a pattern of mild LVH. Systolic function was normal. The estimated ejection fraction was in the range of 55% to 60%. Wall motion was normal; there were no regional wall motion abnormalities. Features are consistent with a pseudonormal left ventricular filling pattern, with concomitant abnormal relaxation and increased filling pressure (grade 2 diastolic dysfunction). Doppler parameters are consistent with both elevated ventricular end-diastolic filling pressure and elevated left atrial filling pressure. - Left atrium: The atrium was moderately dilated. - Atrial septum: No defect or patent foramen ovale was identified. - Pulmonary arteries: PA peak pressure: 34mm Hg (S).  In June of 2013 he had a successful flutter ablation with Dr Johney Frame.  Has some SSS and AV block Dr Jenel Lucks notes indicate avoiding beta blockers  Has had more fatigue and noted low HR;s since Monday.  In office today in CHB with rate 35  Discussed with patient and Dr Johney Frame will admit He is on low dose metoprolol with dose cut back and calcium  blocker Will stop both and put on telemetry.  ? Pacer in 48 hours if AV block persists   ROS: Denies fever, malais, weight loss, blurry vision, decreased visual acuity, cough, sputum, SOB, hemoptysis, pleuritic pain, palpitaitons, heartburn, abdominal pain, melena, lower extremity edema, claudication, or rash.  All other systems reviewed and negative  General: Affect appropriate Healthy:  appears stated age HEENT: normal Neck supple with no adenopathy JVP normal no bruits no thyromegaly Lungs clear with no wheezing and good diaphragmatic motion Heart:  S1/S2 no murmur, no rub, gallop or click PMI normal Abdomen: benighn, BS positve, no tenderness, no AAA no bruit.  No HSM or HJR Distal pulses intact with no bruits No edema Neuro non-focal Skin warm and dry No muscular weakness   Current Outpatient Prescriptions  Medication Sig Dispense Refill  . ALPRAZolam (XANAX) 1 MG tablet Take 1 mg by mouth at bedtime as needed. For sleep      . amLODipine (NORVASC) 5 MG tablet Take 5 mg by mouth daily.      Marland Kitchen aspirin 81 MG tablet Take 81 mg by mouth daily.      Marland Kitchen aspirin EC 81 MG tablet Take 1 tablet (81 mg total) by mouth daily.  90 tablet  3  . citalopram (CELEXA) 20 MG tablet Take 20 mg by mouth daily.      . ergocalciferol (VITAMIN D2) 50000 UNITS capsule Take 50,000 Units by mouth once a week.      Marland Kitchen HYDROcodone-acetaminophen (NORCO/VICODIN) 5-325 MG per tablet       .  insulin detemir (LEVEMIR FLEXPEN) 100 UNIT/ML injection Inject 40 Units into the skin 2 (two) times daily.      . iron polysaccharides (NIFEREX) 150 MG capsule Take 150 mg by mouth daily.      Marland Kitchen lisinopril (PRINIVIL,ZESTRIL) 40 MG tablet Take 40 mg by mouth daily.      . metoprolol tartrate (LOPRESSOR) 25 MG tablet Take 12.5 mg by mouth daily.      . pravastatin (PRAVACHOL) 40 MG tablet Take 40 mg by mouth daily.      . Tamsulosin HCl (FLOMAX) 0.4 MG CAPS Take 0.4 mg by mouth Daily.       Marland Kitchen linagliptin (TRADJENTA) 5 MG  TABS tablet Take 5 mg by mouth daily.       No current facility-administered medications for this visit.   Facility-Administered Medications Ordered in Other Visits  Medication Dose Route Frequency Provider Last Rate Last Dose  . 0.9 %  sodium chloride infusion  250 mL Intravenous PRN Hillis Range, MD      . benzocaine (HURRICAINE) 20 % mouth spray 1 application  1 application Mouth/Throat PRN Hillis Range, MD      . fentaNYL (SUBLIMAZE) injection 250 mcg  250 mcg Intravenous Once Hillis Range, MD      . midazolam (VERSED) injection 10 mg  10 mg Intravenous Once Hillis Range, MD        Allergies  Penicillins and Sulfonamide derivatives  Electrocardiogram:  CHF rate 35 narrow complex escape  Assessment and Plan

## 2012-04-25 NOTE — Assessment & Plan Note (Signed)
S/P ablation in NSR  Off coumadin

## 2012-04-25 NOTE — Assessment & Plan Note (Signed)
Stable with no angina and good activity level.  Continue medical Rx Dont think he needs cath before pacer as he has no chest pain and QRS complexes are normal

## 2012-04-25 NOTE — Assessment & Plan Note (Signed)
Discussed low carb diet.  Target hemoglobin A1c is 6.5 or less.  Continue current medications.  

## 2012-04-26 ENCOUNTER — Encounter (HOSPITAL_COMMUNITY): Payer: Self-pay | Admitting: Nurse Practitioner

## 2012-04-26 DIAGNOSIS — I441 Atrioventricular block, second degree: Principal | ICD-10-CM

## 2012-04-26 DIAGNOSIS — I10 Essential (primary) hypertension: Secondary | ICD-10-CM

## 2012-04-26 DIAGNOSIS — R001 Bradycardia, unspecified: Secondary | ICD-10-CM

## 2012-04-26 DIAGNOSIS — I5032 Chronic diastolic (congestive) heart failure: Secondary | ICD-10-CM

## 2012-04-26 LAB — GLUCOSE, CAPILLARY: Glucose-Capillary: 98 mg/dL (ref 70–99)

## 2012-04-26 MED ORDER — HEPARIN SOD (PORK) LOCK FLUSH 100 UNIT/ML IV SOLN
500.0000 [IU] | INTRAVENOUS | Status: AC | PRN
  Administered 2012-04-26: 500 [IU]

## 2012-04-26 NOTE — Discharge Summary (Signed)
Patient ID: Frederick Foster,  MRN: [id number], DOB/AGE: 03/19/1951 61 y.o.  Admit date: 04/25/2012 Discharge date: 04/26/2012  Primary Care Provider: Rudi Heap Primary Cardiologist: P. Eden Emms, MD/J. Jae Skeet, MD  Discharge Diagnoses Principal Problem:  *Second degree Mobitz I AV block  **Beta-blocker discontinued this admission. Active Problems:  Bradycardia  Mixed hyperlipidemia  HTN (hypertension)  DM (diabetes mellitus), type 2, uncontrolled  Chronic diastolic CHF (congestive heart failure)  CORONARY ARTERY BYPASS GRAFT, TWO VESSEL, HX OF  CKD (chronic kidney disease), stage III  Allergies Allergies  Allergen Reactions  . Penicillins Swelling  . Sulfonamide Derivatives Rash   Procedures  None  History of Present Illness  61 y/o male with the above problem list. He was seen in clinic by Dr. Eden Emms on 10/15 with complaints of fatigue in the setting of bradycardia with rates in the mid-30's.  There was concern for complete heart block and patient was admitted from the office to [facility] for further evaluation and EP consultation.  Hospital Course  Pts home dose of metoprolol therapy was discontinued.  He has had no chest pain, sob, presyncope, syncope, or significant pauses on telemetry.  He was seen by electrophysiology this AM and telemetry was reviewed revealing sinus bradycardia with rates persistently in the 30's-40's with Mobitz I AV block.  No evidence of Mobitz II or complete heart block was found.  Patient was asymptomatic despite bradycardia and with ambulation, his HR quickly increased into the 80's with 1:1 AV conduction.  Given these findings, in combination with prior EP study in June of this year at which time bradycardia was evaluated thoroughly without evidence of infrahisian block, paucity of symptoms, and patients wish to avoid permanent pacemaker placement, decision was made to follow patient closely off of beta blocker therapy.  We have arranged for an  outpatient exercise treadmill test on Friday 10/18 to further assess for chronotropic competence/incompetence.  He will be discharged home today in good condition.  He has been counseled on the importance of notifying our office if he develops presyncope/syncope/worsening fatigue, etc.  Discharge Vitals Blood pressure 151/66, pulse 60, temperature 98.1 F (36.7 C), temperature source Oral, resp. rate 18, weight 258 lb 12.8 oz (117.391 kg), SpO2 94.00%.  Filed Weights   04/25/12 1018 04/26/12 0500  Weight: 257 lb 8 oz (116.801 kg) 258 lb 12.8 oz (117.391 kg)   Labs  CBC  Basename 04/25/12 1045  WBC 6.7  NEUTROABS 4.5  HGB 11.1*  HCT 31.5*  MCV 83.6  PLT 153   Basic Metabolic Panel  Basename 04/25/12 1045  NA 130*  K 4.3  CL 98  CO2 25  GLUCOSE 285*  BUN 23  CREATININE 1.51*  CALCIUM 9.3  MG 1.9  PHOS --   Liver Function Tests  Basename 04/25/12 1045  AST 12  ALT 9  ALKPHOS 75  BILITOT 0.6  PROT 6.7  ALBUMIN 3.3*   Cardiac Enzymes  Basename 04/25/12 2203 04/25/12 1759 04/25/12 1045  CKTOTAL -- -- --  CKMB -- -- --  CKMBINDEX -- -- --  TROPONINI <0.30 <0.30 <0.30   Thyroid Function Tests  Basename 04/25/12 1045  TSH 4.469  T4TOTAL --  T3FREE --  THYROIDAB --   Disposition  Pt is being discharged home today in good condition.  Follow-up Plans & Appointments      Follow-up Information    Follow up with Norma Fredrickson, NP. On 04/28/2012. (3:30 - Exercise Treadmill Test with Dr. Jenel Lucks Nurse Practitioner.  Wear comfortable  shoes and clothing.)    Contact information:   [address] [place] [address] [phone]       Follow up with Hillis Range, MD. On 06/02/2012. (10:30 AM)    Contact information:   [address] [place] [address] [phone]        Discharge Medications    Medication List     As of 04/26/2012 12:42 PM    STOP taking these medications         metoprolol tartrate 25 MG tablet    Commonly known as: LOPRESSOR      TAKE these medications         ALPRAZolam 1 MG tablet   Commonly known as: XANAX   Take 1 mg by mouth at bedtime as needed. For sleep      ALPRAZolam 0.25 MG tablet   Commonly known as: XANAX   Take 0.25 mg by mouth daily as needed. For anxiety      amLODipine 5 MG tablet   Commonly known as: NORVASC   Take 5 mg by mouth daily.      aspirin EC 81 MG tablet   Take 81 mg by mouth daily.      citalopram 20 MG tablet   Commonly known as: CELEXA   Take 20 mg by mouth daily.      ergocalciferol 50000 UNITS capsule   Commonly known as: VITAMIN D2   Take 50,000 Units by mouth once a week.      ferrous sulfate 325 (65 FE) MG tablet   Take 650 mg by mouth daily with breakfast.      HYDROcodone-acetaminophen 5-325 MG per tablet   Commonly known as: NORCO/VICODIN   Take 1 tablet by mouth every 6 (six) hours as needed. For pain      LEVEMIR FLEXPEN 100 UNIT/ML injection   Generic drug: insulin detemir   Inject 40 Units into the skin 2 (two) times daily.      linagliptin 5 MG Tabs tablet   Commonly known as: TRADJENTA   Take 5 mg by mouth daily.      lisinopril 40 MG tablet   Commonly known as: PRINIVIL,ZESTRIL   Take 40 mg by mouth daily.      rosuvastatin 5 MG tablet   Commonly known as: CRESTOR   Take 5 mg by mouth at bedtime.      Tamsulosin HCl 0.4 MG Caps   Commonly known as: FLOMAX   Take 0.4 mg by mouth at bedtime.       Outstanding Labs/Studies  Exercise Treadmill Test to assess for chronotropic competence off of beta blocker therapy on Friday 10/18.  Duration of Discharge Encounter   Greater than 30 minutes including physician time.  Signed, Nicolasa Ducking NP 04/26/2012, 12:42 PM   Hillis Range, MD

## 2012-04-26 NOTE — Progress Notes (Signed)
Pt resting in bed. Pt's BP elevated (sbp in the 180's). Pt asymptomatic. Md on call made aware. No new orders received at this time. Will cont to monitor pt.

## 2012-04-26 NOTE — Progress Notes (Signed)
ELECTROPHYSIOLOGY ROUNDING NOTE    Patient Name: Frederick Foster Date of Encounter: 04-26-2012    SUBJECTIVE: Patient admitted 04-25-2012 with 2:1 heart block.  Lopressor was held and he has been monitored on telemetry.  Lab work is notable for negative Troponin, Sodium low at 130.  TSH, K+, Mg normal. Patient feels well.  No chest pain or shortness of breath.  Ambulating without difficulty with 1:1 AV conduction.  No dizziness or pre-syncope.  Pt denies exercise intolerance, states he is not limited with activities at home.  TELEMETRY: Reviewed telemetry pt in sinus rhythm with intermittent Mobitz I AV block, no mobitz II or complete AV block observed, no consecutive P waves not conducted Filed Vitals:   04/25/12 1018 04/25/12 1416 04/25/12 2100 04/26/12 0500  BP: 178/64 183/58 188/74 151/66  Pulse: 35 38 43 60  Temp: 97.9 F (36.6 C) 98 F (36.7 C) 97.5 F (36.4 C) 98.1 F (36.7 C)  TempSrc: Oral Oral Oral Oral  Resp: 20 20 18 18   Weight: 257 lb 8 oz (116.801 kg)   258 lb 12.8 oz (117.391 kg)  SpO2: 96%  95% 94%   No intake or output data in the 24 hours ending 04/26/12 0654  Physical Exam:  GEN- The patient is well appearing, alert and oriented x 3 today.   Head- normocephalic, atraumatic Eyes-  Sclera clear, conjunctiva pink Ears- hearing intact Oropharynx- clear Neck- supple, no JVP Lymph- no cervical lymphadenopathy Lungs- Clear to ausculation bilaterally, normal work of breathing Heart- Regular rate and rhythm, no murmurs, rubs or gallops, PMI not laterally displaced GI- soft, NT, ND, + BS Extremities- no clubbing, cyanosis, or edema MS- no significant deformity or atrophy Skin- no rash or lesion Psych- euthymic mood, full affect Neuro- strength and sensation are intact  LABS: Basic Metabolic Panel:  Basename 04/25/12 1045  NA 130*  K 4.3  CL 98  CO2 25  GLUCOSE 285*  BUN 23  CREATININE 1.51*  CALCIUM 9.3  MG 1.9  PHOS --   Liver Function  Tests:  Basename 04/25/12 1045  AST 12  ALT 9  ALKPHOS 75  BILITOT 0.6  PROT 6.7  ALBUMIN 3.3*   CBC:  Basename 04/25/12 1045  WBC 6.7  NEUTROABS 4.5  HGB 11.1*  HCT 31.5*  MCV 83.6  PLT 153   Cardiac Enzymes:  Basename 04/25/12 2203 04/25/12 1759 04/25/12 1045  CKTOTAL -- -- --  CKMB -- -- --  CKMBINDEX -- -- --  TROPONINI <0.30 <0.30 <0.30   Thyroid Function Tests:  Basename 04/25/12 1045  TSH 4.469  T4TOTAL --  T3FREE --  THYROIDAB --    Radiology/Studies:  Dg Chest 2 View 04/25/2012  *RADIOLOGY REPORT*  Clinical Data: Preoperative evaluation in.  CHEST - 2 VIEW  Comparison: Chest x-ray 11/12/2011.  Findings: Chronic pleuroparenchymal scarring at the left base versus a small left pleural effusion.  No definite acute consolidative airspace disease. Mild pulmonary venous engorgement, without frank pulmonary edema.  Heart size is within normal limits. Mediastinal contours are unremarkable.  Atherosclerosis in the thoracic aorta.  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  Right internal jugular single lumen power Port-A-Cath with tip terminating in the distal superior vena cava.  IMPRESSION: 1.  Blunting of the left costophrenic sulcus may be indicative of chronic pleuroparenchymal scarring or a small left pleural effusion. 2.  Postoperative changes and support apparatus, as above. 3.  Atherosclerosis.   Original Report Authenticated By:  Florencia Reasons, M.D.     Assessment and Plan:  1. Mobitz I second degree AV block with bradycardia He has known bradycardia.  At time of his EP study 6/13 this was evaluated.  He had AV wenckebach when pacing at a cycle length of 900 msec.  This was evaluated thoroughly and he had no infrahisian block at that time.  He says that he is asymptomatic with AV block at this time despite heart rates 30s-40s.  I walked him in the hall and his heart rate quickly increased to 80s with 1:1 AV  conduction.  Given his desire to avoid pacemaker implantation and paucity of symptoms as well as results of recent EP study, I think that we should continue watchful waiting at this time. I will stop metoprolol but resume norvasc. He will return to our office for a GXT on Friday once his metoprolol has fully washed out. I would like to see him back in the office in 4 weeks  2. HTN- above goal off of norvasc He reports previously good BP control at home I will resume norvasc and monitor. He should keep a log of BP readings and bring them when he returns to see me.  DC to home today. He should notify my office immediately should he have dizziness, presyncope, syncope, or any symptoms of bradycardia.

## 2012-04-27 NOTE — H&P (Signed)
DURANTE SAVARD   04/25/2012 8:30 AM Office Visit  MRN: [id number]   Description: 61 year old male  Provider: Charlton Haws, MD  Department: [address]        Referring Provider     Ernestina Penna, MD      Diagnoses     Acute on chronic diastolic heart failure   - Primary    428.33    Atrial flutter     427.32    Postsurgical aortocoronary bypass status     V45.81    Mixed hyperlipidemia     272.2    CHB (complete heart block)     426.0    HTN (hypertension)     401.9    Type II or unspecified type diabetes mellitus without mention of complication, not stated as uncontrolled     250.00    Other symptoms involving cardiovascular system     785.9    CAD (coronary artery disease)     414.00      Reason for Visit     Appointment         Vitals - Last Recorded       BP Pulse Ht Wt BMI      137/82 35 6' (1.829 m) 257 lb (116.574 kg) 34.86 kg/m2         Vitals History Recorded      Progress Notes     Charlton Haws, MD  04/25/2012  9:48 AM  Signed Patient ID: Frederick Foster, male   DOB: 1951/06/18, 61 y.o.   MRN: [id number] Frederick Foster returns today for followup.he is a previous patient of Dr. Samule Ohm. He had coronary bypass surgery by Dr. Lavinia Sharps in 2009. this was complicated by sternal wound infection. He has had his ventral hernia repair by Dr. Carolynne Edouard last 2011 . He had his left kidney removed last year. He needs neck surgery with Dr Pollyann Kennedy for a "swollen" gland.. 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 He is seen [facility] for cardiac rehabilitation-type issues. He has had his Lipitor stopped due to muscle pains but tolerating pravastatin He quit smoking in May. He has been on Chantix in the past because nightmares.He needs a followup carotid duplex her right bruit.   Echo 6/13       Study Conclusions  - Left ventricle: The cavity size was mildly dilated. Wall thickness was increased in a pattern of  mild LVH. Systolic function was normal. The estimated ejection fraction was in the range of 55% to 60%. Wall motion was normal; there were no regional wall motion abnormalities. Features are consistent with a pseudonormal left ventricular filling pattern, with concomitant abnormal relaxation and increased filling pressure (grade 2 diastolic dysfunction). Doppler parameters are consistent with both elevated ventricular end-diastolic filling pressure and elevated left atrial filling pressure. - Left atrium: The atrium was moderately dilated. - Atrial septum: No defect or patent foramen ovale was identified. - Pulmonary arteries: PA peak pressure: 34mm Hg (S).   In June of 2013 he had a successful flutter ablation with Dr Johney Frame.  Has some SSS and AV block Dr Jenel Lucks notes indicate avoiding beta blockers  Has had more fatigue and noted low HR;s since Monday.  In office today in CHB with rate 35  Discussed with patient and Dr Johney Frame will admit He is on low dose metoprolol with dose cut back and calcium blocker Will stop both and  put on telemetry.  ? Pacer in 48 hours if AV block persists     ROS: Denies fever, malais, weight loss, blurry vision, decreased visual acuity, cough, sputum, SOB, hemoptysis, pleuritic pain, palpitaitons, heartburn, abdominal pain, melena, lower extremity edema, claudication, or rash.  All other systems reviewed and negative   General: Affect appropriate Healthy:  appears stated age HEENT: normal Neck supple with no adenopathy JVP normal no bruits no thyromegaly Lungs clear with no wheezing and good diaphragmatic motion Heart:  S1/S2 no murmur, no rub, gallop or click PMI normal Abdomen: benighn, BS positve, no tenderness, no AAA no bruit.  No HSM or HJR Distal pulses intact with no bruits No edema Neuro non-focal Skin warm and dry No muscular weakness      Current Outpatient Prescriptions   Medication  Sig  Dispense  Refill   .  ALPRAZolam (XANAX)  1 MG tablet  Take 1 mg by mouth at bedtime as needed. For sleep         .  amLODipine (NORVASC) 5 MG tablet  Take 5 mg by mouth daily.         Marland Kitchen  aspirin 81 MG tablet  Take 81 mg by mouth daily.         Marland Kitchen  aspirin EC 81 MG tablet  Take 1 tablet (81 mg total) by mouth daily.   90 tablet   3   .  citalopram (CELEXA) 20 MG tablet  Take 20 mg by mouth daily.         .  ergocalciferol (VITAMIN D2) 50000 UNITS capsule  Take 50,000 Units by mouth once a week.         Marland Kitchen  HYDROcodone-acetaminophen (NORCO/VICODIN) 5-325 MG per tablet           .  insulin detemir (LEVEMIR FLEXPEN) 100 UNIT/ML injection  Inject 40 Units into the skin 2 (two) times daily.         .  iron polysaccharides (NIFEREX) 150 MG capsule  Take 150 mg by mouth daily.         Marland Kitchen  lisinopril (PRINIVIL,ZESTRIL) 40 MG tablet  Take 40 mg by mouth daily.         .  metoprolol tartrate (LOPRESSOR) 25 MG tablet  Take 12.5 mg by mouth daily.         .  pravastatin (PRAVACHOL) 40 MG tablet  Take 40 mg by mouth daily.         .  Tamsulosin HCl (FLOMAX) 0.4 MG CAPS  Take 0.4 mg by mouth Daily.          Marland Kitchen  linagliptin (TRADJENTA) 5 MG TABS tablet  Take 5 mg by mouth daily.             No current facility-administered medications for this visit.       Facility-Administered Medications Ordered in Other Visits   Medication  Dose  Route  Frequency  Provider  Last Rate  Last Dose   .  0.9 %  sodium chloride infusion   250 mL  Intravenous  PRN  Hillis Range, MD         .  benzocaine (HURRICAINE) 20 % mouth spray 1 application   1 application  Mouth/Throat  PRN  Hillis Range, MD         .  fentaNYL (SUBLIMAZE) injection 250 mcg   250 mcg  Intravenous  Once  Hillis Range, MD         .  midazolam (VERSED) injection 10 mg   10 mg  Intravenous  Once  Hillis Range, MD            Allergies   Penicillins and Sulfonamide derivatives   Electrocardiogram:  CHF rate 35 narrow complex escape   Assessment and Plan        Acute on chronic diastolic  heart failure - Charlton Haws, MD  04/25/2012  9:29 AM  Signed Euvolemic mild dyspna likely related to CHB  Check echo  Atrial flutter - Charlton Haws, MD  04/25/2012  9:30 AM  Signed S/P ablation in NSR  Off coumadin  CORONARY ARTERY BYPASS GRAFT, TWO VESSEL, HX OF - Charlton Haws, MD  04/25/2012  9:30 AM  Signed Stable with no angina and good activity level.  Continue medical Rx Dont think he needs cath before pacer as he has no chest pain and QRS complexes are normal  MIXED HYPERLIPIDEMIA - Charlton Haws, MD  04/25/2012  9:31 AM  Signed Cholesterol is at goal.  Continue current dose of statin and diet Rx.  No myalgias or side effects.  F/U  LFT's in 6 months. No results found for this basename: LDLCALC                           CHB (complete heart block) - Charlton Haws, MD  04/25/2012  9:32 AM  Signed Admit.  Patient insists on driving over and will not take ambulance and no family to drive.  Risks and legal issues discussed and he is willing to take responsibility.  Stop norvasc and lopresser Telemetry  Dr Johney Frame to see in hospital ? Pace in 48 hours if high grade AV block persists Check lytes and echo  HTN (hypertension) - Charlton Haws, MD  04/25/2012  9:33 AM  Signed Well controlled.  Continue current medications and low sodium Dash type diet.       AODM - Charlton Haws, MD  04/25/2012  9:33 AM  Signed Discussed low carb diet.  Target hemoglobin A1c is 6.5 or less.  Continue current medications.     CAROTID BRUIT - Charlton Haws, MD  04/25/2012  9:34 AM  Signed Not heard today needs F/U duplex 2011 0-39% disease     Electronic signature on 04/25/2012           Orders Placed This Encounter     EKG 12-Lead [EKG1 Custom]       Results are available for this encounter         Discontinued Medications         Reason for Discontinue    acetaminophen (TYLENOL) 325 MG tablet Error       Level of Service     PR OFFICE OUTPATIENT VISIT 40 MINUTES [99215]           All Charges for This Encounter       Code Description Service Date Service Provider Modifiers Quantity    93000 PR ELECTROCARDIOGRAM, COMPLETE 04/25/2012 Wendall Stade, MD   1    [phone] PR OFFICE OUTPATIENT VISIT 40 MINUTES 04/25/2012 Wendall Stade, MD   1           Previous Visit         Provider Department Encounter #    04/10/2012  9:00 AM Charlton Haws, MD [facility] [id number]

## 2012-04-28 ENCOUNTER — Ambulatory Visit (INDEPENDENT_AMBULATORY_CARE_PROVIDER_SITE_OTHER): Payer: Medicare Other | Admitting: Nurse Practitioner

## 2012-04-28 ENCOUNTER — Encounter: Payer: Self-pay | Admitting: Nurse Practitioner

## 2012-04-28 VITALS — BP 136/70 | HR 79

## 2012-04-28 DIAGNOSIS — I498 Other specified cardiac arrhythmias: Secondary | ICD-10-CM

## 2012-04-28 DIAGNOSIS — R001 Bradycardia, unspecified: Secondary | ICD-10-CM

## 2012-04-28 MED ORDER — AMLODIPINE BESYLATE 5 MG PO TABS: 5.0000 mg | ORAL_TABLET | Freq: Two times a day (BID) | ORAL | Status: DC

## 2012-04-28 NOTE — Procedures (Signed)
Exercise Treadmill Test  Pre-Exercise Testing Evaluation Rhythm: normal sinus  Rate: 79   PR:  .20 QRS:  .09  QT:  .38 QTc: .43     Test  Exercise Tolerance Test Ordering MD: Hillis Range, MD  Interpreting MD: Norma Fredrickson , NP  Unique Test No: 1  Treadmill:  1  Indication for ETT: Bradycardia  Contraindication to ETT: No   Stress Modality: exercise - treadmill  Cardiac Imaging Performed: non   Protocol: standard Bruce - maximal  Max BP:  171/53  Max MPHR (bpm):  158 85% MPR (bpm):  134  MPHR obtained (bpm):  141 % MPHR obtained:  89%  Reached 85% MPHR (min:sec):  2;22 Total Exercise Time (min-sec): 3;00  Workload in METS:  4.6 Borg Scale: 17  Reason ETT Terminated:  patient's desire to stop    ST Segment Analysis At Rest: normal ST segments - no evidence of significant ST depression With Exercise: no evidence of significant ST depression  Other Information Arrhythmia:  No Angina during ETT:  absent (0) Quality of ETT:  diagnostic  ETT Interpretation:  normal - no evidence of ischemia by ST analysis  Comments: Patient presents today for routine GXT to determine chronotropic competence. He has had second degree type I heart block. Beta blocker was stopped due to rates in the 30 to 40's.   Today he exercised on the standard Bruce protocol. He exercised for a total of 3 minutes. His heart rate was 79 at rest. He met his target at 2:22. Max heart rate was 141. One couplet noted. Blood pressure rose to 205/50. The test was stopped due to fatigue and knee pain. No angina. He was initially in sinus rhythm, borderline 1st degree AV block. During recovery, he had 2nd degree Mobitz Type I. Lowest rate was 59. The patient was asymptomatic.   Recommendations: Have reviewed the ECG tracings with Dr. Johney Frame. Patient remains asymptomatic. Blood pressure is elevated. Have increased his Norvasc to 5mg  BID. No indication for pacemaker at this time. Patient will follow up in one month as planned  with Dr. Johney Frame. He is to call for any dizzy/lightheaded/syncopal spells.   Patient is agreeable to this plan and will call if any problems develop in the interim.

## 2012-04-28 NOTE — Patient Instructions (Addendum)
Increase your amlodipine to 5 mg two times a day for your blood pressure.  See Dr. Johney Frame next month  Call the [facility] office at [phone] if you have any questions, problems or concerns.

## 2012-05-24 ENCOUNTER — Encounter: Payer: Medicare Other | Admitting: Internal Medicine

## 2012-05-24 ENCOUNTER — Other Ambulatory Visit (HOSPITAL_COMMUNITY): Payer: Self-pay | Admitting: Internal Medicine

## 2012-05-24 DIAGNOSIS — D649 Anemia, unspecified: Secondary | ICD-10-CM

## 2012-05-24 DIAGNOSIS — N189 Chronic kidney disease, unspecified: Secondary | ICD-10-CM

## 2012-05-24 DIAGNOSIS — C8590 Non-Hodgkin lymphoma, unspecified, unspecified site: Secondary | ICD-10-CM

## 2012-05-24 DIAGNOSIS — C8589 Other specified types of non-Hodgkin lymphoma, extranodal and solid organ sites: Secondary | ICD-10-CM

## 2012-05-30 ENCOUNTER — Telehealth: Payer: Self-pay | Admitting: Internal Medicine

## 2012-05-30 NOTE — Telephone Encounter (Signed)
plz return call to pt [phone] regarding upcoming stress test, pt complains he just has a stress test on 04/28/12 about a month ago.

## 2012-05-30 NOTE — Telephone Encounter (Signed)
Spoke with patient will cx test and he wants to move his ov to the first of the year

## 2012-06-02 ENCOUNTER — Encounter: Payer: Medicare Other | Admitting: Internal Medicine

## 2012-06-28 DIAGNOSIS — I498 Other specified cardiac arrhythmias: Secondary | ICD-10-CM

## 2012-06-29 DIAGNOSIS — I498 Other specified cardiac arrhythmias: Secondary | ICD-10-CM

## 2012-07-19 DIAGNOSIS — C8589 Other specified types of non-Hodgkin lymphoma, extranodal and solid organ sites: Secondary | ICD-10-CM

## 2012-07-19 DIAGNOSIS — Z452 Encounter for adjustment and management of vascular access device: Secondary | ICD-10-CM

## 2012-07-26 ENCOUNTER — Ambulatory Visit: Payer: Medicare Other | Admitting: Internal Medicine

## 2012-08-08 ENCOUNTER — Encounter (HOSPITAL_COMMUNITY)
Admission: RE | Admit: 2012-08-08 | Discharge: 2012-08-08 | Disposition: A | Discharge status: Home / Self Care | Payer: Medicare Other | Source: Ambulatory Visit | Attending: Internal Medicine | Admitting: Internal Medicine

## 2012-08-08 DIAGNOSIS — C8590 Non-Hodgkin lymphoma, unspecified, unspecified site: Secondary | ICD-10-CM

## 2012-08-08 DIAGNOSIS — C8589 Other specified types of non-Hodgkin lymphoma, extranodal and solid organ sites: Secondary | ICD-10-CM | POA: Insufficient documentation

## 2012-08-08 LAB — GLUCOSE, CAPILLARY: Glucose-Capillary: 161 mg/dL — ABNORMAL HIGH (ref 70–99)

## 2012-08-08 MED ORDER — FLUDEOXYGLUCOSE F - 18 (FDG) INJECTION
17.6000 | Freq: Once | INTRAVENOUS | Status: AC | PRN
  Administered 2012-08-08: 17.6 via INTRAVENOUS

## 2012-08-14 ENCOUNTER — Encounter: Payer: Medicare Other | Admitting: Internal Medicine

## 2012-08-14 DIAGNOSIS — N390 Urinary tract infection, site not specified: Secondary | ICD-10-CM

## 2012-08-14 DIAGNOSIS — N400 Enlarged prostate without lower urinary tract symptoms: Secondary | ICD-10-CM

## 2012-08-14 DIAGNOSIS — N189 Chronic kidney disease, unspecified: Secondary | ICD-10-CM

## 2012-08-14 DIAGNOSIS — C8589 Other specified types of non-Hodgkin lymphoma, extranodal and solid organ sites: Secondary | ICD-10-CM

## 2012-08-24 ENCOUNTER — Ambulatory Visit: Payer: Medicare Other | Admitting: Internal Medicine

## 2012-09-05 ENCOUNTER — Telehealth: Payer: Self-pay | Admitting: Cardiovascular Disease

## 2012-09-05 NOTE — Telephone Encounter (Signed)
New Prob   Requesting ejection fractor from last Echo. She said its ok to leave on her VM.

## 2012-09-05 NOTE — Telephone Encounter (Signed)
LMTCB ./CY 

## 2012-09-06 NOTE — Telephone Encounter (Signed)
LEFT VOICE MAIL THAT EF WAS 55-60 % ON LAST ECHO ./CY

## 2012-09-06 NOTE — Telephone Encounter (Signed)
Follow-up:    Returned your call requesting LV EF from his last ECHO.  Please call back and feel free to leave the info on her VM and reference the patient by name.

## 2012-09-19 DIAGNOSIS — C8589 Other specified types of non-Hodgkin lymphoma, extranodal and solid organ sites: Secondary | ICD-10-CM

## 2012-09-19 DIAGNOSIS — Z452 Encounter for adjustment and management of vascular access device: Secondary | ICD-10-CM

## 2012-09-20 ENCOUNTER — Telehealth: Payer: Self-pay | Admitting: Cardiovascular Disease

## 2012-09-20 NOTE — Telephone Encounter (Signed)
Left Fleet Contras from [facility] regarding pt's last Ejection fraction. Pt has the last 2-D echo on 01/08/12 his ejection fraction then was 55 to 60 %. Fleet Contras is to call back if any questions.

## 2012-09-20 NOTE — Telephone Encounter (Signed)
New problem    Recently EJ  fraction from most recent echo . Can leave message on voice mail.

## 2012-10-23 ENCOUNTER — Encounter: Payer: Self-pay | Admitting: Nurse Practitioner

## 2012-10-23 ENCOUNTER — Ambulatory Visit (INDEPENDENT_AMBULATORY_CARE_PROVIDER_SITE_OTHER): Payer: Medicare Other | Admitting: Nurse Practitioner

## 2012-10-23 VITALS — BP 147/64 | HR 61 | Temp 97.6°F | Ht 72.0 in | Wt 256.0 lb

## 2012-10-23 DIAGNOSIS — F418 Other specified anxiety disorders: Secondary | ICD-10-CM | POA: Insufficient documentation

## 2012-10-23 DIAGNOSIS — F341 Dysthymic disorder: Secondary | ICD-10-CM

## 2012-10-23 DIAGNOSIS — E119 Type 2 diabetes mellitus without complications: Secondary | ICD-10-CM

## 2012-10-23 LAB — BASIC METABOLIC PANEL WITH GFR
BUN: 42 mg/dL — ABNORMAL HIGH (ref 6–23)
Chloride: 100 mEq/L (ref 96–112)
GFR, Est African American: 31 mL/min — ABNORMAL LOW
GFR, Est Non African American: 27 mL/min — ABNORMAL LOW
Potassium: 4.4 mEq/L (ref 3.5–5.3)
Sodium: 133 mEq/L — ABNORMAL LOW (ref 135–145)

## 2012-10-23 LAB — GLUCOSE, POCT (MANUAL RESULT ENTRY): POC Glucose: 153 mg/dl — AB (ref 70–99)

## 2012-10-23 LAB — HEPATIC FUNCTION PANEL
Alkaline Phosphatase: 60 U/L (ref 39–117)
Bilirubin, Direct: 0.1 mg/dL (ref 0.0–0.3)
Indirect Bilirubin: 0.4 mg/dL (ref 0.0–0.9)
Total Bilirubin: 0.5 mg/dL (ref 0.3–1.2)
Total Protein: 6.3 g/dL (ref 6.0–8.3)

## 2012-10-23 LAB — POCT GLYCOSYLATED HEMOGLOBIN (HGB A1C): Hemoglobin A1C: 10.3

## 2012-10-23 MED ORDER — LINAGLIPTIN 5 MG PO TABS: 5.0000 mg | ORAL_TABLET | Freq: Every day | ORAL | Status: DC

## 2012-10-23 NOTE — Patient Instructions (Addendum)
Health Maintenance, Males A healthy lifestyle and preventative care can promote health and wellness.  Maintain regular health, dental, and eye exams.  Eat a healthy diet. Foods like vegetables, fruits, whole grains, low-fat dairy products, and lean protein foods contain the nutrients you need without too many calories. Decrease your intake of foods high in solid fats, added sugars, and salt. Get information about a proper diet from your caregiver, if necessary.  Regular physical exercise is one of the most important things you can do for your health. Most adults should get at least 150 minutes of moderate-intensity exercise (any activity that increases your heart rate and causes you to sweat) each week. In addition, most adults need muscle-strengthening exercises on 2 or more days a week.   Maintain a healthy weight. The body mass index (BMI) is a screening tool to identify possible weight problems. It provides an estimate of body fat based on height and weight. Your caregiver can help determine your BMI, and can help you achieve or maintain a healthy weight. For adults 20 years and older:  A BMI below 18.5 is considered underweight.  A BMI of 18.5 to 24.9 is normal.  A BMI of 25 to 29.9 is considered overweight.  A BMI of 30 and above is considered obese.  Maintain normal blood lipids and cholesterol by exercising and minimizing your intake of saturated fat. Eat a balanced diet with plenty of fruits and vegetables. Blood tests for lipids and cholesterol should begin at age 20 and be repeated every 5 years. If your lipid or cholesterol levels are high, you are over 50, or you are a high risk for heart disease, you may need your cholesterol levels checked more frequently.Ongoing high lipid and cholesterol levels should be treated with medicines, if diet and exercise are not effective.  If you smoke, find out from your caregiver how to quit. If you do not use tobacco, do not start.  If you  choose to drink alcohol, do not exceed 2 drinks per day. One drink is considered to be 12 ounces (355 mL) of beer, 5 ounces (148 mL) of wine, or 1.5 ounces (44 mL) of liquor.  Avoid use of street drugs. Do not share needles with anyone. Ask for help if you need support or instructions about stopping the use of drugs.  High blood pressure causes heart disease and increases the risk of stroke. Blood pressure should be checked at least every 1 to 2 years. Ongoing high blood pressure should be treated with medicines if weight loss and exercise are not effective.  If you are 45 to 64 years old, ask your caregiver if you should take aspirin to prevent heart disease.  Diabetes screening involves taking a blood sample to check your fasting blood sugar level. This should be done once every 3 years, after age 45, if you are within normal weight and without risk factors for diabetes. Testing should be considered at a younger age or be carried out more frequently if you are overweight and have at least 1 risk factor for diabetes.  Colorectal cancer can be detected and often prevented. Most routine colorectal cancer screening begins at the age of 50 and continues through age 75. However, your caregiver may recommend screening at an earlier age if you have risk factors for colon cancer. On a yearly basis, your caregiver may provide home test kits to check for hidden blood in the stool. Use of a small camera at the end of a tube,   to directly examine the colon (sigmoidoscopy or colonoscopy), can detect the earliest forms of colorectal cancer. Talk to your caregiver about this at age 50, when routine screening begins. Direct examination of the colon should be repeated every 5 to 10 years through age 75, unless early forms of pre-cancerous polyps or small growths are found.  Hepatitis C blood testing is recommended for all people born from 1945 through 1965 and any individual with known risks for hepatitis C.  Healthy  men should no longer receive prostate-specific antigen (PSA) blood tests as part of routine cancer screening. Consult with your caregiver about prostate cancer screening.  Testicular cancer screening is not recommended for adolescents or adult males who have no symptoms. Screening includes self-exam, caregiver exam, and other screening tests. Consult with your caregiver about any symptoms you have or any concerns you have about testicular cancer.  Practice safe sex. Use condoms and avoid high-risk sexual practices to reduce the spread of sexually transmitted infections (STIs).  Use sunscreen with a sun protection factor (SPF) of 30 or greater. Apply sunscreen liberally and repeatedly throughout the day. You should seek shade when your shadow is shorter than you. Protect yourself by wearing long sleeves, pants, a wide-brimmed hat, and sunglasses year round, whenever you are outdoors.  Notify your caregiver of new moles or changes in moles, especially if there is a change in shape or color. Also notify your caregiver if a mole is larger than the size of a pencil eraser.  A one-time screening for abdominal aortic aneurysm (AAA) and surgical repair of large AAAs by sound wave imaging (ultrasonography) is recommended for ages 65 to 75 years who are current or former smokers.  Stay current with your immunizations. Document Released: 12/25/2007 Document Revised: 09/20/2011 Document Reviewed: 11/23/2010 ExitCare Patient Information 2013 [employer].  

## 2012-10-23 NOTE — Progress Notes (Signed)
Subjective:    Patient ID: Frederick Foster, male    DOB: 1951/05/05, 62 y.o.   MRN: [id number]  Hypertension This is a chronic problem. The current episode started more than 1 year ago. The problem is controlled. Associated symptoms include peripheral edema. Pertinent negatives include no chest pain, headaches, palpitations or shortness of breath. Risk factors for coronary artery disease include male gender, dyslipidemia and diabetes mellitus. Past treatments include ACE inhibitors and calcium channel blockers. The current treatment provides moderate improvement. There are no compliance problems.   Hyperlipidemia This is a chronic problem. The current episode started more than 1 year ago. The problem is controlled. Recent lipid tests were reviewed and are normal. Exacerbating diseases include diabetes. Pertinent negatives include no chest pain, myalgias or shortness of breath. Current antihyperlipidemic treatment includes statins. The current treatment provides significant improvement of lipids. Risk factors for coronary artery disease include diabetes mellitus, hypertension and male sex.  Diabetes He presents for his follow-up diabetic visit. He has type 2 diabetes mellitus. His disease course has been improving. There are no hypoglycemic associated symptoms. Pertinent negatives for hypoglycemia include no dizziness or headaches. Pertinent negatives for diabetes include no chest pain, no polydipsia, no polyuria, no visual change, no weakness and no weight loss. There are no hypoglycemic complications. Symptoms are stable. There are no diabetic complications. Risk factors for coronary artery disease include dyslipidemia, male sex and hypertension. Current diabetic treatment includes oral agent (monotherapy) and insulin injections. He is compliant with treatment all of the time. His weight is stable. He is following a generally healthy diet. He never participates in exercise. He monitors blood glucose at  home 3-4 x per week. His overall blood glucose range is 130-140 mg/dl. An ACE inhibitor/angiotensin II receptor blocker is being taken. Eye exam is not current.  GAD/Depression This is a chronic problem. Pt states he has had "alot on his mind lately" due to the return of cancer and extensive surgery scheduled. Has not being taking iron or Vit. D due to this reason as well.  * Recent Dx of Prostate and bladder cancer- See Dr. Derryl Harbor- Has next appointment in 1 week.  Review of Systems  Constitutional: Negative for weight loss.  Respiratory: Negative for shortness of breath.   Cardiovascular: Negative for chest pain and palpitations.  Endocrine: Negative for polydipsia and polyuria.  Musculoskeletal: Negative for myalgias.  Neurological: Negative for dizziness, weakness and headaches.  All other systems reviewed and are negative.       Objective:   Physical Exam  Constitutional: He is oriented to person, place, and time. He appears well-developed and well-nourished.  HENT:  Head: Normocephalic.  Right Ear: External ear normal.  Left Ear: External ear normal.  Nose: Nose normal.  Mouth/Throat: Oropharynx is clear and moist.  Eyes: EOM are normal. Pupils are equal, round, and reactive to light.  Neck: Normal range of motion. Neck supple. No thyromegaly present.  Cardiovascular: Normal rate, regular rhythm, normal heart sounds and intact distal pulses.   No murmur heard. Pulmonary/Chest: Effort normal and breath sounds normal. He has no wheezes. He has no rales.  Abdominal: Soft. Bowel sounds are normal.  Genitourinary: Prostate normal and penis normal.  Musculoskeletal: Normal range of motion.  Neurological: He is alert and oriented to person, place, and time.  Skin: Skin is warm and dry.  No sensation in big toe and 2nd toe bil, +3/4 monofilament bil, callus formation bil heels   Psychiatric: He has a normal mood and  affect. His behavior is normal. Judgment and thought content  normal.    BP 147/64  Pulse 61  Temp(Src) 97.6 F (36.4 C) (Oral)  Ht 6' (1.829 m)  Wt 256 lb (116.121 kg)  BMI 34.71 kg/m2  Results for orders placed in visit on 10/23/12  POCT GLYCOSYLATED HEMOGLOBIN (HGB A1C)      Result Value Range   Hemoglobin A1C 10.3    GLUCOSE, POCT (MANUAL RESULT ENTRY)      Result Value Range   POC Glucose 153 (*) 70 - 99 mg/dl         Assessment & Plan:  Hypertension  Low Na+ diet  CMP, Hepatic function panel pending Peripheral Edema  Continue lasix as Rx Hyperlipidemia  Low fat diet  Exercise Diabetes, Type 2 Uncontrolled  Strict low carb diet  Monitor BS daily  increase levemir to 45u bid  Keep diary of blood sugars  Recheck in 1 month Prostate and Bladder Ca  Keep F/U with Dr.Bower  Mary-Margaret Daphine Deutscher, FNP

## 2012-10-25 ENCOUNTER — Telehealth: Payer: Self-pay | Admitting: Nurse Practitioner

## 2012-10-25 LAB — NMR LIPOPROFILE WITH LIPIDS
HDL Particle Number: 23.6 umol/L — ABNORMAL LOW (ref >=30.5)
HDL-C: 37 mg/dL — ABNORMAL LOW (ref >=40)
LDL (calc): 72 mg/dL (ref <100)
LDL Size: 20.5 nm — ABNORMAL LOW (ref >20.5)
LP-IR Score: 55 — ABNORMAL HIGH (ref <=45)
Large HDL-P: 3.6 umol/L — ABNORMAL LOW (ref >=4.8)
Triglycerides: 97 mg/dL (ref <150)

## 2012-10-26 ENCOUNTER — Telehealth: Payer: Self-pay | Admitting: *Deleted

## 2012-10-26 NOTE — Telephone Encounter (Signed)
OK tradjenta samples

## 2012-10-26 NOTE — Telephone Encounter (Signed)
No samples available. Will check back in one week

## 2012-10-30 ENCOUNTER — Telehealth: Payer: Self-pay | Admitting: Nurse Practitioner

## 2012-10-30 NOTE — Telephone Encounter (Signed)
Patient cant' take anything else due to kidney Function. Make him appoinment with clinical Pharmacist to see what she can do.

## 2012-10-30 NOTE — Telephone Encounter (Signed)
Give samples

## 2012-10-30 NOTE — Telephone Encounter (Signed)
NO SAMPLES AVAILABLE  

## 2012-10-31 NOTE — Telephone Encounter (Signed)
appt made with pharm to discuss meds.

## 2012-11-01 ENCOUNTER — Ambulatory Visit (INDEPENDENT_AMBULATORY_CARE_PROVIDER_SITE_OTHER): Payer: Medicare Other | Admitting: Pharmacist

## 2012-11-01 ENCOUNTER — Telehealth: Payer: Self-pay | Admitting: Pharmacist

## 2012-11-01 VITALS — BP 160/70 | HR 52 | Ht 72.0 in | Wt 252.0 lb

## 2012-11-01 DIAGNOSIS — R7989 Other specified abnormal findings of blood chemistry: Secondary | ICD-10-CM

## 2012-11-01 DIAGNOSIS — D649 Anemia, unspecified: Secondary | ICD-10-CM

## 2012-11-01 DIAGNOSIS — E559 Vitamin D deficiency, unspecified: Secondary | ICD-10-CM

## 2012-11-01 DIAGNOSIS — N189 Chronic kidney disease, unspecified: Secondary | ICD-10-CM

## 2012-11-01 DIAGNOSIS — R799 Abnormal finding of blood chemistry, unspecified: Secondary | ICD-10-CM

## 2012-11-01 DIAGNOSIS — E119 Type 2 diabetes mellitus without complications: Secondary | ICD-10-CM

## 2012-11-01 LAB — POCT CBC
HCT, POC: 37.4 % — AB (ref 43.5–53.7)
Hemoglobin: 13.1 g/dL — AB (ref 14.1–18.1)
MCH, POC: 30.7 pg (ref 27–31.2)
MCHC: 35.1 g/dL (ref 31.8–35.4)
POC LYMPH PERCENT: 31.6 %L (ref 10–50)
RDW, POC: 13.7 %
WBC: 6.5 10*3/uL (ref 4.6–10.2)

## 2012-11-01 LAB — BASIC METABOLIC PANEL WITH GFR
CO2: 24 mEq/L (ref 19–32)
Calcium: 9.3 mg/dL (ref 8.4–10.5)
Creat: 2 mg/dL — ABNORMAL HIGH (ref 0.50–1.35)
GFR, Est African American: 40 mL/min — ABNORMAL LOW
Glucose, Bld: 160 mg/dL — ABNORMAL HIGH (ref 70–99)

## 2012-11-01 MED ORDER — GLIMEPIRIDE 4 MG PO TABS: 4.0000 mg | ORAL_TABLET | Freq: Every day | ORAL | Status: DC

## 2012-11-01 NOTE — Telephone Encounter (Signed)
Patient CBC showed that HGB and HCT improved but still low.  Recommend restart OTC iron 325mg  1poqd  Recheck CBC in 1-2 months

## 2012-11-01 NOTE — Progress Notes (Signed)
Diabetes Follow-Up Visit Chief Complaint:   Chief Complaint  Patient presents with  . Diabetes  . Hypertension     Filed Vitals:   11/01/12 0959  BP: 160/70  Pulse: 52     HPI: Patient has been taking [place] 5mg  daily for at least 1 year.  He called last week to request alternative because copay is $95.  Patient has a h/o elevated scr.  He is also taking levemir 45 units twice daily.    Exam Edema:  negative Polyuria:  neg  Polydipsia:  neg Polyphagia:  neg  BMI:  Body mass index is 34.17 kg/(m^2).   Weight changes:  stable General Appearance:  obese Mood/Affect:  normal   Low fat/carbohydrate diet?  No Nicotine Abuse?  No Medication Compliance?  No - patient unable to afford Tradjenta - has been out 1 week. Exercise?  No Alcohol Abuse?  No  Home BG Monitoring:  Checking 1 times a day. Average:  200's  High: 325  Low:  100   Lab Results  Component Value Date   HGBA1C 10.3 10/23/2012    No results found for this basenameConcepcion Elk    Lab Results  Component Value Date   LDLCALC 72 10/23/2012   TRIG 97 10/23/2012    BMET    Component Value Date/Time   NA 133* 10/23/2012 1005   K 4.4 10/23/2012 1005   CL 100 10/23/2012 1005   CO2 26 10/23/2012 1005   GLUCOSE 156* 10/23/2012 1005   BUN 42* 10/23/2012 1005   CREATININE 2.45* 10/23/2012 1005   CREATININE 1.51* 04/25/2012 1045   CALCIUM 9.2 10/23/2012 1005   GFRNONAA 48* 04/25/2012 1045   GFRAA 55* 04/25/2012 1045    CBC    Component Value Date/Time   WBC 6.5 11/01/2012 1103   WBC 6.7 04/25/2012 1045   RBC 4.3* 11/01/2012 1103   RBC 3.77* 04/25/2012 1045   HGB 13.1* 11/01/2012 1103   HGB 11.1* 04/25/2012 1045   HCT 37.4* 11/01/2012 1103   HCT 31.5* 04/25/2012 1045   PLT 153 04/25/2012 1045   MCV 87.6 11/01/2012 1103   MCV 83.6 04/25/2012 1045   MCH 30.7 11/01/2012 1103   MCH 29.4 04/25/2012 1045   MCHC 35.1 11/01/2012 1103   MCHC 35.2 04/25/2012 1045   RDW 13.9 04/25/2012 1045   LYMPHSABS 1.5  04/25/2012 1045   MONOABS 0.6 04/25/2012 1045   EOSABS 0.1 04/25/2012 1045   BASOSABS 0.0 04/25/2012 1045       Assessment: 1.  Diabetes.  Poorly controlled due to non compliance 2.  Blood Pressure.  Elevated today 3.  Lipids.  LDL-P and Tg at goal but low HDL, 4.  Foot Care.  Normal.  Denies cuts or sores. 5.  Dental Care.  Not problems noted - pt only has 4 teeth.  Last dental visit greater than 1 year ago. 6.  Eye Care/Exam.  Last exam greater than 1 year ago. 7.  Medication review - non compliance with some medications due to cost 8. Anemia - HGB and HCT improved but still low 9. Vitamin D deficiency- labs drawn today-pending  Recommendations: 1.  Medication recommendations at this time are as follows:  Hold tradjenta until can appeal co pay.  Start amaryl 4mg  take 1 tablet with breakfast. 2.  Reviewed HBG goals:  Fasting 80-130 and 1-2 hour post prandial <180.  Patient is instructed to check BG 2 times per day.    3.  BP goal < 140/80.  4.  LDL goal of < 100, HDL > 40 and TG < 150. 5.  Eye Exam yearly and Dental Exam every 6 months. 6.  Dietary recommendations:  Review CHO counting 7.  Physical Activity recommendations:  As able 8.  Called insurance and started process of getting copay reduced for Tradjenta - Case #[id number] - pending 9.  Checking BMET, CBC and vitamin D today 10.  Restart OTC iron supplement 325mg  qd - recheck CBC in 1-2 months 11.  Return to clinic in 2 wks   Time spent counseling patient:  60 minutes     Henrene Pastor, PharmD, CPP

## 2012-11-06 ENCOUNTER — Telehealth: Payer: Self-pay | Admitting: Pharmacist

## 2012-11-06 NOTE — Telephone Encounter (Signed)
Start vitamin D 1000iu daily   Rech BMET - 1 month Reck - Vitamin D level - 3 months

## 2012-11-13 ENCOUNTER — Other Ambulatory Visit: Payer: Self-pay

## 2012-11-13 MED ORDER — AMLODIPINE BESYLATE 5 MG PO TABS: 5.0000 mg | ORAL_TABLET | Freq: Two times a day (BID) | ORAL | Status: DC

## 2012-11-22 ENCOUNTER — Ambulatory Visit (INDEPENDENT_AMBULATORY_CARE_PROVIDER_SITE_OTHER): Payer: Medicare Other | Admitting: Nurse Practitioner

## 2012-11-22 ENCOUNTER — Encounter: Payer: Self-pay | Admitting: Nurse Practitioner

## 2012-11-22 VITALS — BP 144/74 | HR 75 | Temp 97.1°F | Ht 71.5 in | Wt 261.0 lb

## 2012-11-22 DIAGNOSIS — D649 Anemia, unspecified: Secondary | ICD-10-CM

## 2012-11-22 DIAGNOSIS — E118 Type 2 diabetes mellitus with unspecified complications: Secondary | ICD-10-CM

## 2012-11-22 DIAGNOSIS — E1165 Type 2 diabetes mellitus with hyperglycemia: Secondary | ICD-10-CM

## 2012-11-22 LAB — POCT CBC
Granulocyte percent: 63.1 %G (ref 37–80)
HCT, POC: 37.3 % — AB (ref 43.5–53.7)
Hemoglobin: 12.5 g/dL — AB (ref 14.1–18.1)
POC Granulocyte: 3.9 (ref 2–6.9)

## 2012-11-22 LAB — POCT GLYCOSYLATED HEMOGLOBIN (HGB A1C): Hemoglobin A1C: 9.1

## 2012-11-22 NOTE — Progress Notes (Addendum)
  Subjective:    Patient ID: Frederick Foster, male    DOB: 10/04/1950, 62 y.o.   MRN: [id number]  HPI- Patient here today for a one monthe recheck- His blood sugars have been running high and changes have been made to medication- Here to check HGBA1C to see if it is trending downward. He also has anemia and we are to check CBC today.  * Fasting Blood sugars averaging 150's which is an improvement.  Review of Systems  Constitutional: Negative for activity change, appetite change and fatigue.  HENT: Negative.   Eyes: Negative.   Respiratory: Negative.   Cardiovascular: Negative.   Gastrointestinal: Negative.   Endocrine: Negative.   Genitourinary: Negative.   Musculoskeletal: Negative.   Neurological: Negative.   Hematological: Negative.   Psychiatric/Behavioral: Negative.        Objective:   Physical Exam  Constitutional: He is oriented to person, place, and time. He appears well-developed and well-nourished.  Cardiovascular: Normal rate, normal heart sounds and intact distal pulses.   Pulmonary/Chest: Effort normal and breath sounds normal.  Neurological: He is alert and oriented to person, place, and time. He has normal reflexes.  Skin: Skin is warm.   BP 144/74  Pulse 75  Temp(Src) 97.1 F (36.2 C) (Oral)  Ht 5' 11.5" (1.816 m)  Wt 261 lb (118.389 kg)  BMI 35.9 kg/m2 Results for orders placed in visit on 11/22/12  POCT CBC      Result Value Range   WBC 6.2  4.6 - 10.2 K/uL   Lymph, poc 1.8  0.6 - 3.4   POC LYMPH PERCENT 29..2  10 - 50 %L   POC Granulocyte 3.9  2 - 6.9   Granulocyte percent 63.1  37 - 80 %G   RBC 4.3 (*) 4.69 - 6.13 M/uL   Hemoglobin 12.5 (*) 14.1 - 18.1 g/dL   HCT, POC 13.0 (*) 86.5 - 53.7 %   MCV 86.6  80 - 97 fL   MCH, POC 29.1  27 - 31.2 pg   MCHC 33.6  31.8 - 35.4 g/dL   RDW, POC 78.4     Platelet Count, POC 173.0  142 - 424 K/uL   MPV 6.7  0 - 99.8 fL  POCT GLYCOSYLATED HEMOGLOBIN (HGB A1C)      Result Value Range   Hemoglobin A1C 9.1 %             Assessment & Plan:  Anemia  hgb down fro 13.1  Patient to get iron tablets filled today  Encouraged to eat foods rich in iron- Raisins Diabetes  HgbA1c Trending downward  Continue low carb diet  Continue all meds as RX  Keep diary of blood sugars.  Follow up in 3 months Mary-Margaret Daphine Deutscher, FNP

## 2012-11-24 ENCOUNTER — Other Ambulatory Visit: Payer: Self-pay | Admitting: *Deleted

## 2012-11-24 MED ORDER — CITALOPRAM HYDROBROMIDE 20 MG PO TABS: 20.0000 mg | ORAL_TABLET | Freq: Every day | ORAL | Status: DC

## 2012-12-05 ENCOUNTER — Other Ambulatory Visit: Payer: Self-pay | Admitting: *Deleted

## 2012-12-05 DIAGNOSIS — E119 Type 2 diabetes mellitus without complications: Secondary | ICD-10-CM

## 2012-12-05 MED ORDER — GLIMEPIRIDE 4 MG PO TABS: 4.0000 mg | ORAL_TABLET | Freq: Every day | ORAL | Status: DC

## 2012-12-12 DIAGNOSIS — C8589 Other specified types of non-Hodgkin lymphoma, extranodal and solid organ sites: Secondary | ICD-10-CM

## 2012-12-12 DIAGNOSIS — Z7901 Long term (current) use of anticoagulants: Secondary | ICD-10-CM

## 2012-12-15 ENCOUNTER — Other Ambulatory Visit: Payer: Self-pay | Admitting: *Deleted

## 2012-12-15 MED ORDER — ALPRAZOLAM 1 MG PO TABS: 1.0000 mg | ORAL_TABLET | Freq: Every evening | ORAL | Status: DC | PRN

## 2012-12-15 NOTE — Telephone Encounter (Signed)
Please call in xanax rx with 2 refils

## 2012-12-15 NOTE — Telephone Encounter (Signed)
PT HAS TWO STRENGTHS OF ALPRAZOLAM LISTED IN EPIC. ONCOLOGIST HAS REQUESTED WE REILL HIS MEDICATION. PLEASE REVIEW DOSES. LAST RF 09/25/12.  IF APPROVED CALL IN THE DRUG STORE. THANKS.

## 2012-12-15 NOTE — Telephone Encounter (Signed)
rx called into pharmacy

## 2012-12-19 ENCOUNTER — Other Ambulatory Visit: Payer: Self-pay | Admitting: *Deleted

## 2012-12-19 MED ORDER — FUROSEMIDE 20 MG PO TABS: 20.0000 mg | ORAL_TABLET | Freq: Every day | ORAL | Status: DC

## 2013-01-02 ENCOUNTER — Telehealth: Payer: Self-pay | Admitting: Nurse Practitioner

## 2013-01-02 ENCOUNTER — Encounter: Payer: Self-pay | Admitting: Physician Assistant

## 2013-01-02 ENCOUNTER — Ambulatory Visit (INDEPENDENT_AMBULATORY_CARE_PROVIDER_SITE_OTHER): Payer: Medicare Other | Admitting: Physician Assistant

## 2013-01-02 VITALS — BP 140/68 | HR 86 | Temp 98.6°F | Wt 262.0 lb

## 2013-01-02 DIAGNOSIS — R300 Dysuria: Secondary | ICD-10-CM

## 2013-01-02 MED ORDER — CIPROFLOXACIN HCL 500 MG PO TABS: 500.0000 mg | ORAL_TABLET | Freq: Two times a day (BID) | ORAL | Status: DC

## 2013-01-02 NOTE — Telephone Encounter (Signed)
APPT MADE

## 2013-01-02 NOTE — Patient Instructions (Signed)
Urinary Tract Infection  Urinary tract infections (UTIs) can develop anywhere along your urinary tract. Your urinary tract is your body's drainage system for removing wastes and extra water. Your urinary tract includes two kidneys, two ureters, a bladder, and a urethra. Your kidneys are a pair of bean-shaped organs. Each kidney is about the size of your fist. They are located below your ribs, one on each side of your spine.  CAUSES  Infections are caused by microbes, which are microscopic organisms, including fungi, viruses, and bacteria. These organisms are so small that they can only be seen through a microscope. Bacteria are the microbes that most commonly cause UTIs.  SYMPTOMS   Symptoms of UTIs may vary by age and gender of the patient and by the location of the infection. Symptoms in young women typically include a frequent and intense urge to urinate and a painful, burning feeling in the bladder or urethra during urination. Older women and men are more likely to be tired, shaky, and weak and have muscle aches and abdominal pain. A fever may mean the infection is in your kidneys. Other symptoms of a kidney infection include pain in your back or sides below the ribs, nausea, and vomiting.  DIAGNOSIS  To diagnose a UTI, your caregiver will ask you about your symptoms. Your caregiver also will ask to provide a urine sample. The urine sample will be tested for bacteria and white blood cells. White blood cells are made by your body to help fight infection.  TREATMENT   Typically, UTIs can be treated with medication. Because most UTIs are caused by a bacterial infection, they usually can be treated with the use of antibiotics. The choice of antibiotic and length of treatment depend on your symptoms and the type of bacteria causing your infection.  HOME CARE INSTRUCTIONS   If you were prescribed antibiotics, take them exactly as your caregiver instructs you. Finish the medication even if you feel better after you  have only taken some of the medication.   Drink enough water and fluids to keep your urine clear or pale yellow.   Avoid caffeine, tea, and carbonated beverages. They tend to irritate your bladder.   Empty your bladder often. Avoid holding urine for long periods of time.   Empty your bladder before and after sexual intercourse.   After a bowel movement, women should cleanse from front to back. Use each tissue only once.  SEEK MEDICAL CARE IF:    You have back pain.   You develop a fever.   Your symptoms do not begin to resolve within 3 days.  SEEK IMMEDIATE MEDICAL CARE IF:    You have severe back pain or lower abdominal pain.   You develop chills.   You have nausea or vomiting.   You have continued burning or discomfort with urination.  MAKE SURE YOU:    Understand these instructions.   Will watch your condition.   Will get help right away if you are not doing well or get worse.  Document Released: 04/07/2005 Document Revised: 12/28/2011 Document Reviewed: 08/06/2011  [employer] Patient Information 2014 [employer].

## 2013-01-02 NOTE — Progress Notes (Signed)
Subjective:     Patient ID: Frederick Foster, male   DOB: 09/02/1950, 62 y.o.   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       Objective:   Physical Exam  Nursing note and vitals reviewed. Lungs- CTA No CVAT Abd- Obese, soft, NT/ND, no masses/HSM Pt unable to give UA sample today     Assessment:     Dysuria      Plan:     Given upcoming surgery will go ahead with Cipro rx Due to single kidney will change to Cipro 500mg  q d- pharm called to clarify Cont other activities/meds F/U prn

## 2013-01-04 ENCOUNTER — Other Ambulatory Visit: Payer: Self-pay

## 2013-01-04 MED ORDER — ALPRAZOLAM 0.25 MG PO TABS: 0.2500 mg | ORAL_TABLET | Freq: Every day | ORAL | Status: DC | PRN

## 2013-01-04 NOTE — Telephone Encounter (Signed)
Last seen  01/02/13   If approved call in and have nurse notify patient

## 2013-01-15 DIAGNOSIS — I2510 Atherosclerotic heart disease of native coronary artery without angina pectoris: Secondary | ICD-10-CM | POA: Insufficient documentation

## 2013-01-17 DIAGNOSIS — N289 Disorder of kidney and ureter, unspecified: Secondary | ICD-10-CM | POA: Insufficient documentation

## 2013-02-23 ENCOUNTER — Encounter: Payer: Self-pay | Admitting: Nurse Practitioner

## 2013-02-23 ENCOUNTER — Ambulatory Visit (INDEPENDENT_AMBULATORY_CARE_PROVIDER_SITE_OTHER): Payer: Medicare Other | Admitting: Nurse Practitioner

## 2013-02-23 VITALS — BP 148/65 | HR 59 | Temp 98.3°F | Ht 71.0 in | Wt 234.0 lb

## 2013-02-23 DIAGNOSIS — R799 Abnormal finding of blood chemistry, unspecified: Secondary | ICD-10-CM

## 2013-02-23 DIAGNOSIS — F418 Other specified anxiety disorders: Secondary | ICD-10-CM

## 2013-02-23 DIAGNOSIS — E785 Hyperlipidemia, unspecified: Secondary | ICD-10-CM

## 2013-02-23 DIAGNOSIS — R7989 Other specified abnormal findings of blood chemistry: Secondary | ICD-10-CM

## 2013-02-23 DIAGNOSIS — I10 Essential (primary) hypertension: Secondary | ICD-10-CM

## 2013-02-23 DIAGNOSIS — D649 Anemia, unspecified: Secondary | ICD-10-CM

## 2013-02-23 DIAGNOSIS — F341 Dysthymic disorder: Secondary | ICD-10-CM

## 2013-02-23 DIAGNOSIS — E119 Type 2 diabetes mellitus without complications: Secondary | ICD-10-CM

## 2013-02-23 DIAGNOSIS — C679 Malignant neoplasm of bladder, unspecified: Secondary | ICD-10-CM

## 2013-02-23 LAB — POCT CBC
Granulocyte percent: 58.3 %G (ref 37–80)
HCT, POC: 29.1 % — AB (ref 43.5–53.7)
MPV: 7.2 fL (ref 0–99.8)
POC Granulocyte: 4 (ref 2–6.9)
POC LYMPH PERCENT: 33.7 %L (ref 10–50)
Platelet Count, POC: 280 10*3/uL (ref 142–424)
RDW, POC: 16.4 %

## 2013-02-23 MED ORDER — FUROSEMIDE 20 MG PO TABS: 20.0000 mg | ORAL_TABLET | Freq: Every day | ORAL | Status: DC

## 2013-02-23 NOTE — Patient Instructions (Addendum)
Health Maintenance, Males A healthy lifestyle and preventative care can promote health and wellness.  Maintain regular health, dental, and eye exams.  Eat a healthy diet. Foods like vegetables, fruits, whole grains, low-fat dairy products, and lean protein foods contain the nutrients you need without too many calories. Decrease your intake of foods high in solid fats, added sugars, and salt. Get information about a proper diet from your caregiver, if necessary.  Regular physical exercise is one of the most important things you can do for your health. Most adults should get at least 150 minutes of moderate-intensity exercise (any activity that increases your heart rate and causes you to sweat) each week. In addition, most adults need muscle-strengthening exercises on 2 or more days a week.   Maintain a healthy weight. The body mass index (BMI) is a screening tool to identify possible weight problems. It provides an estimate of body fat based on height and weight. Your caregiver can help determine your BMI, and can help you achieve or maintain a healthy weight. For adults 20 years and older:  A BMI below 18.5 is considered underweight.  A BMI of 18.5 to 24.9 is normal.  A BMI of 25 to 29.9 is considered overweight.  A BMI of 30 and above is considered obese.  Maintain normal blood lipids and cholesterol by exercising and minimizing your intake of saturated fat. Eat a balanced diet with plenty of fruits and vegetables. Blood tests for lipids and cholesterol should begin at age 20 and be repeated every 5 years. If your lipid or cholesterol levels are high, you are over 50, or you are a high risk for heart disease, you may need your cholesterol levels checked more frequently.Ongoing high lipid and cholesterol levels should be treated with medicines, if diet and exercise are not effective.  If you smoke, find out from your caregiver how to quit. If you do not use tobacco, do not start.  If you  choose to drink alcohol, do not exceed 2 drinks per day. One drink is considered to be 12 ounces (355 mL) of beer, 5 ounces (148 mL) of wine, or 1.5 ounces (44 mL) of liquor.  Avoid use of street drugs. Do not share needles with anyone. Ask for help if you need support or instructions about stopping the use of drugs.  High blood pressure causes heart disease and increases the risk of stroke. Blood pressure should be checked at least every 1 to 2 years. Ongoing high blood pressure should be treated with medicines if weight loss and exercise are not effective.  If you are 45 to 64 years old, ask your caregiver if you should take aspirin to prevent heart disease.  Diabetes screening involves taking a blood sample to check your fasting blood sugar level. This should be done once every 3 years, after age 45, if you are within normal weight and without risk factors for diabetes. Testing should be considered at a younger age or be carried out more frequently if you are overweight and have at least 1 risk factor for diabetes.  Colorectal cancer can be detected and often prevented. Most routine colorectal cancer screening begins at the age of 50 and continues through age 75. However, your caregiver may recommend screening at an earlier age if you have risk factors for colon cancer. On a yearly basis, your caregiver may provide home test kits to check for hidden blood in the stool. Use of a small camera at the end of a tube,   to directly examine the colon (sigmoidoscopy or colonoscopy), can detect the earliest forms of colorectal cancer. Talk to your caregiver about this at age 50, when routine screening begins. Direct examination of the colon should be repeated every 5 to 10 years through age 75, unless early forms of pre-cancerous polyps or small growths are found.  Hepatitis C blood testing is recommended for all people born from 1945 through 1965 and any individual with known risks for hepatitis C.  Healthy  men should no longer receive prostate-specific antigen (PSA) blood tests as part of routine cancer screening. Consult with your caregiver about prostate cancer screening.  Testicular cancer screening is not recommended for adolescents or adult males who have no symptoms. Screening includes self-exam, caregiver exam, and other screening tests. Consult with your caregiver about any symptoms you have or any concerns you have about testicular cancer.  Practice safe sex. Use condoms and avoid high-risk sexual practices to reduce the spread of sexually transmitted infections (STIs).  Use sunscreen with a sun protection factor (SPF) of 30 or greater. Apply sunscreen liberally and repeatedly throughout the day. You should seek shade when your shadow is shorter than you. Protect yourself by wearing long sleeves, pants, a wide-brimmed hat, and sunglasses year round, whenever you are outdoors.  Notify your caregiver of new moles or changes in moles, especially if there is a change in shape or color. Also notify your caregiver if a mole is larger than the size of a pencil eraser.  A one-time screening for abdominal aortic aneurysm (AAA) and surgical repair of large AAAs by sound wave imaging (ultrasonography) is recommended for ages 65 to 75 years who are current or former smokers.  Stay current with your immunizations. Document Released: 12/25/2007 Document Revised: 09/20/2011 Document Reviewed: 11/23/2010 [employer] Patient Information 2014 [employer].  

## 2013-02-23 NOTE — Progress Notes (Signed)
Subjective:    Patient ID: Frederick Foster, male    DOB: 03/25/1951, 62 y.o.   MRN: [id number]  HPI Patient here today for follow up of chronic medical problems- he recently had bladder and prostate removed due to cancer- WHile recovering he coughed and his abdomen split open and intestine came out- Is currently healing by secondary intentions. He also has a cystostomy- During all this time they stopped his insulin and he was only on oral meds for diabetes- Suppose to start back on insulin n August 22nd- He has been trying to watch diet- BS have been running over 200 most if the time. He has no complaints today. Prior to surgery he had a temporary pacemaker put in and is going to have to go back and get a permanent pacemaker put in. Patient Active Problem List   Diagnosis Date Noted  . Bladder cancer 02/23/2013  . Depression with anxiety 10/23/2012  . Bradycardia 04/26/2012  . Chronic diastolic CHF (congestive heart failure) 04/26/2012  . CKD (chronic kidney disease), stage III 04/26/2012  . CHB (complete heart block) 04/25/2012  . Acute on chronic diastolic heart failure 02/09/2012  . Atrial flutter 11/19/2011  . Lymphoma 06/22/2011  . DM (diabetes mellitus), type 2, uncontrolled 05/17/2011  . Neck swelling 05/16/2011  . Solitary kidney 05/16/2011  . Hyponatremia 05/16/2011  . ARF (acute renal failure) 05/16/2011  . HTN (hypertension) 05/13/2011  . AODM 10/22/2008  . Mixed hyperlipidemia 10/22/2008  . SMOKER 10/22/2008  . Second degree Mobitz I AV block 10/22/2008  . HERNIA, VENTRAL 10/22/2008  . CAROTID BRUIT 10/22/2008  . CORONARY ARTERY BYPASS GRAFT, TWO VESSEL, HX OF 10/22/2008   Outpatient Encounter Prescriptions as of 02/23/2013  Medication Sig Dispense Refill  . ALPRAZolam (XANAX) 0.25 MG tablet Take 1 tablet (0.25 mg total) by mouth daily as needed. For anxiety  30 tablet  0  . ALPRAZolam (XANAX) 1 MG tablet Take 1 tablet (1 mg total) by mouth at bedtime as needed. For sleep   30 tablet  0  . amLODipine (NORVASC) 5 MG tablet Take 1 tablet (5 mg total) by mouth 2 (two) times daily.  60 tablet  6  . aspirin EC 81 MG tablet Take 81 mg by mouth daily.      . ciprofloxacin (CIPRO) 500 MG tablet Take 1 tablet (500 mg total) by mouth 2 (two) times daily.  14 tablet  0  . citalopram (CELEXA) 20 MG tablet Take 1 tablet (20 mg total) by mouth daily.  30 tablet  2  . ergocalciferol (VITAMIN D2) 50000 UNITS capsule Take 50,000 Units by mouth once a week.      . ferrous sulfate 325 (65 FE) MG tablet Take 650 mg by mouth daily with breakfast.      . furosemide (LASIX) 20 MG tablet Take 1 tablet (20 mg total) by mouth daily.  30 tablet  4  . glimepiride (AMARYL) 4 MG tablet Take 1 tablet (4 mg total) by mouth daily before breakfast.  30 tablet  2  . HYDROcodone-acetaminophen (NORCO/VICODIN) 5-325 MG per tablet Take 1 tablet by mouth every 6 (six) hours as needed. For pain      . insulin detemir (LEVEMIR FLEXPEN) 100 UNIT/ML injection Inject 45 Units into the skin 2 (two) times daily.       Marland Kitchen lisinopril (PRINIVIL,ZESTRIL) 40 MG tablet Take 40 mg by mouth daily.      . rosuvastatin (CRESTOR) 5 MG tablet Take 5 mg by mouth at  bedtime.       . Tamsulosin HCl (FLOMAX) 0.4 MG CAPS Take 0.4 mg by mouth 2 (two) times daily.        Facility-Administered Encounter Medications as of 02/23/2013  Medication Dose Route Frequency Provider Last Rate Last Dose  . 0.9 %  sodium chloride infusion  250 mL Intravenous PRN Hillis Range, MD      . benzocaine (HURRICAINE) 20 % mouth spray 1 application  1 application Mouth/Throat PRN Hillis Range, MD      . fentaNYL (SUBLIMAZE) injection 250 mcg  250 mcg Intravenous Once Hillis Range, MD      . midazolam (VERSED) injection 10 mg  10 mg Intravenous Once Hillis Range, MD           Review of Systems     Objective:   Physical Exam  Constitutional: He is oriented to person, place, and time. He appears well-developed and well-nourished.  HENT:  Head:  Normocephalic.  Right Ear: External ear normal.  Left Ear: External ear normal.  Nose: Nose normal.  Mouth/Throat: Oropharynx is clear and moist.  Eyes: EOM are normal. Pupils are equal, round, and reactive to light.  Neck: Normal range of motion. Neck supple. No thyromegaly present.  Cardiovascular: Normal rate, regular rhythm, normal heart sounds and intact distal pulses.   No murmur heard. Pulmonary/Chest: Effort normal and breath sounds normal. He has no wheezes. He has no rales.  Abdominal: Soft. Bowel sounds are normal.  Ostomy stump no sign of infection- pink Dressing to open abdominal wound in place- not removed  Genitourinary: Prostate normal and penis normal.  Musculoskeletal: Normal range of motion.  Neurological: He is alert and oriented to person, place, and time.  Skin: Skin is warm and dry.  Psychiatric: He has a normal mood and affect. His behavior is normal. Judgment and thought content normal.   BP 148/65  Pulse 59  Temp(Src) 98.3 F (36.8 C) (Oral)  Ht 5\' 11"  (1.803 m)  Wt 234 lb (106.142 kg)  BMI 32.65 kg/m2  Results for orders placed in visit on 02/23/13  POCT GLYCOSYLATED HEMOGLOBIN (HGB A1C)      Result Value Range   Hemoglobin A1C 6.5%           Assessment & Plan:   1. Diabetes   2. Bladder cancer   3. Anemia   4. Elevated serum creatinine   5. Chronic kidney disease   6. Depression with anxiety   7. S/P bladder and prostate removal 8. Temporary pacemaker 9. Abdominal wound dehiscence   Orders Placed This Encounter  Procedures  . NMR, lipoprofile  . CMP14+EGFR  . POCT glycosylated hemoglobin (Hb A1C)  . POCT CBC   Continue all meds Diet encouraged Watch carbs so HgbA1c and blood sugars dont go up Keep follow up appointment with surgeon RTO prn  Mary-Margaret Daphine Deutscher, FNP

## 2013-02-25 LAB — NMR, LIPOPROFILE
Cholesterol: 129 mg/dL (ref <200)
LDL Particle Number: 1081 nmol/L — ABNORMAL HIGH (ref <1000)
LDLC SERPL CALC-MCNC: 60 mg/dL (ref <100)
LP-IR Score: 61 — ABNORMAL HIGH (ref <=45)
Triglycerides by NMR: 159 mg/dL — ABNORMAL HIGH (ref <150)

## 2013-02-25 LAB — CMP14+EGFR
AST: 17 IU/L (ref 0–40)
Albumin/Globulin Ratio: 1 — ABNORMAL LOW (ref 1.1–2.5)
Albumin: 3.3 g/dL — ABNORMAL LOW (ref 3.6–4.8)
BUN/Creatinine Ratio: 14 (ref 10–22)
BUN: 27 mg/dL (ref 8–27)
Calcium: 9.2 mg/dL (ref 8.6–10.2)
Creatinine, Ser: 1.91 mg/dL — ABNORMAL HIGH (ref 0.76–1.27)
GFR calc non Af Amer: 36 mL/min/{1.73_m2} — ABNORMAL LOW (ref >59)
Globulin, Total: 3.2 g/dL (ref 1.5–4.5)
Sodium: 131 mmol/L — ABNORMAL LOW (ref 134–144)

## 2013-03-06 ENCOUNTER — Other Ambulatory Visit (INDEPENDENT_AMBULATORY_CARE_PROVIDER_SITE_OTHER): Payer: Medicare Other

## 2013-03-06 DIAGNOSIS — R5381 Other malaise: Secondary | ICD-10-CM

## 2013-03-06 DIAGNOSIS — R7989 Other specified abnormal findings of blood chemistry: Secondary | ICD-10-CM

## 2013-03-07 ENCOUNTER — Other Ambulatory Visit: Payer: Self-pay | Admitting: Family Medicine

## 2013-03-07 ENCOUNTER — Other Ambulatory Visit (INDEPENDENT_AMBULATORY_CARE_PROVIDER_SITE_OTHER): Payer: Medicare Other

## 2013-03-07 ENCOUNTER — Telehealth: Payer: Self-pay | Admitting: *Deleted

## 2013-03-07 DIAGNOSIS — R6889 Other general symptoms and signs: Secondary | ICD-10-CM

## 2013-03-07 DIAGNOSIS — E875 Hyperkalemia: Secondary | ICD-10-CM

## 2013-03-07 DIAGNOSIS — R7989 Other specified abnormal findings of blood chemistry: Secondary | ICD-10-CM

## 2013-03-07 LAB — BMP8+EGFR
BUN: 41 mg/dL — ABNORMAL HIGH (ref 8–27)
CO2: 15 mmol/L — ABNORMAL LOW (ref 18–29)
Calcium: 8.9 mg/dL (ref 8.6–10.2)
Chloride: 105 mmol/L (ref 97–108)
GFR calc Af Amer: 49 mL/min/{1.73_m2} — ABNORMAL LOW (ref >59)
Glucose: 162 mg/dL — ABNORMAL HIGH (ref 65–99)
Potassium: 7.2 mmol/L (ref 3.5–5.2)

## 2013-03-07 LAB — ANEMIA PROFILE B
Basos: 1 % (ref 0–3)
Eos: 2 % (ref 0–5)
HCT: 31.1 % — ABNORMAL LOW (ref 37.5–51.0)
Hemoglobin: 10.3 g/dL — ABNORMAL LOW (ref 12.6–17.7)
Immature Granulocytes: 0 % (ref 0–2)
Lymphocytes Absolute: 2.8 10*3/uL (ref 0.7–3.1)
MCH: 28.4 pg (ref 26.6–33.0)
MCV: 86 fL (ref 79–97)
Monocytes Absolute: 0.6 10*3/uL (ref 0.1–0.9)
Monocytes: 9 % (ref 4–12)
RBC: 3.63 x10E6/uL — ABNORMAL LOW (ref 4.14–5.80)
TIBC: 247 ug/dL — ABNORMAL LOW (ref 250–450)
UIBC: 195 ug/dL (ref 150–375)
WBC: 6.9 10*3/uL (ref 3.4–10.8)

## 2013-03-07 LAB — POTASSIUM: Potassium: 7.4 mmol/L (ref 3.5–5.2)

## 2013-03-07 NOTE — Telephone Encounter (Signed)
JACKLINE FROM [facility] CALLED STAT K LEVEL OF 7.4. RESULTS GIVEN TO MARY MARTIN. CALLED PT AND SPOKE WITH LINDA HIS WIFE AND INSTRUCTED TO GO TO ER PER MARY MARTIN. WIFE VERBALIZED UNDERSTANDING AND WOULD GO TO [facility] ER. I CALLED TRIAGE NURSE, TRACEY AT [facility] AND GAVE RESULTS AND REPORT AND RESULTS FAXED TO [phone]. LABS FAXED WITH TODAYS K LEVEL AND RECENT K LEVELS. I CALLED BACK AND SPOKE WITH FRANCISA AT [facility] ER AND SHE CONFIRMED SHE DID RECEIVE K LEVELS THAT I FAXED.

## 2013-03-13 ENCOUNTER — Other Ambulatory Visit (INDEPENDENT_AMBULATORY_CARE_PROVIDER_SITE_OTHER): Payer: Medicare Other

## 2013-03-13 ENCOUNTER — Telehealth: Payer: Self-pay | Admitting: *Deleted

## 2013-03-13 DIAGNOSIS — E875 Hyperkalemia: Secondary | ICD-10-CM

## 2013-03-13 DIAGNOSIS — N19 Unspecified kidney failure: Secondary | ICD-10-CM

## 2013-03-13 LAB — BMP8+EGFR
BUN/Creatinine Ratio: 16 (ref 10–22)
BUN: 20 mg/dL (ref 8–27)
CO2: 18 mmol/L (ref 18–29)
Calcium: 8.7 mg/dL (ref 8.6–10.2)
Chloride: 112 mmol/L — ABNORMAL HIGH (ref 96–108)
Creatinine, Ser: 1.26 mg/dL (ref 0.76–1.27)
GFR calc Af Amer: 70 mL/min/{1.73_m2} (ref >59)
GFR calc non Af Amer: 60 mL/min/{1.73_m2} (ref >59)
Glucose: 158 mg/dL — ABNORMAL HIGH (ref 65–99)
Potassium: 4.6 mmol/L (ref 3.5–5.2)
Sodium: 138 mmol/L (ref 134–144)

## 2013-03-13 NOTE — Telephone Encounter (Signed)
Patient was sent to the ED at [facility] on 8/27 for hyperkalemia.  He was discharged on a renal diet and told to return here for repeat potassium level and referral to nephrology.  BMP drawn STAT this morning.  He will need to know whether to continue taking Kayexalate.  We will notify him once the results are back.

## 2013-03-14 NOTE — Progress Notes (Signed)
Patient came in for labs only.

## 2013-03-14 NOTE — Telephone Encounter (Signed)
Potassium results are WNL. His last dose of Kayexalate was on 03/09/13. We will proceed with nephrology referral.

## 2013-03-17 ENCOUNTER — Other Ambulatory Visit: Payer: Self-pay | Admitting: *Deleted

## 2013-03-17 MED ORDER — CITALOPRAM HYDROBROMIDE 20 MG PO TABS: 20.0000 mg | ORAL_TABLET | Freq: Every day | ORAL | Status: DC

## 2013-03-17 NOTE — Telephone Encounter (Signed)
Last filled 01/08/13, last seen 02/23/13. Route to pool B so [facility]

## 2013-03-19 MED ORDER — ALPRAZOLAM 1 MG PO TABS: 1.0000 mg | ORAL_TABLET | Freq: Every evening | ORAL | Status: DC | PRN

## 2013-03-19 NOTE — Telephone Encounter (Signed)
Please call in xanax rx 

## 2013-03-19 NOTE — Telephone Encounter (Signed)
Patient aware.

## 2013-04-05 ENCOUNTER — Encounter: Payer: Self-pay | Admitting: Nurse Practitioner

## 2013-04-05 ENCOUNTER — Telehealth: Payer: Self-pay | Admitting: Nurse Practitioner

## 2013-04-05 ENCOUNTER — Ambulatory Visit (INDEPENDENT_AMBULATORY_CARE_PROVIDER_SITE_OTHER): Payer: Medicare Other | Admitting: Nurse Practitioner

## 2013-04-05 VITALS — BP 146/61 | HR 55 | Temp 97.1°F | Ht 71.0 in | Wt 221.0 lb

## 2013-04-05 DIAGNOSIS — R0989 Other specified symptoms and signs involving the circulatory and respiratory systems: Secondary | ICD-10-CM

## 2013-04-05 DIAGNOSIS — R5383 Other fatigue: Secondary | ICD-10-CM

## 2013-04-05 DIAGNOSIS — I5032 Chronic diastolic (congestive) heart failure: Secondary | ICD-10-CM

## 2013-04-05 DIAGNOSIS — F341 Dysthymic disorder: Secondary | ICD-10-CM

## 2013-04-05 DIAGNOSIS — E875 Hyperkalemia: Secondary | ICD-10-CM

## 2013-04-05 DIAGNOSIS — E1165 Type 2 diabetes mellitus with hyperglycemia: Secondary | ICD-10-CM

## 2013-04-05 DIAGNOSIS — I509 Heart failure, unspecified: Secondary | ICD-10-CM

## 2013-04-05 DIAGNOSIS — F418 Other specified anxiety disorders: Secondary | ICD-10-CM

## 2013-04-05 DIAGNOSIS — R5381 Other malaise: Secondary | ICD-10-CM

## 2013-04-05 DIAGNOSIS — E782 Mixed hyperlipidemia: Secondary | ICD-10-CM

## 2013-04-05 DIAGNOSIS — I5033 Acute on chronic diastolic (congestive) heart failure: Secondary | ICD-10-CM

## 2013-04-05 DIAGNOSIS — Z951 Presence of aortocoronary bypass graft: Secondary | ICD-10-CM

## 2013-04-05 DIAGNOSIS — I10 Essential (primary) hypertension: Secondary | ICD-10-CM

## 2013-04-05 LAB — POCT GLYCOSYLATED HEMOGLOBIN (HGB A1C): Hemoglobin A1C: 5.9

## 2013-04-05 NOTE — Telephone Encounter (Signed)
APPT MADE

## 2013-04-05 NOTE — Progress Notes (Signed)
Subjective:    Patient ID: Frederick Foster, male    DOB: 07-22-1950, 62 y.o.   MRN: [id number]  HPI Patient went to hospital to get bandage changed from pacemaker and they thought he was having a heart attack and they started doing all kinds of stuff to him and they kept  Him over night for abnormal rhythym- When he went home 2 days ago he was confused about what meds he is suppose to be taking so he stopped all of his meds- he is here for me to go over meds so he know s what hs is suppose to be taking and how.  * pateint fell Friday and landed on left flank- last Friday- went to er- no fractures  Review of Systems  Constitutional: Positive for appetite change (decreased). Negative for chills.  Respiratory: Negative.   Cardiovascular: Positive for chest pain (put uncheagd since his fall last week).  Genitourinary: Negative.   Neurological: Positive for weakness and headaches.       Objective:   Physical Exam  Constitutional: He is oriented to person, place, and time. He appears well-developed and well-nourished.  HENT:  Head: Normocephalic.  Right Ear: External ear normal.  Left Ear: External ear normal.  Nose: Nose normal.  Mouth/Throat: Oropharynx is clear and moist.  Eyes: EOM are normal. Pupils are equal, round, and reactive to light.  Neck: Normal range of motion. Neck supple. No thyromegaly present.  Cardiovascular: Normal rate, regular rhythm, normal heart sounds and intact distal pulses.   No murmur heard. Pulmonary/Chest: Effort normal and breath sounds normal. He has no wheezes. He has no rales.  Left flank tenderness on palpation  Abdominal: Soft. Bowel sounds are normal.  Genitourinary: Prostate normal and penis normal.  Musculoskeletal: Normal range of motion.  Neurological: He is alert and oriented to person, place, and time.  Skin: Skin is warm and dry.  superficial scrape to right knee  Psychiatric: He has a normal mood and affect. His behavior is normal.  Judgment and thought content normal.    BP 146/61  Pulse 55  Temp(Src) 97.1 F (36.2 C) (Oral)  Ht 5\' 11"  (1.803 m)  Wt 221 lb (100.245 kg)  BMI 30.84 kg/m2       Assessment & Plan:   1. HTN (hypertension)   2. DM (diabetes mellitus), type 2, uncontrolled   3. Depression with anxiety   4. CORONARY ARTERY BYPASS GRAFT, TWO VESSEL, HX OF   5. Mixed hyperlipidemia   6. CAROTID BRUIT   7. CKD (chronic kidney disease), stage III   8. Chronic diastolic CHF (congestive heart failure)   9. Acute on chronic diastolic heart failure   10. Fatigue    Orders Placed This Encounter  Procedures  . CMP14+EGFR  . NMR, lipoprofile  . Anemia Profile B  . POCT glycosylated hemoglobin (Hb A1C)     Medication List       This list is accurate as of: 04/05/13  3:15 PM.  Always use your most recent med list.               ALPRAZolam 0.25 MG tablet  Commonly known as:  XANAX  Take 1 tablet (0.25 mg total) by mouth daily as needed. For anxiety     amLODipine 5 MG tablet  Commonly known as:  NORVASC  Take 5 mg by mouth daily.     aspirin EC 81 MG tablet  Take 81 mg by mouth daily.     citalopram  20 MG tablet  Commonly known as:  CELEXA  Take 1 tablet (20 mg total) by mouth daily.     furosemide 20 MG tablet  Commonly known as:  LASIX  Take 1 tablet (20 mg total) by mouth daily.     glimepiride 4 MG tablet  Commonly known as:  AMARYL  Take 1 tablet (4 mg total) by mouth daily before breakfast.     HYDROcodone-acetaminophen 5-325 MG per tablet  Commonly known as:  NORCO/VICODIN  Take 1 tablet by mouth every 6 (six) hours as needed. For pain     isosorbide mononitrate 30 MG 24 hr tablet  Commonly known as:  IMDUR  Take 30 mg by mouth daily.     LEVEMIR FLEXPEN 100 UNIT/ML injection  Generic drug:  insulin detemir  Inject 45 Units into the skin 2 (two) times daily.     lisinopril 40 MG tablet  Commonly known as:  PRINIVIL,ZESTRIL  Take 40 mg by mouth daily.      rosuvastatin 5 MG tablet  Commonly known as:  CRESTOR  Take 5 mg by mouth at bedtime.     tamsulosin 0.4 MG Caps capsule  Commonly known as:  FLOMAX  Take 0.4 mg by mouth 2 (two) times daily.        Continue all meds Labs pending Diet and exercise encouraged Health maintenance reviewed Follow up in 3 months and prn  Mary-Margaret Daphine Deutscher, FNP

## 2013-04-05 NOTE — Patient Instructions (Signed)

## 2013-04-06 ENCOUNTER — Encounter (HOSPITAL_COMMUNITY): Payer: Self-pay

## 2013-04-06 ENCOUNTER — Inpatient Hospital Stay (HOSPITAL_COMMUNITY)
Admission: EM | Admit: 2013-04-06 | Discharge: 2013-04-09 | DRG: 640 | Disposition: A | Discharge status: Home Health | Payer: Medicare Other | Attending: Internal Medicine | Admitting: Internal Medicine

## 2013-04-06 ENCOUNTER — Telehealth: Payer: Self-pay | Admitting: *Deleted

## 2013-04-06 ENCOUNTER — Other Ambulatory Visit (INDEPENDENT_AMBULATORY_CARE_PROVIDER_SITE_OTHER): Payer: Medicare Other

## 2013-04-06 ENCOUNTER — Telehealth: Payer: Self-pay | Admitting: Internal Medicine

## 2013-04-06 ENCOUNTER — Emergency Department (HOSPITAL_COMMUNITY): Payer: Medicare Other

## 2013-04-06 DIAGNOSIS — S31109A Unspecified open wound of abdominal wall, unspecified quadrant without penetration into peritoneal cavity, initial encounter: Secondary | ICD-10-CM

## 2013-04-06 DIAGNOSIS — E669 Obesity, unspecified: Secondary | ICD-10-CM | POA: Diagnosis present

## 2013-04-06 DIAGNOSIS — E782 Mixed hyperlipidemia: Secondary | ICD-10-CM | POA: Diagnosis present

## 2013-04-06 DIAGNOSIS — I4892 Unspecified atrial flutter: Secondary | ICD-10-CM | POA: Diagnosis present

## 2013-04-06 DIAGNOSIS — Z79899 Other long term (current) drug therapy: Secondary | ICD-10-CM

## 2013-04-06 DIAGNOSIS — E872 Acidosis, unspecified: Secondary | ICD-10-CM | POA: Diagnosis present

## 2013-04-06 DIAGNOSIS — E1165 Type 2 diabetes mellitus with hyperglycemia: Secondary | ICD-10-CM

## 2013-04-06 DIAGNOSIS — R7989 Other specified abnormal findings of blood chemistry: Secondary | ICD-10-CM

## 2013-04-06 DIAGNOSIS — F418 Other specified anxiety disorders: Secondary | ICD-10-CM | POA: Diagnosis present

## 2013-04-06 DIAGNOSIS — C61 Malignant neoplasm of prostate: Secondary | ICD-10-CM | POA: Diagnosis present

## 2013-04-06 DIAGNOSIS — Z905 Acquired absence of kidney: Secondary | ICD-10-CM

## 2013-04-06 DIAGNOSIS — IMO0001 Reserved for inherently not codable concepts without codable children: Secondary | ICD-10-CM | POA: Diagnosis present

## 2013-04-06 DIAGNOSIS — F341 Dysthymic disorder: Secondary | ICD-10-CM | POA: Diagnosis present

## 2013-04-06 DIAGNOSIS — C679 Malignant neoplasm of bladder, unspecified: Secondary | ICD-10-CM | POA: Diagnosis present

## 2013-04-06 DIAGNOSIS — Z906 Acquired absence of other parts of urinary tract: Secondary | ICD-10-CM

## 2013-04-06 DIAGNOSIS — F410 Panic disorder [episodic paroxysmal anxiety] without agoraphobia: Secondary | ICD-10-CM | POA: Diagnosis present

## 2013-04-06 DIAGNOSIS — I129 Hypertensive chronic kidney disease with stage 1 through stage 4 chronic kidney disease, or unspecified chronic kidney disease: Secondary | ICD-10-CM | POA: Diagnosis present

## 2013-04-06 DIAGNOSIS — Z6832 Body mass index (BMI) 32.0-32.9, adult: Secondary | ICD-10-CM

## 2013-04-06 DIAGNOSIS — C8589 Other specified types of non-Hodgkin lymphoma, extranodal and solid organ sites: Secondary | ICD-10-CM | POA: Diagnosis present

## 2013-04-06 DIAGNOSIS — IMO0002 Reserved for concepts with insufficient information to code with codable children: Secondary | ICD-10-CM

## 2013-04-06 DIAGNOSIS — I252 Old myocardial infarction: Secondary | ICD-10-CM

## 2013-04-06 DIAGNOSIS — N390 Urinary tract infection, site not specified: Secondary | ICD-10-CM | POA: Diagnosis present

## 2013-04-06 DIAGNOSIS — Z87891 Personal history of nicotine dependence: Secondary | ICD-10-CM

## 2013-04-06 DIAGNOSIS — Q600 Renal agenesis, unilateral: Secondary | ICD-10-CM

## 2013-04-06 DIAGNOSIS — E875 Hyperkalemia: Principal | ICD-10-CM | POA: Diagnosis present

## 2013-04-06 DIAGNOSIS — E1129 Type 2 diabetes mellitus with other diabetic kidney complication: Secondary | ICD-10-CM | POA: Diagnosis present

## 2013-04-06 DIAGNOSIS — I5032 Chronic diastolic (congestive) heart failure: Secondary | ICD-10-CM

## 2013-04-06 DIAGNOSIS — D631 Anemia in chronic kidney disease: Secondary | ICD-10-CM | POA: Diagnosis present

## 2013-04-06 DIAGNOSIS — Z9079 Acquired absence of other genital organ(s): Secondary | ICD-10-CM

## 2013-04-06 DIAGNOSIS — I4891 Unspecified atrial fibrillation: Secondary | ICD-10-CM | POA: Diagnosis present

## 2013-04-06 DIAGNOSIS — I442 Atrioventricular block, complete: Secondary | ICD-10-CM | POA: Diagnosis present

## 2013-04-06 DIAGNOSIS — E43 Unspecified severe protein-calorie malnutrition: Secondary | ICD-10-CM | POA: Insufficient documentation

## 2013-04-06 DIAGNOSIS — E871 Hypo-osmolality and hyponatremia: Secondary | ICD-10-CM | POA: Diagnosis present

## 2013-04-06 DIAGNOSIS — I10 Essential (primary) hypertension: Secondary | ICD-10-CM | POA: Diagnosis present

## 2013-04-06 DIAGNOSIS — N179 Acute kidney failure, unspecified: Secondary | ICD-10-CM | POA: Diagnosis present

## 2013-04-06 DIAGNOSIS — I2510 Atherosclerotic heart disease of native coronary artery without angina pectoris: Secondary | ICD-10-CM | POA: Diagnosis present

## 2013-04-06 DIAGNOSIS — Z951 Presence of aortocoronary bypass graft: Secondary | ICD-10-CM

## 2013-04-06 DIAGNOSIS — N183 Chronic kidney disease, stage 3 unspecified: Secondary | ICD-10-CM | POA: Diagnosis present

## 2013-04-06 DIAGNOSIS — Z950 Presence of cardiac pacemaker: Secondary | ICD-10-CM | POA: Diagnosis present

## 2013-04-06 DIAGNOSIS — E861 Hypovolemia: Secondary | ICD-10-CM | POA: Diagnosis present

## 2013-04-06 DIAGNOSIS — Z932 Ileostomy status: Secondary | ICD-10-CM

## 2013-04-06 HISTORY — DX: Unspecified open wound of abdominal wall, unspecified quadrant without penetration into peritoneal cavity, initial encounter: S31.109A

## 2013-04-06 HISTORY — DX: Chronic kidney disease, stage 3 unspecified: N18.30

## 2013-04-06 HISTORY — DX: Chronic kidney disease, stage 3 (moderate): N18.3

## 2013-04-06 HISTORY — DX: Acute kidney failure, unspecified: N17.9

## 2013-04-06 LAB — CMP14+EGFR
ALT: 7 IU/L (ref 0–44)
Albumin: 3.9 g/dL (ref 3.6–4.8)
BUN: 65 mg/dL — ABNORMAL HIGH (ref 8–27)
Calcium: 9.3 mg/dL (ref 8.6–10.2)
Chloride: 102 mmol/L (ref 97–108)
GFR calc Af Amer: 40 mL/min/{1.73_m2} — ABNORMAL LOW (ref >59)
GFR calc non Af Amer: 35 mL/min/{1.73_m2} — ABNORMAL LOW (ref >59)
Glucose: 165 mg/dL — ABNORMAL HIGH (ref 65–99)
Potassium: 7.2 mmol/L (ref 3.5–5.2)
Total Bilirubin: 0.2 mg/dL (ref 0.0–1.2)
Total Protein: 6.8 g/dL (ref 6.0–8.5)

## 2013-04-06 LAB — BMP8+EGFR
BUN/Creatinine Ratio: 31 — ABNORMAL HIGH (ref 10–22)
CO2: 8 mmol/L — CL (ref 18–29)
Creatinine, Ser: 2.16 mg/dL — ABNORMAL HIGH (ref 0.76–1.27)
Potassium: 6.7 mmol/L (ref 3.5–5.2)
Sodium: 127 mmol/L — ABNORMAL LOW (ref 134–144)

## 2013-04-06 LAB — BLOOD GAS, ARTERIAL
Bicarbonate: 6.7 mEq/L — ABNORMAL LOW (ref 20.0–24.0)
Drawn by: 22223
FIO2: 21 %
O2 Saturation: 99.3 %
pCO2 arterial: 20.1 mmHg — ABNORMAL LOW (ref 35.0–45.0)

## 2013-04-06 LAB — CBC WITH DIFFERENTIAL/PLATELET
Basophils Absolute: 0 10*3/uL (ref 0.0–0.1)
Basophils Relative: 0 % (ref 0–1)
Eosinophils Relative: 1 % (ref 0–5)
HCT: 30 % — ABNORMAL LOW (ref 39.0–52.0)
MCHC: 34.3 g/dL (ref 30.0–36.0)
MCV: 86.2 fL (ref 78.0–100.0)
Monocytes Absolute: 0.7 10*3/uL (ref 0.1–1.0)
Neutro Abs: 7.6 10*3/uL (ref 1.7–7.7)
Platelets: 189 10*3/uL (ref 150–400)
RDW: 15.8 % — ABNORMAL HIGH (ref 11.5–15.5)
WBC: 9.9 10*3/uL (ref 4.0–10.5)

## 2013-04-06 LAB — ANEMIA PROFILE B
Basophils Absolute: 0 10*3/uL (ref 0.0–0.2)
Basos: 0 %
Eosinophils Absolute: 0 10*3/uL (ref 0.0–0.4)
Ferritin: 208 ng/mL (ref 30–400)
Folate: 13.2 ng/mL (ref >3.0)
Hemoglobin: 11.1 g/dL — ABNORMAL LOW (ref 12.6–17.7)
Immature Grans (Abs): 0 10*3/uL (ref 0.0–0.1)
Iron Saturation: 18 % (ref 15–55)
Iron: 42 ug/dL (ref 40–155)
Lymphs: 20 %
MCH: 29.6 pg (ref 26.6–33.0)
MCHC: 34 g/dL (ref 31.5–35.7)
Monocytes Absolute: 0.8 10*3/uL (ref 0.1–0.9)
Neutrophils Relative %: 74 %
Platelets: 226 10*3/uL (ref 150–379)
RDW: 17.2 % — ABNORMAL HIGH (ref 12.3–15.4)
Retic Ct Pct: 2.1 % (ref 0.6–2.6)
UIBC: 188 ug/dL (ref 150–375)
Vitamin B-12: 325 pg/mL (ref 211–946)

## 2013-04-06 LAB — COMPREHENSIVE METABOLIC PANEL
ALT: 7 U/L (ref 0–53)
AST: 9 U/L (ref 0–37)
Albumin: 3.1 g/dL — ABNORMAL LOW (ref 3.5–5.2)
Alkaline Phosphatase: 82 U/L (ref 39–117)
Calcium: 8.9 mg/dL (ref 8.4–10.5)
Chloride: 103 mEq/L (ref 96–112)
Creatinine, Ser: 2.18 mg/dL — ABNORMAL HIGH (ref 0.50–1.35)
GFR calc Af Amer: 35 mL/min — ABNORMAL LOW (ref >90)
Sodium: 123 mEq/L — ABNORMAL LOW (ref 135–145)
Total Protein: 6.7 g/dL (ref 6.0–8.3)

## 2013-04-06 LAB — TROPONIN I: Troponin I: <0.3 ng/mL (ref <0.30)

## 2013-04-06 LAB — NMR, LIPOPROFILE
Cholesterol: 95 mg/dL (ref <200)
HDL Cholesterol by NMR: 37 mg/dL — ABNORMAL LOW (ref >=40)
LDL Particle Number: 348 nmol/L (ref <1000)
LDL Size: 20.9 nm (ref >20.5)
LDLC SERPL CALC-MCNC: 39 mg/dL (ref <100)
Triglycerides by NMR: 97 mg/dL (ref <150)

## 2013-04-06 LAB — URINALYSIS, ROUTINE W REFLEX MICROSCOPIC
Glucose, UA: NEGATIVE mg/dL
Ketones, ur: NEGATIVE mg/dL
Nitrite: NEGATIVE
Protein, ur: 100 mg/dL — AB
Specific Gravity, Urine: 1.015 (ref 1.005–1.030)
pH: 6.5 (ref 5.0–8.0)

## 2013-04-06 LAB — SALICYLATE LEVEL: Salicylate Lvl: <2 mg/dL — ABNORMAL LOW (ref 2.8–20.0)

## 2013-04-06 LAB — URINE MICROSCOPIC-ADD ON

## 2013-04-06 LAB — LACTIC ACID, PLASMA: Lactic Acid, Venous: 0.5 mmol/L (ref 0.5–2.2)

## 2013-04-06 MED ORDER — HEPARIN SOD (PORK) LOCK FLUSH 100 UNIT/ML IV SOLN
INTRAVENOUS | Status: AC
  Filled 2013-04-06: qty 5

## 2013-04-06 MED ORDER — SODIUM BICARBONATE 8.4 % IV SOLN
INTRAVENOUS | Status: AC
  Filled 2013-04-06: qty 150

## 2013-04-06 MED ORDER — METOPROLOL TARTRATE 25 MG PO TABS
25.0000 mg | ORAL_TABLET | Freq: Two times a day (BID) | ORAL | Status: DC
  Administered 2013-04-06 – 2013-04-09 (×6): 25 mg via ORAL
  Filled 2013-04-06 (×6): qty 1

## 2013-04-06 MED ORDER — ONDANSETRON HCL 4 MG/2ML IJ SOLN: 4.0000 mg | Freq: Four times a day (QID) | INTRAMUSCULAR | Status: DC | PRN

## 2013-04-06 MED ORDER — CALCIUM CHLORIDE 10 % IV SOLN
INTRAVENOUS | Status: AC
  Filled 2013-04-06: qty 10

## 2013-04-06 MED ORDER — HEPARIN SODIUM (PORCINE) 5000 UNIT/ML IJ SOLN
5000.0000 [IU] | Freq: Three times a day (TID) | INTRAMUSCULAR | Status: DC
  Administered 2013-04-06 – 2013-04-09 (×8): 5000 [IU] via SUBCUTANEOUS
  Filled 2013-04-06 (×8): qty 1

## 2013-04-06 MED ORDER — DEXTROSE 50 % IV SOLN
INTRAVENOUS | Status: AC
  Filled 2013-04-06: qty 50

## 2013-04-06 MED ORDER — HYDROCODONE-ACETAMINOPHEN 5-325 MG PO TABS
1.0000 | ORAL_TABLET | Freq: Four times a day (QID) | ORAL | Status: DC | PRN
  Administered 2013-04-08: 1 via ORAL
  Filled 2013-04-06: qty 1

## 2013-04-06 MED ORDER — INSULIN ASPART 100 UNIT/ML ~~LOC~~ SOLN
10.0000 [IU] | Freq: Once | SUBCUTANEOUS | Status: AC
  Administered 2013-04-06: 10 [IU] via SUBCUTANEOUS

## 2013-04-06 MED ORDER — SODIUM CHLORIDE 0.45 % IV SOLN: INTRAVENOUS | Status: DC

## 2013-04-06 MED ORDER — SODIUM CHLORIDE 0.9 % IV SOLN: 250.0000 mL | INTRAVENOUS | Status: DC | PRN

## 2013-04-06 MED ORDER — SODIUM CHLORIDE 0.9 % IV BOLUS (SEPSIS)
1000.0000 mL | Freq: Once | INTRAVENOUS | Status: AC
  Administered 2013-04-06: 1000 mL via INTRAVENOUS

## 2013-04-06 MED ORDER — SODIUM CHLORIDE 0.9 % IJ SOLN
3.0000 mL | INTRAMUSCULAR | Status: DC | PRN
  Administered 2013-04-07: 3 mL via INTRAVENOUS

## 2013-04-06 MED ORDER — SODIUM BICARBONATE 8.4 % IV SOLN
50.0000 meq | Freq: Once | INTRAVENOUS | Status: AC
  Administered 2013-04-06: 50 meq via INTRAVENOUS
  Filled 2013-04-06: qty 50

## 2013-04-06 MED ORDER — INSULIN ASPART 100 UNIT/ML ~~LOC~~ SOLN
0.0000 [IU] | Freq: Three times a day (TID) | SUBCUTANEOUS | Status: DC
  Administered 2013-04-07 (×2): 2 [IU] via SUBCUTANEOUS
  Administered 2013-04-07: 1 [IU] via SUBCUTANEOUS
  Administered 2013-04-08 (×2): 2 [IU] via SUBCUTANEOUS

## 2013-04-06 MED ORDER — CALCIUM GLUCONATE 10 % IV SOLN
INTRAVENOUS | Status: AC
  Filled 2013-04-06: qty 10

## 2013-04-06 MED ORDER — ONDANSETRON HCL 4 MG/2ML IJ SOLN
4.0000 mg | Freq: Once | INTRAMUSCULAR | Status: AC
  Administered 2013-04-06: 4 mg via INTRAVENOUS
  Filled 2013-04-06: qty 2

## 2013-04-06 MED ORDER — CITALOPRAM HYDROBROMIDE 20 MG PO TABS
20.0000 mg | ORAL_TABLET | Freq: Every day | ORAL | Status: DC
  Administered 2013-04-07 – 2013-04-09 (×3): 20 mg via ORAL
  Filled 2013-04-06 (×3): qty 1

## 2013-04-06 MED ORDER — ISOSORBIDE MONONITRATE ER 60 MG PO TB24
30.0000 mg | ORAL_TABLET | Freq: Every day | ORAL | Status: DC
  Administered 2013-04-07 – 2013-04-09 (×3): 30 mg via ORAL
  Filled 2013-04-06 (×3): qty 1

## 2013-04-06 MED ORDER — ALPRAZOLAM 1 MG PO TABS
1.0000 mg | ORAL_TABLET | Freq: Every evening | ORAL | Status: DC | PRN
  Administered 2013-04-06 – 2013-04-08 (×3): 1 mg via ORAL
  Filled 2013-04-06 (×3): qty 1

## 2013-04-06 MED ORDER — SODIUM POLYSTYRENE SULFONATE 15 GM/60ML PO SUSP
45.0000 g | Freq: Once | ORAL | Status: AC
  Administered 2013-04-06: 45 g via ORAL
  Filled 2013-04-06: qty 180

## 2013-04-06 MED ORDER — AMLODIPINE BESYLATE 5 MG PO TABS
5.0000 mg | ORAL_TABLET | Freq: Every day | ORAL | Status: DC
  Administered 2013-04-07 – 2013-04-09 (×3): 5 mg via ORAL
  Filled 2013-04-06 (×3): qty 1

## 2013-04-06 MED ORDER — SODIUM CHLORIDE 0.9 % IJ SOLN
3.0000 mL | Freq: Two times a day (BID) | INTRAMUSCULAR | Status: DC
  Administered 2013-04-07 – 2013-04-08 (×4): 3 mL via INTRAVENOUS

## 2013-04-06 MED ORDER — DEXTROSE 50 % IV SOLN
50.0000 mL | Freq: Once | INTRAVENOUS | Status: AC
Start: 2013-04-06 — End: 2013-04-06
  Administered 2013-04-06: 50 mL via INTRAVENOUS

## 2013-04-06 MED ORDER — ATORVASTATIN CALCIUM 10 MG PO TABS
10.0000 mg | ORAL_TABLET | Freq: Every day | ORAL | Status: DC
  Administered 2013-04-06 – 2013-04-08 (×3): 10 mg via ORAL
  Filled 2013-04-06 (×3): qty 1

## 2013-04-06 MED ORDER — ASPIRIN EC 81 MG PO TBEC
81.0000 mg | DELAYED_RELEASE_TABLET | Freq: Every day | ORAL | Status: DC
  Administered 2013-04-06 – 2013-04-08 (×3): 81 mg via ORAL
  Filled 2013-04-06 (×3): qty 1

## 2013-04-06 MED ORDER — CALCIUM GLUCONATE 10 % IV SOLN
1.0000 g | Freq: Once | INTRAVENOUS | Status: DC
  Filled 2013-04-06: qty 10

## 2013-04-06 MED ORDER — INSULIN ASPART 100 UNIT/ML ~~LOC~~ SOLN
SUBCUTANEOUS | Status: AC
  Filled 2013-04-06: qty 1

## 2013-04-06 MED ORDER — ACETAMINOPHEN 325 MG PO TABS: 650.0000 mg | ORAL_TABLET | Freq: Four times a day (QID) | ORAL | Status: DC | PRN

## 2013-04-06 MED ORDER — SODIUM CHLORIDE 0.9 % IV BOLUS (SEPSIS): 1000.0000 mL | Freq: Once | INTRAVENOUS | Status: DC

## 2013-04-06 MED ORDER — ACETAMINOPHEN 650 MG RE SUPP: 650.0000 mg | Freq: Four times a day (QID) | RECTAL | Status: DC | PRN

## 2013-04-06 MED ORDER — SODIUM CHLORIDE 0.9 % IV SOLN
1.0000 g | Freq: Once | INTRAVENOUS | Status: AC
  Administered 2013-04-06: 1 g via INTRAVENOUS

## 2013-04-06 MED ORDER — ONDANSETRON HCL 4 MG PO TABS: 4.0000 mg | ORAL_TABLET | Freq: Four times a day (QID) | ORAL | Status: DC | PRN

## 2013-04-06 MED ORDER — SODIUM BICARBONATE 8.4 % IV SOLN
INTRAVENOUS | Status: DC
  Administered 2013-04-06: 21:00:00 via INTRAVENOUS
  Filled 2013-04-06 (×9): qty 1000

## 2013-04-06 NOTE — ED Notes (Signed)
Sue Lush, pharmacist at [facility] [place] called to notify pt's sodium bicarbonate drip needs to be mixed in 1000 ml D5 or Sterile water . Dr Manus Gunning and Dr Sharl Ma notified. Verbal order obtained from both said Drs for D5 mixture.

## 2013-04-06 NOTE — Progress Notes (Signed)
Pt came in for labs only 

## 2013-04-06 NOTE — ED Notes (Signed)
Urostomy emptied. Urine clear, no odor. Site remains unchanged. Bag intact

## 2013-04-06 NOTE — ED Notes (Signed)
Pt reports nausea, vomiting, diarrhea and fever since Wednesday, also has temporary pacemaker in place and it has come loose.  Had lab work at [employer] office and told potassium level 6.7.

## 2013-04-06 NOTE — ED Notes (Signed)
CRITICAL VALUE ALERT  Critical value received: K+ 6.6; CO2 8  Date of notification:  04/06/13  Time of notification:  1848  Critical value read back: yes  Nurse who received alert:  A. Dareen Piano, RN  MD notified (1st page):  [place]  Time of first page:  [phone]

## 2013-04-06 NOTE — Telephone Encounter (Signed)
Critically potassium value of 6.7 and chloride 8 called from [facility]. Patient advised to go to [facility].  He agreed to this. Lab results aren't in EMR yet but we are contacting [facility] so that they will enter them before he gets to the hospital.

## 2013-04-06 NOTE — ED Notes (Signed)
Pt presents with c/o hyperkalemia, per [facility], and N/V/D. Pt reports has a uriostomy placed a month prior due to prostate Ca surgery and external pace maker implanted at [facility]. Pt states external pace maker was placed secondary to bradycardia to sustain prostate surgery. Pt is alert and active, oriented x 4. No emesis noted at this time. Pt also has healing incision to mid abdomin with no s/s of infection.

## 2013-04-06 NOTE — Telephone Encounter (Signed)
New Problem  Per wife-Pt was at home his temporary pacemaker "fell out" and was admitted to [facility] for high potassium// Pt is afraid that the pacemaker will get infected. Please call back to discuss.

## 2013-04-06 NOTE — ED Provider Notes (Signed)
And CSN: [id number]     Arrival date & time 04/06/13  1614 History  This chart was scribed for Glynn Octave, MD by Bennett Scrape, ED Scribe. This patient was seen in room APA12/APA12 and the patient's care was started at 4:45 PM.   Chief Complaint  Patient presents with  . Nausea  . Emesis  . Diarrhea  . Pacemaker Problem    The history is provided by the patient. No language interpreter was used.    HPI Comments: Frederick Foster is a 62 y.o. male who presents to the Emergency Department complaining of persistent, daily emesis with associated dizziness, nausea and decreased appetite that started one week ago. He also reports intermittent diarrhea and intermittent subjective fevers. Pt denies having any sick contacts with similar symptoms. He was evaluated yesterday and today for the same at his PCP's office. He states that he was told her had a potassium of 7.5 yesterday and 6.7 today and was told to come to the ED for evaluation for hyperkalemia. He has a h/o hyperkalemia and CKD with a nephrectomy, but he denies that his kidney problems are from this prior history. He also had a cystectomy and prostatectomy one month ago done by an Insurance underwriter at [facility] due to bladder and prostate CA and now urinates only out of a foley. He denies urination out of his penis. He has been packing his surgical wounds at home with no change in pain or signs of infection. 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 He will have a permanent defibrillator placed once his abdominal surgery heals. He expresses concern that the pacemaker is out of alignment due to the "covering" having come off during the night. He denies that the pacemaker has come out of his body, just that it feels lower down in the chest cavity. Last admission was last month for his surgery. He denies increased abdominal pain or CP.  PCP is Dr. Bennie Pierini.   Past Medical History  Diagnosis Date  . Mixed  hyperlipidemia   . 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 HERNIA, VENTRAL   . CAROTID BRUIT   . Coronary artery disease     h/o MI and bypass surgery x 3 in 2009 complicated by sternal wound infection, sees Dr. Eden Emms  . Vein symptom     injury to left leg due to conveyor belt.  caused injury to veins in left leg/ s/p surgery   . Sinus congestion     has allergies  . AODM   . CKD (chronic kidney disease), stage III     has one kidney.  had left kidney removed in 2011 for cancer  . Hiatal hernia     s/p hiatal hernia surgery 2010  . Anxiety     history of panic attacks  . Depression     takes celexa  . Atrial flutter 11/19/2011  . Atrial fibrillation   . Cancer      kidney ca, also mass to left neck  . Lymphoma   . Bladder cancer   . Prostate cancer   . Non Hodgkin's lymphoma    Past Surgical History  Procedure Laterality Date  . Cholecystectomy    . Kidney surgery    . Hernia repair      2010  . Coronary artery bypass graft      x3 .  Dr. Ellyn Hack is  present cardiologist  . Vein surgery      s/p injury to left leg from conveyor belt  . Cardiac catheterization      2009  . Portacath placement    . Tee without cardioversion  01/07/2012    Procedure: TRANSESOPHAGEAL ECHOCARDIOGRAM (TEE);  Surgeon: Pricilla Riffle, MD;  Location: [facility] ENDOSCOPY;  Service: Cardiovascular;  Laterality: N/A;  . Atrial flutter ablation  01/07/12    CTI ablation by Dr Johney Frame  . Bladder surgery    . Prostate surgery    . Temporary pacemaker insertion     No family history on file. History  Substance Use Topics  . Smoking status: Former Smoker -- 1.00 packs/day for 40 years    Types: Cigarettes    Quit date: 11/22/2009  . Smokeless tobacco: Not on file  . Alcohol Use: No     Comment: has not drank since 1991    Review of Systems  A complete 10 system review of systems was obtained and all systems are negative except as noted in  the HPI and PMH.   Allergies  Penicillins and Sulfonamide derivatives  Home Medications   Current Outpatient Rx  Name  Route  Sig  Dispense  Refill  . ALPRAZolam (XANAX) 1 MG tablet   Oral   Take 1 mg by mouth at bedtime as needed for sleep or anxiety.         Marland Kitchen amLODipine (NORVASC) 5 MG tablet   Oral   Take 5 mg by mouth daily.         Marland Kitchen aspirin EC 81 MG tablet   Oral   Take 81 mg by mouth at bedtime.          . citalopram (CELEXA) 20 MG tablet   Oral   Take 1 tablet (20 mg total) by mouth daily.   30 tablet   2   . furosemide (LASIX) 20 MG tablet   Oral   Take 1 tablet (20 mg total) by mouth daily.   30 tablet   5   . glimepiride (AMARYL) 4 MG tablet   Oral   Take 1 tablet (4 mg total) by mouth daily before breakfast.   30 tablet   2   . HYDROcodone-acetaminophen (NORCO/VICODIN) 5-325 MG per tablet   Oral   Take 1 tablet by mouth every 6 (six) hours as needed. For pain         . lisinopril (PRINIVIL,ZESTRIL) 40 MG tablet   Oral   Take 40 mg by mouth daily.         Marland Kitchen nystatin-triamcinolone (MYCOLOG II) cream   Topical   Apply 1 application topically daily. To apply when changing dressing         . rosuvastatin (CRESTOR) 5 MG tablet   Oral   Take 5 mg by mouth at bedtime.          . isosorbide mononitrate (IMDUR) 30 MG 24 hr tablet   Oral   Take 30 mg by mouth daily.         . metoprolol tartrate (LOPRESSOR) 25 MG tablet   Oral   Take 25 mg by mouth 2 (two) times daily.           Triage Vitals: BP 110/44  Pulse 60  Temp(Src) 97.6 F (36.4 C) (Oral)  Resp 20  Ht 6' (1.829 m)  Wt 239 lb (108.41 kg)  BMI 32.41 kg/m2  SpO2 99%  Physical Exam  Nursing note and  vitals reviewed. Constitutional: He is oriented to person, place, and time. He appears well-developed and well-nourished. No distress.  Appears pale and chronically ill  HENT:  Head: Normocephalic and atraumatic.  Eyes: Conjunctivae and EOM are normal.  Neck: Normal  range of motion. Neck supple. No tracheal deviation present.  Cardiovascular: Normal rate, regular rhythm and normal heart sounds.   No murmur heard. Pulmonary/Chest: Effort normal and breath sounds normal. No respiratory distress. He has no wheezes. He has no rales.  Pacemarker wire with intact sutures in the mid left clavicle. No drainage or bleeding. Sutures appear secure.  Abdominal: Soft. Bowel sounds are normal. There is no tenderness.  Midline abdominal surgical site with 2 open wounds with pink granulation tissue and drainage. He has a right side urostomy with clear urine.   Genitourinary:  No testicular tenderness  Musculoskeletal: Normal range of motion. He exhibits no edema.  Neurological: He is alert and oriented to person, place, and time. No cranial nerve deficit.  Skin: Skin is warm and dry.  Psychiatric: He has a normal mood and affect. His behavior is normal.    ED Course  Procedures (including critical care time)  Medications  heparin lock flush 100 UNIT/ML injection (  Not Given 04/06/13 2059)  dextrose 5 % 1,000 mL with sodium bicarbonate 150 mEq infusion ( Intravenous New Bag/Given 04/06/13 2047)  sodium chloride 0.9 % bolus 1,000 mL (0 mLs Intravenous Stopped 04/06/13 1925)  ondansetron (ZOFRAN) injection 4 mg (4 mg Intravenous Given 04/06/13 1725)  sodium polystyrene (KAYEXALATE) 15 GM/60ML suspension 45 g (45 g Oral Given 04/06/13 2005)  insulin aspart (novoLOG) injection 10 Units (10 Units Subcutaneous Given 04/06/13 2020)  dextrose 50 % solution 50 mL (50 mLs Intravenous Given 04/06/13 2021)  sodium bicarbonate injection 50 mEq (50 mEq Intravenous Given 04/06/13 1938)  calcium gluconate 1 g in sodium chloride 0.9 % 100 mL IVPB (1 g Intravenous New Bag/Given 04/06/13 2044)    DIAGNOSTIC STUDIES: Oxygen Saturation is 99% on room air, normal by my interpretation.    COORDINATION OF CARE: 5:00 PM-Discussed treatment plan which includes medications, CXR, CBC panel, CMP  and UA with pt at bedside and pt agreed to plan.   7:01 PM-Pt rechecked and feels improved with medications listed above. Informed pt of hyperkalemia noted upon lab work performed in the ED today and need for admission. Pt is agreeable to admission for treatment of hyperkalemia.   7:17 PM-Consult complete with Dr. Sharl Ma, hospitalist. Patient case explained and discussed. Dr. Sharl Ma will follow up with Nephrology to determine further treatment plan. Call ended at 7:19 PM.  Labs Review Labs Reviewed  CBC WITH DIFFERENTIAL - Abnormal; Notable for the following:    RBC 3.48 (*)    Hemoglobin 10.3 (*)    HCT 30.0 (*)    RDW 15.8 (*)    All other components within normal limits  COMPREHENSIVE METABOLIC PANEL - Abnormal; Notable for the following:    Sodium 123 (*)    Potassium 6.6 (*)    CO2 8 (*)    Glucose, Bld 166 (*)    BUN 68 (*)    Creatinine, Ser 2.18 (*)    Albumin 3.1 (*)    GFR calc non Af Amer 30 (*)    GFR calc Af Amer 35 (*)    All other components within normal limits  URINALYSIS, ROUTINE W REFLEX MICROSCOPIC - Abnormal; Notable for the following:    APPearance CLOUDY (*)    Hgb urine  dipstick MODERATE (*)    Bilirubin Urine SMALL (*)    Protein, ur 100 (*)    Leukocytes, UA SMALL (*)    All other components within normal limits  BLOOD GAS, ARTERIAL - Abnormal; Notable for the following:    pH, Arterial 7.147 (*)    pCO2 arterial 20.1 (*)    pO2, Arterial 116.0 (*)    Bicarbonate 6.7 (*)    Acid-Base Excess 20.7 (*)    Allens test (pass/fail) NOT INDICATED (*)    All other components within normal limits  URINE MICROSCOPIC-ADD ON - Abnormal; Notable for the following:    Bacteria, UA MANY (*)    Casts GRANULAR CAST (*)    All other components within normal limits  URINE CULTURE  LIPASE, BLOOD  TROPONIN I  LACTIC ACID, PLASMA  SALICYLATE LEVEL  ACETAMINOPHEN LEVEL   Imaging Review Dg Chest 2 View  04/06/2013   CLINICAL DATA:  Nausea. Dizziness. Unspecified  problems with the indwelling pacemaker. Prior CABG. Current history of non-Hodgkin's lymphoma.  EXAM: CHEST  2 VIEW  COMPARISON:  One view chest x-ray 03/31/2013. Two-view chest x-ray 04/25/2012. PET-CT 08/08/2012.  FINDINGS: Prior sternotomy for CABG. Cardiac silhouette normal in size, unchanged. Thoracic aorta atherosclerotic, unchanged. Hilar and mediastinal contours otherwise unremarkable. Lungs clear. Bronchovascular markings normal. Pulmonary vascularity normal. No visible pleural effusions. No pneumothorax. Degenerative changes involving the lower thoracic spine. Right jugular Port-A-Cath tip in the lower SVC. Left subclavian single lead transvenous pacemaker with the lead tip at the expected location of the RV apex, unchanged.  IMPRESSION: Support apparatus satisfactory. No acute cardiopulmonary disease.   Electronically Signed   By: Hulan Saas   On: 04/06/2013 18:33    MDM   1. Hyperkalemia   2. Metabolic acidosis   3. ARF (acute renal failure)   4. Bladder cancer   5. CHB (complete heart block)   6. CKD (chronic kidney disease), stage III   7. DM (diabetes mellitus), type 2, uncontrolled   8. HTN (hypertension)   9. Hyponatremia    One week of nausea, vomiting, diarrhea and subjective fever. Had lab work and was told his potassium level is elevated. Endorses dizziness lightheadedness with standing. History of radical cystectomy with ileal conduit anastomosis one month ago.  Hyperkalemia of 6.6. Nonanion gap acidosis with bicarb 8 and pH 7.1 Patient is given insulin, dextrose, Kayexalate, calcium, bicarbonate.  Abdominal exam is benign. Pacemaker in place and functioning. He has acute renal failure and dehydration secondary to poor by mouth intake. His presentation was discussed with on-call nephrologist Dr. Fausto Skillern feels his acidosis is likely secondary to his kidney disease and recent surgery. No indication for emergent dialysis.  We'll admit for treatment of hyperkalemia  and acidosis with bicarbonate drip.   Date: 04/06/2013  Rate: 60  Rhythm: paced  QRS Axis: normal  Intervals: normal  ST/T Wave abnormalities: normal  Conduction Disutrbances:none  Narrative Interpretation: now paced  Old EKG Reviewed: changes noted  CRITICAL CARE Performed by: Glynn Octave Total critical care time: 40 Critical care time was exclusive of separately billable procedures and treating other patients. Critical care was necessary to treat or prevent imminent or life-threatening deterioration. Critical care was time spent personally by me on the following activities: development of treatment plan with patient and/or surrogate as well as nursing, discussions with consultants, evaluation of patient's response to treatment, examination of patient, obtaining history from patient or surrogate, ordering and performing treatments and interventions, ordering and review of laboratory  studies, ordering and review of radiographic studies, pulse oximetry and re-evaluation of patient's condition.    I personally performed the services described in this documentation, which was scribed in my presence. The recorded information has been reviewed and is accurate.       Glynn Octave, MD 04/06/13 [phone]

## 2013-04-06 NOTE — H&P (Signed)
PCP:   Rudi Heap, MD   Chief Complaint:  Generalized weakness  HPI: 62 year old male with history of multiple medical problems including bladder carcinoma status post cystectomy, prostatitis or status post prostatectomy 1 month ago done at [facility]. Patient has history of multiple cancers including renal cell carcinoma status post nephrectomy, lymphoma. As per patient he has had decreased appetite with nausea but started week ago. Patient says that he is unable to keep anything down and would only eats he vomits. Patient was seen at the PCP office yesterday he was told that her potassium was 7.5 and today he came to recheck the potassium and 86.7 in the ED. Patient does have a history of hyperkalemia C. KD and nephrectomy. Patient has a history of complete heart block and has temporary pacemaker placed at [facility]. Patient is supposed to get permanent defibrillator once the abdominal scar heals. Patient denies any symptoms of chest pain no shortness of breath he admits to having headache, gait problems with one episode of fall a few days ago. He also admits to having some blurred vision denies any fever. In the ED patient was also found to have non-gap metabolic acidosis with CO2 of 8 and sodium is 123.  Allergies:   Allergies  Allergen Reactions  . Penicillins Swelling  . Sulfonamide Derivatives Rash      Past Medical History  Diagnosis Date  . Mixed hyperlipidemia   . 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 HERNIA, VENTRAL   . CAROTID BRUIT   . Coronary artery disease     h/o MI and bypass surgery x 3 in 2009 complicated by sternal wound infection, sees Dr. Eden Emms  . Vein symptom     injury to left leg due to conveyor belt.  caused injury to veins in left leg/ s/p surgery   . Sinus congestion     has allergies  . AODM   . CKD (chronic kidney disease), stage III     has one kidney.  had left kidney removed in 2011 for  cancer  . Hiatal hernia     s/p hiatal hernia surgery 2010  . Anxiety     history of panic attacks  . Depression     takes celexa  . Atrial flutter 11/19/2011  . Atrial fibrillation   . Cancer      kidney ca, also mass to left neck  . Lymphoma   . Bladder cancer   . Prostate cancer   . Non Hodgkin's lymphoma     Past Surgical History  Procedure Laterality Date  . Cholecystectomy    . Kidney surgery    . Hernia repair      2010  . Coronary artery bypass graft      x3 .  Dr. Ellyn Hack is present cardiologist  . Vein surgery      s/p injury to left leg from conveyor belt  . Cardiac catheterization      2009  . Portacath placement    . Tee without cardioversion  01/07/2012    Procedure: TRANSESOPHAGEAL ECHOCARDIOGRAM (TEE);  Surgeon: Pricilla Riffle, MD;  Location: [facility] ENDOSCOPY;  Service: Cardiovascular;  Laterality: N/A;  . Atrial flutter ablation  01/07/12    CTI ablation by Dr Johney Frame  . Bladder surgery    . Prostate surgery    . Temporary pacemaker insertion      Prior to Admission medications   Medication  Sig Start Date End Date Taking? Authorizing Provider  ALPRAZolam Prudy Feeler) 1 MG tablet Take 1 mg by mouth at bedtime as needed for sleep or anxiety.   Yes Historical Provider, MD  amLODipine (NORVASC) 5 MG tablet Take 5 mg by mouth daily. 11/13/12  Yes Wendall Stade, MD  aspirin EC 81 MG tablet Take 81 mg by mouth at bedtime.    Yes Historical Provider, MD  citalopram (CELEXA) 20 MG tablet Take 1 tablet (20 mg total) by mouth daily. 03/17/13  Yes Mary-Margaret Daphine Deutscher, FNP  furosemide (LASIX) 20 MG tablet Take 1 tablet (20 mg total) by mouth daily. 02/23/13  Yes Mary-Margaret Daphine Deutscher, FNP  glimepiride (AMARYL) 4 MG tablet Take 1 tablet (4 mg total) by mouth daily before breakfast. 12/05/12  Yes Mary-Margaret Daphine Deutscher, FNP  HYDROcodone-acetaminophen (NORCO/VICODIN) 5-325 MG per tablet Take 1 tablet by mouth every 6 (six) hours as needed. For pain   Yes Historical Provider, MD  lisinopril  (PRINIVIL,ZESTRIL) 40 MG tablet Take 40 mg by mouth daily.   Yes Historical Provider, MD  nystatin-triamcinolone (MYCOLOG II) cream Apply 1 application topically daily. To apply when changing dressing 03/26/13  Yes Historical Provider, MD  rosuvastatin (CRESTOR) 5 MG tablet Take 5 mg by mouth at bedtime.    Yes Historical Provider, MD  isosorbide mononitrate (IMDUR) 30 MG 24 hr tablet Take 30 mg by mouth daily.    Historical Provider, MD  metoprolol tartrate (LOPRESSOR) 25 MG tablet Take 25 mg by mouth 2 (two) times daily.  03/17/13   Historical Provider, MD    Social History:  reports that he quit smoking about 3 years ago. His smoking use included Cigarettes. He has a 40 pack-year smoking history. He does not have any smokeless tobacco history on file. He reports that he uses illicit drugs (Marijuana). He reports that he does not drink alcohol. And  All the positives are listed in BOLD  Review of Systems:  HEENT: Headache, blurred vision, runny nose, sore throat Neck: Hypothyroidism, hyperthyroidism,,lymphadenopathy Chest : Shortness of breath, history of COPD, Asthma Heart : Chest pain, history of coronary arterey disease GI:  Nausea, vomiting, diarrhea, constipation, GERD GU: Dysuria, urgency, frequency of urination, hematuria Neuro: Stroke, seizures, syncope Psych: Depression, anxiety, hallucinations   Physical Exam: Blood pressure 149/50, pulse 61, temperature 97.6 F (36.4 C), temperature source Oral, resp. rate 20, height 6' (1.829 m), weight 108.41 kg (239 lb), SpO2 100.00%. Constitutional:   Patient is a well-developed and well-nourished male in no acute distress and cooperative with exam. Head: Normocephalic and atraumatic Mouth: Mucus membranes moist Eyes: PERRL, EOMI, conjunctivae normal Neck: Supple, No Thyromegaly Cardiovascular: RRR, S1 normal, S2 normal Pulmonary/Chest: CTAB, no wheezes, rales, or rhonchi Abdominal: Soft. Non-tender, ileostomy bag in place, dressing in  the suprapubic region bowel sounds are normal, no masses, organomegaly, or guarding present.  Neurological: A&O x3, Strenght is normal and symmetric bilaterally, cranial nerve II-XII are grossly intact, no focal motor deficit, sensory intact to light touch bilaterally.  Extremities : No Cyanosis, Clubbing or Edema   Labs on Admission:  Results for orders placed during the hospital encounter of 04/06/13 (from the past 48 hour(s))  CBC WITH DIFFERENTIAL     Status: Abnormal   Collection Time    04/06/13  5:57 PM      Result Value Range   WBC 9.9  4.0 - 10.5 K/uL   RBC 3.48 (*) 4.22 - 5.81 MIL/uL   Hemoglobin 10.3 (*) 13.0 - 17.0 g/dL  HCT 30.0 (*) 39.0 - 52.0 %   MCV 86.2  78.0 - 100.0 fL   MCH 29.6  26.0 - 34.0 pg   MCHC 34.3  30.0 - 36.0 g/dL   RDW 65.7 (*) 84.6 - 96.2 %   Platelets 189  150 - 400 K/uL   Neutrophils Relative % 76  43 - 77 %   Neutro Abs 7.6  1.7 - 7.7 K/uL   Lymphocytes Relative 15  12 - 46 %   Lymphs Abs 1.5  0.7 - 4.0 K/uL   Monocytes Relative 7  3 - 12 %   Monocytes Absolute 0.7  0.1 - 1.0 K/uL   Eosinophils Relative 1  0 - 5 %   Eosinophils Absolute 0.1  0.0 - 0.7 K/uL   Basophils Relative 0  0 - 1 %   Basophils Absolute 0.0  0.0 - 0.1 K/uL  COMPREHENSIVE METABOLIC PANEL     Status: Abnormal   Collection Time    04/06/13  5:57 PM      Result Value Range   Sodium 123 (*) 135 - 145 mEq/L   Potassium 6.6 (*) 3.5 - 5.1 mEq/L   Comment: CRITICAL RESULT CALLED TO, READ BACK BY AND VERIFIED WITH:     A. ANDERSON AT 9528 ON 04/06/13 BY Wynonia Lawman   Chloride 103  96 - 112 mEq/L   CO2 8 (*) 19 - 32 mEq/L   Comment: CRITICAL RESULT CALLED TO, READ BACK BY AND VERIFIED WITH:     A. ANDERSON AT 1848 ON 04/06/13 BY S. VANHOORNE   Glucose, Bld 166 (*) 70 - 99 mg/dL   BUN 68 (*) 6 - 23 mg/dL   Creatinine, Ser 4.13 (*) 0.50 - 1.35 mg/dL   Calcium 8.9  8.4 - 24.4 mg/dL   Total Protein 6.7  6.0 - 8.3 g/dL   Albumin 3.1 (*) 3.5 - 5.2 g/dL   AST 9  0 - 37 U/L   ALT 7   0 - 53 U/L   Alkaline Phosphatase 82  39 - 117 U/L   Total Bilirubin 0.3  0.3 - 1.2 mg/dL   GFR calc non Af Amer 30 (*) >90 mL/min   GFR calc Af Amer 35 (*) >90 mL/min   Comment: (NOTE)     The eGFR has been calculated using the CKD EPI equation.     This calculation has not been validated in all clinical situations.     eGFR's persistently <90 mL/min signify possible Chronic Kidney     Disease.  LIPASE, BLOOD     Status: None   Collection Time    04/06/13  5:57 PM      Result Value Range   Lipase 22  11 - 59 U/L  TROPONIN I     Status: None   Collection Time    04/06/13  5:57 PM      Result Value Range   Troponin I <0.30  <0.30 ng/mL   Comment:            Due to the release kinetics of cTnI,     a negative result within the first hours     of the onset of symptoms does not rule out     myocardial infarction with certainty.     If myocardial infarction is still suspected,     repeat the test at appropriate intervals.  LACTIC ACID, PLASMA     Status: None   Collection Time    04/06/13  5:57 PM      Result Value Range   Lactic Acid, Venous 0.5  0.5 - 2.2 mmol/L  URINALYSIS, ROUTINE W REFLEX MICROSCOPIC     Status: Abnormal   Collection Time    04/06/13  6:47 PM      Result Value Range   Color, Urine YELLOW  YELLOW   APPearance CLOUDY (*) CLEAR   Specific Gravity, Urine 1.015  1.005 - 1.030   pH 6.5  5.0 - 8.0   Glucose, UA NEGATIVE  NEGATIVE mg/dL   Hgb urine dipstick MODERATE (*) NEGATIVE   Bilirubin Urine SMALL (*) NEGATIVE   Ketones, ur NEGATIVE  NEGATIVE mg/dL   Protein, ur 161 (*) NEGATIVE mg/dL   Urobilinogen, UA 0.2  0.0 - 1.0 mg/dL   Nitrite NEGATIVE  NEGATIVE   Leukocytes, UA SMALL (*) NEGATIVE  URINE MICROSCOPIC-ADD ON     Status: Abnormal   Collection Time    04/06/13  6:47 PM      Result Value Range   WBC, UA 21-50  <3 WBC/hpf   Bacteria, UA MANY (*) RARE   Casts GRANULAR CAST (*) NEGATIVE   Comment: HYALINE CASTS  BLOOD GAS, ARTERIAL     Status:  Abnormal   Collection Time    04/06/13  7:10 PM      Result Value Range   FIO2 21.00     Delivery systems ROOM AIR     pH, Arterial 7.147 (*) 7.350 - 7.450   Comment: CRITICAL RESULT CALLED TO, READ BACK BY AND VERIFIED WITH:     BRENDA NORMAN,RN BY K KNICK RRT RCP ON 04/06/2013 AT 1920   pCO2 arterial 20.1 (*) 35.0 - 45.0 mmHg   pO2, Arterial 116.0 (*) 80.0 - 100.0 mmHg   Bicarbonate 6.7 (*) 20.0 - 24.0 mEq/L   TCO2 6.6  0 - 100 mmol/L   Acid-Base Excess 20.7 (*) 0.0 - 2.0 mmol/L   O2 Saturation 99.3     Collection site RIGHT BRACHIAL     Drawn by [id number]     Sample type ARTERIAL     Allens test (pass/fail) NOT INDICATED (*) PASS    Radiological Exams on Admission: Dg Chest 2 View  04/06/2013   CLINICAL DATA:  Nausea. Dizziness. Unspecified problems with the indwelling pacemaker. Prior CABG. Current history of non-Hodgkin's lymphoma.  EXAM: CHEST  2 VIEW  COMPARISON:  One view chest x-ray 03/31/2013. Two-view chest x-ray 04/25/2012. PET-CT 08/08/2012.  FINDINGS: Prior sternotomy for CABG. Cardiac silhouette normal in size, unchanged. Thoracic aorta atherosclerotic, unchanged. Hilar and mediastinal contours otherwise unremarkable. Lungs clear. Bronchovascular markings normal. Pulmonary vascularity normal. No visible pleural effusions. No pneumothorax. Degenerative changes involving the lower thoracic spine. Right jugular Port-A-Cath tip in the lower SVC. Left subclavian single lead transvenous pacemaker with the lead tip at the expected location of the RV apex, unchanged.  IMPRESSION: Support apparatus satisfactory. No acute cardiopulmonary disease.   Electronically Signed   By: Hulan Saas   On: 04/06/2013 18:33    Assessment/Plan Active Problems:   HTN (hypertension)   Solitary kidney   ARF (acute renal failure)   DM (diabetes mellitus), type 2, uncontrolled   CHB (complete heart block)   Depression with anxiety  metabolic acidosis  Metabolic acidosis, hyperkalemia,  hyponatremia Patient has metabolic acidosis with other electrolyte abnormalities. Most likely  due acute renal failure and  the ileal conduit after the cystectomy. The cause for metabolic acidosis possible non-anion gap metabolic acidosis. Called and discussed with  Dr. Kristian Covey, he recommends to start patient on  bicarbonate drip. We'll also obtain urine sodium, chloride and potassium. Patient has already been given dextrose and insulin in the ED. Also received Kayexalate 40 g. We'll repeat checks it after 4 hours. Hyponatremia is also due to dehydration, we'll continue on bicarbonate and check sodium in the morning   Acute kidney injury  Secondary to dehydration and poor by mouth intake. We'll start him on IV fluids and monitor the renal functions  We'll hold Lasix at this time  Complete heart block  Status post temporary pacemaker  Stable at this time. We'll monitor him on telemetry   Hypertension  Continue metoprolol, will hold the lisinopril at this time  Abnormal UA  Could be secondary to ileal conduit  We'll obtain urine culture  Patient is afebrile with no leukocytosis  Will hold the antibiotics at this time   Diabetes mellitus Will hold the oral hypoglycemics  Start sliding scale insulin  DVT prophylaxis  Heparin   Code status:full code   Family discussion: no family at the bedside    Time Spent on Admission: 65 min  [facility] Pager: [phone] 04/06/2013, 7:57 PM  If 7PM-7AM, please contact night-coverage  [email or web address]  Password [email or web address]

## 2013-04-06 NOTE — Telephone Encounter (Signed)
Spoke with patient's wife and let her know I did not have any records showing a temporary PM.  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  HE went back for a dressing change a few weeks ago and was admitted for CP.  I told her that I knew nothing about him having a PPM implanted and the ER was the right place for him if his K was high(labs drawn in [place]) and his Temporary  was falling out.  They can have his device interrogated at ER and proceed accordingly,.  Wife verbalized understanding and agrees with plan of care

## 2013-04-07 ENCOUNTER — Encounter (HOSPITAL_COMMUNITY): Payer: Self-pay | Admitting: Internal Medicine

## 2013-04-07 DIAGNOSIS — Z950 Presence of cardiac pacemaker: Secondary | ICD-10-CM | POA: Diagnosis present

## 2013-04-07 DIAGNOSIS — E872 Acidosis, unspecified: Secondary | ICD-10-CM | POA: Diagnosis present

## 2013-04-07 DIAGNOSIS — S31109A Unspecified open wound of abdominal wall, unspecified quadrant without penetration into peritoneal cavity, initial encounter: Secondary | ICD-10-CM

## 2013-04-07 DIAGNOSIS — N390 Urinary tract infection, site not specified: Secondary | ICD-10-CM | POA: Diagnosis present

## 2013-04-07 DIAGNOSIS — E875 Hyperkalemia: Secondary | ICD-10-CM | POA: Diagnosis present

## 2013-04-07 LAB — COMPREHENSIVE METABOLIC PANEL
AST: 12 U/L (ref 0–37)
Alkaline Phosphatase: 87 U/L (ref 39–117)
BUN: 62 mg/dL — ABNORMAL HIGH (ref 6–23)
CO2: 13 mEq/L — ABNORMAL LOW (ref 19–32)
Calcium: 9.2 mg/dL (ref 8.4–10.5)
Chloride: 105 mEq/L (ref 96–112)
Creatinine, Ser: 1.92 mg/dL — ABNORMAL HIGH (ref 0.50–1.35)
GFR calc Af Amer: 41 mL/min — ABNORMAL LOW (ref >90)
GFR calc non Af Amer: 35 mL/min — ABNORMAL LOW (ref >90)
Glucose, Bld: 164 mg/dL — ABNORMAL HIGH (ref 70–99)
Sodium: 129 mEq/L — ABNORMAL LOW (ref 135–145)
Total Bilirubin: 0.3 mg/dL (ref 0.3–1.2)
Total Protein: 7 g/dL (ref 6.0–8.3)

## 2013-04-07 LAB — GLUCOSE, CAPILLARY
Glucose-Capillary: 147 mg/dL — ABNORMAL HIGH (ref 70–99)
Glucose-Capillary: 166 mg/dL — ABNORMAL HIGH (ref 70–99)
Glucose-Capillary: 166 mg/dL — ABNORMAL HIGH (ref 70–99)

## 2013-04-07 LAB — CBC
HCT: 30.1 % — ABNORMAL LOW (ref 39.0–52.0)
Hemoglobin: 10.2 g/dL — ABNORMAL LOW (ref 13.0–17.0)
MCH: 28.9 pg (ref 26.0–34.0)
MCHC: 33.9 g/dL (ref 30.0–36.0)
MCV: 85.3 fL (ref 78.0–100.0)
Platelets: 216 10*3/uL (ref 150–400)
WBC: 8.8 10*3/uL (ref 4.0–10.5)

## 2013-04-07 LAB — NA AND K (SODIUM & POTASSIUM), RAND UR: Sodium, Ur: 47 mEq/L

## 2013-04-07 MED ORDER — LEVOFLOXACIN 750 MG PO TABS
750.0000 mg | ORAL_TABLET | ORAL | Status: DC
  Administered 2013-04-07: 750 mg via ORAL
  Filled 2013-04-07: qty 1

## 2013-04-07 MED ORDER — SODIUM BICARBONATE 8.4 % IV SOLN
INTRAVENOUS | Status: DC
  Administered 2013-04-07 – 2013-04-08 (×3): via INTRAVENOUS
  Filled 2013-04-07 (×7): qty 100

## 2013-04-07 NOTE — Consult Note (Signed)
Reason for Consult: Hyperkalemia, metabolic acidosis and acute kidney injury. Referring Physician: Dr. Blane Ohara is an 62 y.o. male.  HPI: Mr. Priebe is a patient was multiple medical history including history of prostate status post prostate surgery, history of renal CA status post left nephrectomy, history of bladder cancer status post cystectomy and what is lymphoma. Presently patient came with nausea, vomiting and diarrhea. According to the patient when his blood work was done as outpatient was found to have potassium of 7.4 he is sent here for further workup. Patient with history of chronic renal failure. Presently he denies any nausea vomiting he seems to be feeling better. Patient also denies any difficulty in breathing  Past Medical History  Diagnosis Date  . Mixed hyperlipidemia   . 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 HERNIA, VENTRAL   . CAROTID BRUIT   . Coronary artery disease     h/o MI and bypass surgery x 3 in 2009 complicated by sternal wound infection, sees Dr. Eden Emms  . Vein symptom     injury to left leg due to conveyor belt.  caused injury to veins in left leg/ s/p surgery   . Sinus congestion     has allergies  . AODM   . CKD (chronic kidney disease), stage III     has one kidney.  had left kidney removed in 2011 for cancer  . Hiatal hernia     s/p hiatal hernia surgery 2010  . Anxiety     history of panic attacks  . Depression     takes celexa  . Atrial flutter 11/19/2011  . Atrial fibrillation   . Cancer      kidney ca, also mass to left neck  . Lymphoma   . Bladder cancer   . Prostate cancer   . Non Hodgkin's lymphoma     Past Surgical History  Procedure Laterality Date  . Cholecystectomy    . Kidney surgery    . Hernia repair      2010  . Coronary artery bypass graft      x3 .  Dr. Ellyn Hack is present cardiologist  . Vein surgery      s/p injury to left leg from conveyor  belt  . Cardiac catheterization      2009  . Portacath placement    . Tee without cardioversion  01/07/2012    Procedure: TRANSESOPHAGEAL ECHOCARDIOGRAM (TEE);  Surgeon: Pricilla Riffle, MD;  Location: [facility];  Service: Cardiovascular;  Laterality: N/A;  . Atrial flutter ablation  01/07/12    CTI ablation by Dr Johney Frame  . Bladder surgery    . Prostate surgery    . Temporary pacemaker insertion      No family history on file.  Social History:  reports that he quit smoking about 3 years ago. His smoking use included Cigarettes. He has a 40 pack-year smoking history. He does not have any smokeless tobacco history on file. He reports that he uses illicit drugs (Marijuana). He reports that he does not drink alcohol.  Allergies:  Allergies  Allergen Reactions  . Penicillins Swelling  . Sulfonamide Derivatives Rash    Medications: I have reviewed the patient's current medications.  Results for orders placed during the hospital encounter of 04/06/13 (from the past 48 hour(s))  CBC WITH DIFFERENTIAL     Status: Abnormal   Collection Time  04/06/13  5:57 PM      Result Value Range   WBC 9.9  4.0 - 10.5 K/uL   RBC 3.48 (*) 4.22 - 5.81 MIL/uL   Hemoglobin 10.3 (*) 13.0 - 17.0 g/dL   HCT 16.1 (*) 09.6 - 04.5 %   MCV 86.2  78.0 - 100.0 fL   MCH 29.6  26.0 - 34.0 pg   MCHC 34.3  30.0 - 36.0 g/dL   RDW 40.9 (*) 81.1 - 91.4 %   Platelets 189  150 - 400 K/uL   Neutrophils Relative % 76  43 - 77 %   Neutro Abs 7.6  1.7 - 7.7 K/uL   Lymphocytes Relative 15  12 - 46 %   Lymphs Abs 1.5  0.7 - 4.0 K/uL   Monocytes Relative 7  3 - 12 %   Monocytes Absolute 0.7  0.1 - 1.0 K/uL   Eosinophils Relative 1  0 - 5 %   Eosinophils Absolute 0.1  0.0 - 0.7 K/uL   Basophils Relative 0  0 - 1 %   Basophils Absolute 0.0  0.0 - 0.1 K/uL  COMPREHENSIVE METABOLIC PANEL     Status: Abnormal   Collection Time    04/06/13  5:57 PM      Result Value Range   Sodium 123 (*) 135 - 145 mEq/L   Potassium 6.6  (*) 3.5 - 5.1 mEq/L   Comment: CRITICAL RESULT CALLED TO, READ BACK BY AND VERIFIED WITH:     A. ANDERSON AT 7829 ON 04/06/13 BY Wynonia Lawman   Chloride 103  96 - 112 mEq/L   CO2 8 (*) 19 - 32 mEq/L   Comment: CRITICAL RESULT CALLED TO, READ BACK BY AND VERIFIED WITH:     A. ANDERSON AT 1848 ON 04/06/13 BY S. VANHOORNE   Glucose, Bld 166 (*) 70 - 99 mg/dL   BUN 68 (*) 6 - 23 mg/dL   Creatinine, Ser 5.62 (*) 0.50 - 1.35 mg/dL   Calcium 8.9  8.4 - 13.0 mg/dL   Total Protein 6.7  6.0 - 8.3 g/dL   Albumin 3.1 (*) 3.5 - 5.2 g/dL   AST 9  0 - 37 U/L   ALT 7  0 - 53 U/L   Alkaline Phosphatase 82  39 - 117 U/L   Total Bilirubin 0.3  0.3 - 1.2 mg/dL   GFR calc non Af Amer 30 (*) >90 mL/min   GFR calc Af Amer 35 (*) >90 mL/min   Comment: (NOTE)     The eGFR has been calculated using the CKD EPI equation.     This calculation has not been validated in all clinical situations.     eGFR's persistently <90 mL/min signify possible Chronic Kidney     Disease.  LIPASE, BLOOD     Status: None   Collection Time    04/06/13  5:57 PM      Result Value Range   Lipase 22  11 - 59 U/L  TROPONIN I     Status: None   Collection Time    04/06/13  5:57 PM      Result Value Range   Troponin I <0.30  <0.30 ng/mL   Comment:            Due to the release kinetics of cTnI,     a negative result within the first hours     of the onset of symptoms does not rule out     myocardial  infarction with certainty.     If myocardial infarction is still suspected,     repeat the test at appropriate intervals.  LACTIC ACID, PLASMA     Status: None   Collection Time    04/06/13  5:57 PM      Result Value Range   Lactic Acid, Venous 0.5  0.5 - 2.2 mmol/L  SALICYLATE LEVEL     Status: Abnormal   Collection Time    04/06/13  5:57 PM      Result Value Range   Salicylate Lvl <2.0 (*) 2.8 - 20.0 mg/dL  ACETAMINOPHEN LEVEL     Status: None   Collection Time    04/06/13  5:57 PM      Result Value Range   Acetaminophen  (Tylenol), Serum <15.0  10 - 30 ug/mL   Comment:            THERAPEUTIC CONCENTRATIONS VARY     SIGNIFICANTLY. A RANGE OF 10-30     ug/mL MAY BE AN EFFECTIVE     CONCENTRATION FOR MANY PATIENTS.     HOWEVER, SOME ARE BEST TREATED     AT CONCENTRATIONS OUTSIDE THIS     RANGE.     ACETAMINOPHEN CONCENTRATIONS     >150 ug/mL AT 4 HOURS AFTER     INGESTION AND >50 ug/mL AT 12     HOURS AFTER INGESTION ARE     OFTEN ASSOCIATED WITH TOXIC     REACTIONS.  URINALYSIS, ROUTINE W REFLEX MICROSCOPIC     Status: Abnormal   Collection Time    04/06/13  6:47 PM      Result Value Range   Color, Urine YELLOW  YELLOW   APPearance CLOUDY (*) CLEAR   Specific Gravity, Urine 1.015  1.005 - 1.030   pH 6.5  5.0 - 8.0   Glucose, UA NEGATIVE  NEGATIVE mg/dL   Hgb urine dipstick MODERATE (*) NEGATIVE   Bilirubin Urine SMALL (*) NEGATIVE   Ketones, ur NEGATIVE  NEGATIVE mg/dL   Protein, ur 161 (*) NEGATIVE mg/dL   Urobilinogen, UA 0.2  0.0 - 1.0 mg/dL   Nitrite NEGATIVE  NEGATIVE   Leukocytes, UA SMALL (*) NEGATIVE  URINE MICROSCOPIC-ADD ON     Status: Abnormal   Collection Time    04/06/13  6:47 PM      Result Value Range   WBC, UA 21-50  <3 WBC/hpf   Bacteria, UA MANY (*) RARE   Casts GRANULAR CAST (*) NEGATIVE   Comment: HYALINE CASTS  BLOOD GAS, ARTERIAL     Status: Abnormal   Collection Time    04/06/13  7:10 PM      Result Value Range   FIO2 21.00     Delivery systems ROOM AIR     pH, Arterial 7.147 (*) 7.350 - 7.450   Comment: CRITICAL RESULT CALLED TO, READ BACK BY AND VERIFIED WITH:     BRENDA NORMAN,RN BY K KNICK RRT RCP ON 04/06/2013 AT 1920   pCO2 arterial 20.1 (*) 35.0 - 45.0 mmHg   pO2, Arterial 116.0 (*) 80.0 - 100.0 mmHg   Bicarbonate 6.7 (*) 20.0 - 24.0 mEq/L   TCO2 6.6  0 - 100 mmol/L   Acid-Base Excess 20.7 (*) 0.0 - 2.0 mmol/L   O2 Saturation 99.3     Collection site RIGHT BRACHIAL     Drawn by [id number]     Sample type ARTERIAL     Allens test (pass/fail) NOT INDICATED  (*) PASS  CHLORIDE, URINE, RANDOM     Status: None   Collection Time    04/07/13 12:11 AM      Result Value Range   Chloride Urine 37    NA AND K (SODIUM & POTASSIUM), RAND UR     Status: None   Collection Time    04/07/13 12:11 AM      Result Value Range   Sodium, Ur 47     Potassium Urine Timed 19    CBC     Status: Abnormal   Collection Time    04/07/13  6:03 AM      Result Value Range   WBC 8.8  4.0 - 10.5 K/uL   RBC 3.53 (*) 4.22 - 5.81 MIL/uL   Hemoglobin 10.2 (*) 13.0 - 17.0 g/dL   HCT 16.1 (*) 09.6 - 04.5 %   MCV 85.3  78.0 - 100.0 fL   MCH 28.9  26.0 - 34.0 pg   MCHC 33.9  30.0 - 36.0 g/dL   RDW 40.9 (*) 81.1 - 91.4 %   Platelets 216  150 - 400 K/uL  COMPREHENSIVE METABOLIC PANEL     Status: Abnormal   Collection Time    04/07/13  6:03 AM      Result Value Range   Sodium 129 (*) 135 - 145 mEq/L   Potassium 4.8  3.5 - 5.1 mEq/L   Comment: DELTA CHECK NOTED   Chloride 105  96 - 112 mEq/L   CO2 13 (*) 19 - 32 mEq/L   Glucose, Bld 164 (*) 70 - 99 mg/dL   BUN 62 (*) 6 - 23 mg/dL   Creatinine, Ser 7.82 (*) 0.50 - 1.35 mg/dL   Calcium 9.2  8.4 - 95.6 mg/dL   Total Protein 7.0  6.0 - 8.3 g/dL   Albumin 3.0 (*) 3.5 - 5.2 g/dL   AST 12  0 - 37 U/L   ALT 7  0 - 53 U/L   Alkaline Phosphatase 87  39 - 117 U/L   Total Bilirubin 0.3  0.3 - 1.2 mg/dL   GFR calc non Af Amer 35 (*) >90 mL/min   GFR calc Af Amer 41 (*) >90 mL/min   Comment: (NOTE)     The eGFR has been calculated using the CKD EPI equation.     This calculation has not been validated in all clinical situations.     eGFR's persistently <90 mL/min signify possible Chronic Kidney     Disease.  GLUCOSE, CAPILLARY     Status: Abnormal   Collection Time    04/07/13  7:26 AM      Result Value Range   Glucose-Capillary 147 (*) 70 - 99 mg/dL   Comment 1 Documented in Chart     Comment 2 Notify RN      Dg Chest 2 View  04/06/2013   CLINICAL DATA:  Nausea. Dizziness. Unspecified problems with the indwelling  pacemaker. Prior CABG. Current history of non-Hodgkin's lymphoma.  EXAM: CHEST  2 VIEW  COMPARISON:  One view chest x-ray 03/31/2013. Two-view chest x-ray 04/25/2012. PET-CT 08/08/2012.  FINDINGS: Prior sternotomy for CABG. Cardiac silhouette normal in size, unchanged. Thoracic aorta atherosclerotic, unchanged. Hilar and mediastinal contours otherwise unremarkable. Lungs clear. Bronchovascular markings normal. Pulmonary vascularity normal. No visible pleural effusions. No pneumothorax. Degenerative changes involving the lower thoracic spine. Right jugular Port-A-Cath tip in the lower SVC. Left subclavian single lead transvenous pacemaker with the lead tip at the expected location of the RV apex, unchanged.  IMPRESSION: Support apparatus satisfactory. No acute cardiopulmonary disease.   Electronically Signed   By: Hulan Saas   On: 04/06/2013 18:33    Review of Systems  Constitutional: Negative for fever.  Cardiovascular: Negative for orthopnea and leg swelling.  Gastrointestinal: Positive for nausea, vomiting and diarrhea.  Neurological: Positive for weakness.   Blood pressure 134/53, pulse 61, temperature 98 F (36.7 C), temperature source Oral, resp. rate 18, height 6' (1.829 m), weight 100.971 kg (222 lb 9.6 oz), SpO2 99.00%. Physical Exam  Constitutional: He is oriented to person, place, and time. No distress.  Eyes: No scleral icterus.  Neck: No JVD present.  Cardiovascular: Normal rate and regular rhythm.   No murmur heard. Respiratory: No respiratory distress. He has no wheezes.  GI: He exhibits no distension. There is no tenderness. There is no rebound.  Musculoskeletal: He exhibits no edema.  Neurological: He is alert and oriented to person, place, and time.    Assessment/Plan: Problem #1 hyperkalemia. His a combination of high potassium intake and renal failure. Problem #2 severe metabolic acidosis. Patient is status post cystectomy with possible ileal conduit. Problem #3  renal failure. This seems to be acute on chronic as patient has creatinine of 1.68 on 05/17/2011 and 1.51 on 04/25/2012. Hence patient with underlying stage IIIB chronic renal failure. The present increasing BUN and creatinine could be secondary to prerenal syndrome. Presently his BUN and creatinine has improved. Problem #4 history of renal cancer status post left nephrectomy Problem #5 history of bladder cancer status post cystectomy Problem #6 history of coronary disease status post CABG Problem #7 history of anxiety disorder Problem #8 hyponatremia most likely hypovolemic hyponatremia sodium is 129 improving. Plan: We'll continue with D5 water with sodium bicarbonate at 125 cc per hour. We'll check his basic metabolic panel in the morning. We'll check his phosphorus, 25 vitamin D and intact PTH the morning.  Briea Mcenery S 04/07/2013, 8:23 AM

## 2013-04-07 NOTE — Progress Notes (Signed)
ANTIBIOTIC CONSULT NOTE - INITIAL  Pharmacy Consult for Levaquin Indication: UTI  Allergies  Allergen Reactions  . Penicillins Swelling  . Sulfonamide Derivatives Rash    Patient Measurements: Height: 6' (182.9 cm) Weight: 222 lb 9.6 oz (100.971 kg) IBW/kg (Calculated) : 77.6  Vital Signs: Temp: 98 F (36.7 C) (09/27 0415) Temp src: Oral (09/27 0415) BP: 134/53 mmHg (09/27 0415) Pulse Rate: 61 (09/27 0415) Intake/Output from previous day: 09/26 0701 - 09/27 0700 In: 0  Out: 1050 [Urine:1050] Intake/Output from this shift: Total I/O In: -  Out: 200 [Urine:200]  Labs:  Recent Labs  04/05/13 1520 04/06/13 0935 04/06/13 1757 04/07/13 0603  WBC 12.0*  --  9.9 8.8  HGB 11.1*  --  10.3* 10.2*  PLT 226  --  189 216  CREATININE 1.99* 2.16* 2.18* 1.92*   Estimated Creatinine Clearance: 48.5 ml/min (by C-G formula based on Cr of 1.92). No results found for this basename: VANCOTROUGH, VANCOPEAK, VANCORANDOM, GENTTROUGH, GENTPEAK, GENTRANDOM, TOBRATROUGH, TOBRAPEAK, TOBRARND, AMIKACINPEAK, AMIKACINTROU, AMIKACIN,  in the last 72 hours   Microbiology: No results found for this or any previous visit (from the past 720 hour(s)).  Medical History: Past Medical History  Diagnosis Date  . Mixed hyperlipidemia   . 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 HERNIA, VENTRAL   . CAROTID BRUIT   . Coronary artery disease     h/o MI and bypass surgery x 3 in 2009 complicated by sternal wound infection, sees Dr. Eden Emms  . Vein symptom     injury to left leg due to conveyor belt.  caused injury to veins in left leg/ s/p surgery   . Sinus congestion     has allergies  . AODM   . CKD (chronic kidney disease), stage III     has one kidney.  had left kidney removed in 2011 for cancer  . Hiatal hernia     s/p hiatal hernia surgery 2010  . Anxiety     history of panic attacks  . Depression     takes celexa  . Atrial  flutter 11/19/2011  . Atrial fibrillation   . Cancer      kidney ca, also mass to left neck  . Lymphoma   . Bladder cancer   . Prostate cancer   . Non Hodgkin's lymphoma   . Open abdominal wall wound   . Chronic kidney disease, stage III (moderate)   . Acute renal failure 04/06/2013    With hyperkalemia and met acidosis    Medications:  Prescriptions prior to admission  Medication Sig Dispense Refill  . ALPRAZolam (XANAX) 1 MG tablet Take 1 mg by mouth at bedtime as needed for sleep or anxiety.      Marland Kitchen amLODipine (NORVASC) 5 MG tablet Take 5 mg by mouth daily.      Marland Kitchen aspirin EC 81 MG tablet Take 81 mg by mouth at bedtime.       . citalopram (CELEXA) 20 MG tablet Take 1 tablet (20 mg total) by mouth daily.  30 tablet  2  . furosemide (LASIX) 20 MG tablet Take 1 tablet (20 mg total) by mouth daily.  30 tablet  5  . glimepiride (AMARYL) 4 MG tablet Take 1 tablet (4 mg total) by mouth daily before breakfast.  30 tablet  2  . HYDROcodone-acetaminophen (NORCO/VICODIN) 5-325 MG per tablet Take 1 tablet by mouth every 6 (six) hours as  needed. For pain      . lisinopril (PRINIVIL,ZESTRIL) 40 MG tablet Take 40 mg by mouth daily.      Marland Kitchen nystatin-triamcinolone (MYCOLOG II) cream Apply 1 application topically daily. To apply when changing dressing      . rosuvastatin (CRESTOR) 5 MG tablet Take 5 mg by mouth at bedtime.       . isosorbide mononitrate (IMDUR) 30 MG 24 hr tablet Take 30 mg by mouth daily.      . metoprolol tartrate (LOPRESSOR) 25 MG tablet Take 25 mg by mouth 2 (two) times daily.        Assessment: Okay for Protocol Pyuria. We'll treat as a urinary tract infection in the setting of recent hospitalization per MD note.  Goal of Therapy:  Eradicate infection.  Plan:  Levaquin 750mg  PO every 48 hours. Follow up culture results  Mady Gemma 04/07/2013,12:34 PM

## 2013-04-07 NOTE — Progress Notes (Signed)
[facility] PROGRESS NOTE  Frederick Foster NFA:[id number] DOB: October 28, 1950 DOA: 04/06/2013 PCP: Rudi Heap, MD    Code Status: Full code Family Communication: Discussed with patient is past or in close friend per patient's permission. Disposition Plan: Eventually discharged to home when clinically appropriate.   Consultants:  Nephrology  Procedures:  None  Antibiotics:  Levaquin pending 04/07/2013  HPI/Subjective: The patient has no complaints of chest pain, palpitations, shortness of breath, or abdominal pain. He denies nausea and vomiting currently.  Objective: Filed Vitals:   04/07/13 0415  BP: 134/53  Pulse: 61  Temp: 98 F (36.7 C)  Resp: 18    Intake/Output Summary (Last 24 hours) at 04/07/13 1157 Last data filed at 04/07/13 0753  Gross per 24 hour  Intake      0 ml  Output   1250 ml  Net  -1250 ml   Filed Weights   04/06/13 1629 04/06/13 2217  Weight: 108.41 kg (239 lb) 100.971 kg (222 lb 9.6 oz)    Exam:   General:  Alert obese 62 year old Caucasian man laying in bed, in no acute distress.  Cardiovascular: S1, S2, with a soft systolic murmur.  Respiratory: Clear anteriorly with decreased breath sounds in the bases. Breathing is nonlabored.  Abdomen: Open abdominal wound over the hypogastrium with serous drainage; not malodorous, pink healing scar. Ileal conduit located at the the right abdomen was cleared yellow urine. Bowel sounds are present. Minimal global tenderness without rebound or rigidity.  Musculoskeletal: No acute hot red joints.   Neurologic: He is alert and oriented x3. Cranial nerves II through XII are intact.   Data Reviewed: Basic Metabolic Panel:  Recent Labs Lab 04/05/13 1520 04/06/13 0935 04/06/13 1757 04/07/13 0603  NA 126* 127* 123* 129*  K 7.2* 6.7* 6.6* 4.8  CL 102 102 103 105  CO2 9* 8* 8* 13*  GLUCOSE 165* 132* 166* 164*  BUN 65* 66* 68* 62*  CREATININE 1.99* 2.16* 2.18* 1.92*  CALCIUM 9.3 9.2 8.9  9.2   Liver Function Tests:  Recent Labs Lab 04/05/13 1520 04/06/13 1757 04/07/13 0603  AST 9 9 12   ALT 7 7 7   ALKPHOS 96 82 87  BILITOT 0.2 0.3 0.3  PROT 6.8 6.7 7.0  ALBUMIN  --  3.1* 3.0*    Recent Labs Lab 04/06/13 1757  LIPASE 22   No results found for this basename: AMMONIA,  in the last 168 hours CBC:  Recent Labs Lab 04/05/13 1520 04/06/13 1757 04/07/13 0603  WBC 12.0* 9.9 8.8  NEUTROABS 8.8* 7.6  --   HGB 11.1* 10.3* 10.2*  HCT 32.6* 30.0* 30.1*  MCV 87 86.2 85.3  PLT 226 189 216   Cardiac Enzymes:  Recent Labs Lab 04/06/13 1757  TROPONINI <0.30   BNP (last 3 results) No results found for this basename: PROBNP,  in the last 8760 hours CBG:  Recent Labs Lab 04/07/13 0726 04/07/13 1142  GLUCAP 147* 166*    No results found for this or any previous visit (from the past 240 hour(s)).   Studies: Dg Chest 2 View  04/06/2013   CLINICAL DATA:  Nausea. Dizziness. Unspecified problems with the indwelling pacemaker. Prior CABG. Current history of non-Hodgkin's lymphoma.  EXAM: CHEST  2 VIEW  COMPARISON:  One view chest x-ray 03/31/2013. Two-view chest x-ray 04/25/2012. PET-CT 08/08/2012.  FINDINGS: Prior sternotomy for CABG. Cardiac silhouette normal in size, unchanged. Thoracic aorta atherosclerotic, unchanged. Hilar and mediastinal contours otherwise unremarkable. Lungs clear. Bronchovascular markings normal. Pulmonary  vascularity normal. No visible pleural effusions. No pneumothorax. Degenerative changes involving the lower thoracic spine. Right jugular Port-A-Cath tip in the lower SVC. Left subclavian single lead transvenous pacemaker with the lead tip at the expected location of the RV apex, unchanged.  IMPRESSION: Support apparatus satisfactory. No acute cardiopulmonary disease.   Electronically Signed   By: Hulan Saas   On: 04/06/2013 18:33    Scheduled Meds: . amLODipine  5 mg Oral Daily  . aspirin EC  81 mg Oral QHS  . atorvastatin  10 mg  Oral q1800  . citalopram  20 mg Oral Daily  . heparin  5,000 Units Subcutaneous Q8H  . insulin aspart  0-9 Units Subcutaneous TID WC  . isosorbide mononitrate  30 mg Oral Daily  . metoprolol tartrate  25 mg Oral BID  . sodium chloride  3 mL Intravenous Q12H   Continuous Infusions: .  sodium bicarbonate  infusion 1000 mL 125 mL/hr at 04/07/13 1011   Assessment:  Principal Problem:   Hyperkalemia Active Problems:   ARF (acute renal failure)   Hyponatremia   Metabolic acidosis   HTN (hypertension)   Solitary kidney   DM (diabetes mellitus), type 2, uncontrolled   CHB (complete heart block)   Depression with anxiety   Cardiac pacemaker in situ   Open abdominal wall wound   UTI (urinary tract infection)   1. Hyperkalemia secondary to acute renal failure in the setting of ACE inhibitor therapy. Serum potassium 7.2 on admission. Status post treatment in the emergency department. He is now on a bicarbonate drip which has helped to normalize his serum potassium. Dr. Kristian Covey is following. Appreciate his assistance.  Severe metabolic acidosis. This is likely secondary to losses because of his cystectomy and ileal conduit in the setting of acute renal failure. Bicarbonate 9 on admission. He is on bicarbonate drip with some improvement.  Hyponatremia, secondary to acute renal failure and hypovolemia. Improving with IV fluids.  Acute renal failure superimposed on stage III chronic kidney disease. Probable ATN versus prerenal azotemia. Some improvement overnight. His creatinine was 1.68 November 2012 and 1.51 October 2013. Patient has one kidney with history of nephrectomy for renal cell carcinoma.  Recent hospitalization for prostate cancer, status post prostatectomy at [facility] and history of cystectomy/ileal conduit and history of renal cell carcinoma, status post nephrectomy. The patient has an open wound that appears not to be infected. He was attending to the abdominal wound  with dressing changes daily at home. He does not appear to be infected.  Pyuria. We'll treat as a urinary tract infection in the setting of recent hospitalization.  Recent diagnosis of complete heart block, status post temporary pacemaker. His heart rate is currently within normal limits.  CAD. Currently stable. Continue cardiac medications.  Hypertension. His blood pressure is stable. Lisinopril and furosemide on hold because of acute renal failure. We'll continue amlodipine and metoprolol.  Diabetes mellitus. Oral hypoglycemic agents are on hold. CBGs are reasonable thus far. We'll continue sliding scale NovoLog.      Plan: 1. We'll start Levaquin empirically for treatment of UTI. 2. Continue bicarbonate drip (D5 with sodium bicarbonate) at 125 cc an hour. 3. Phosphorus, vitamin D, and PTH ordered and pending per nephrology. 4. Continue supportive treatment otherwise. 5. Wound care consult.  Time spent: 40 minutes.    [facility]  [facility] Pager [phone]. If 7PM-7AM, please contact night-coverage at [email or web address], password [facility] 04/07/2013, 11:57 AM  LOS: 1 day

## 2013-04-07 NOTE — Progress Notes (Signed)
INITIAL NUTRITION ASSESSMENT  DOCUMENTATION CODES Per approved criteria  -Severe malnutrition in the context of acute illness or injury   Pt meets criteria for severe MALNUTRITION in the context of acute illness or injury  as evidenced by 5.1% wt loss x 1 month and <50% energy intake x 5 days.  INTERVENTION: Follow for diet advancement.  Add Resource Breeze po TID, each supplement provides 250 kcal and 9 grams of protein, once diet has been advanced.   NUTRITION DIAGNOSIS: Inadequate oral intake related to altered GI function as evidenced by n/v, NPO, 5.1% wt loss x 1 month.   Goal: Pt will meet >90% of estimated nutritional needs  Monitor:  Diet advancement and tolerance, PO intake, weight changes, labs, skin integrity, changes in status  Reason for Assessment: MST=4  63 y.o. male  Admitting Dx: Hyperkalemia  ASSESSMENT: Pt admitted with hyperkalemia and n/v/d and decreased appetite x 1 week. Chart review reveals pt has been unable to keep down any foods or liquids and he will vomit if he attempts to consume food or liquids.  Pt with hx of multiple hospitalizations. S/p cystectomy and prostatectomy x 1 month at [facility] d/t bladder cancer. Wt hx reveals progressive wt loss for the past year. Noted 35# (13.6%) x 1 yr and 30# (11.9%) x 7 months. Pt has also experienced a 40# (15.2%) wt loss x 3 months and 12# (5.1%) wt loss x 1 month, both which are clinically significant.  Unable to complete nutrition focused physical exam at time of visit.   Height: Ht Readings from Last 1 Encounters:  04/06/13 6' (1.829 m)    Weight: Wt Readings from Last 1 Encounters:  04/06/13 222 lb 9.6 oz (100.971 kg)    Ideal Body Weight: 178#  % Ideal Body Weight: 125%  Wt Readings from Last 10 Encounters:  04/06/13 222 lb 9.6 oz (100.971 kg)  04/05/13 221 lb (100.245 kg)  02/23/13 234 lb (106.142 kg)  01/02/13 262 lb (118.842 kg)  11/22/12 261 lb (118.389 kg)  11/01/12 252 lb (114.306 kg)   10/23/12 256 lb (116.121 kg)  04/26/12 258 lb 12.8 oz (117.391 kg)  04/25/12 257 lb (116.574 kg)  02/09/12 255 lb 3.2 oz (115.758 kg)    Usual Body Weight: 255#  % Usual Body Weight: 87%  BMI:  Body mass index is 30.18 kg/(m^2). Meets criteria for obesity, class I.   Estimated Nutritional Needs: Kcal: 1800-1900 kcals daily Protein: 81-101 grams protein daily Fluid: 1.8-1.9 L fluid daily  Skin: 6" healing abdominal incision noted  Diet Order: NPO  EDUCATION NEEDS: -Education not appropriate at this time   Intake/Output Summary (Last 24 hours) at 04/07/13 1336 Last data filed at 04/07/13 0753  Gross per 24 hour  Intake      0 ml  Output   1250 ml  Net  -1250 ml    Last BM: 04/07/13   Labs:   Recent Labs Lab 04/06/13 0935 04/06/13 1757 04/07/13 0603  NA 127* 123* 129*  K 6.7* 6.6* 4.8  CL 102 103 105  CO2 8* 8* 13*  BUN 66* 68* 62*  CREATININE 2.16* 2.18* 1.92*  CALCIUM 9.2 8.9 9.2  GLUCOSE 132* 166* 164*    CBG (last 3)   Recent Labs  04/07/13 0726 04/07/13 1142  GLUCAP 147* 166*    Scheduled Meds: . amLODipine  5 mg Oral Daily  . aspirin EC  81 mg Oral QHS  . atorvastatin  10 mg Oral q1800  . citalopram  20 mg Oral Daily  . heparin  5,000 Units Subcutaneous Q8H  . insulin aspart  0-9 Units Subcutaneous TID WC  . isosorbide mononitrate  30 mg Oral Daily  . levofloxacin  750 mg Oral Q48H  . metoprolol tartrate  25 mg Oral BID  . sodium chloride  3 mL Intravenous Q12H    Continuous Infusions: .  sodium bicarbonate  infusion 1000 mL 125 mL/hr at 04/07/13 1011    Past Medical History  Diagnosis Date  . Mixed hyperlipidemia   . 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 HERNIA, VENTRAL   . CAROTID BRUIT   . Coronary artery disease     h/o MI and bypass surgery x 3 in 2009 complicated by sternal wound infection, sees Dr. Eden Emms  . Vein symptom     injury to left leg due to  conveyor belt.  caused injury to veins in left leg/ s/p surgery   . Sinus congestion     has allergies  . AODM   . CKD (chronic kidney disease), stage III     has one kidney.  had left kidney removed in 2011 for cancer  . Hiatal hernia     s/p hiatal hernia surgery 2010  . Anxiety     history of panic attacks  . Depression     takes celexa  . Atrial flutter 11/19/2011  . Atrial fibrillation   . Cancer      kidney ca, also mass to left neck  . Lymphoma   . Bladder cancer   . Prostate cancer   . Non Hodgkin's lymphoma   . Open abdominal wall wound   . Chronic kidney disease, stage III (moderate)   . Acute renal failure 04/06/2013    With hyperkalemia and met acidosis    Past Surgical History  Procedure Laterality Date  . Cholecystectomy    . Kidney surgery    . Hernia repair      2010  . Coronary artery bypass graft      x3 .  Dr. Ellyn Hack is present cardiologist  . Vein surgery      s/p injury to left leg from conveyor belt  . Cardiac catheterization      2009  . Portacath placement    . Tee without cardioversion  01/07/2012    Procedure: TRANSESOPHAGEAL ECHOCARDIOGRAM (TEE);  Surgeon: Pricilla Riffle, MD;  Location: [facility];  Service: Cardiovascular;  Laterality: N/A;  . Atrial flutter ablation  01/07/12    CTI ablation by Dr Johney Frame  . Bladder surgery    . Prostate surgery    . Temporary pacemaker insertion      Kaitlan Bin A. Mayford Knife, RD, LDN Pager: [phone]

## 2013-04-08 DIAGNOSIS — E43 Unspecified severe protein-calorie malnutrition: Secondary | ICD-10-CM | POA: Insufficient documentation

## 2013-04-08 LAB — BASIC METABOLIC PANEL
BUN: 49 mg/dL — ABNORMAL HIGH (ref 6–23)
Chloride: 100 mEq/L (ref 96–112)
GFR calc Af Amer: 51 mL/min — ABNORMAL LOW (ref >90)
GFR calc non Af Amer: 44 mL/min — ABNORMAL LOW (ref >90)
Glucose, Bld: 188 mg/dL — ABNORMAL HIGH (ref 70–99)
Potassium: 3.5 mEq/L (ref 3.5–5.1)
Sodium: 129 mEq/L — ABNORMAL LOW (ref 135–145)

## 2013-04-08 LAB — CBC
HCT: 24.1 % — ABNORMAL LOW (ref 39.0–52.0)
Hemoglobin: 8.5 g/dL — ABNORMAL LOW (ref 13.0–17.0)
MCH: 29.5 pg (ref 26.0–34.0)
MCHC: 35.3 g/dL (ref 30.0–36.0)
MCV: 83.7 fL (ref 78.0–100.0)
RBC: 2.88 MIL/uL — ABNORMAL LOW (ref 4.22–5.81)

## 2013-04-08 LAB — IRON AND TIBC
Iron: 35 ug/dL — ABNORMAL LOW (ref 42–135)
Saturation Ratios: 20 % (ref 20–55)
TIBC: 173 ug/dL — ABNORMAL LOW (ref 215–435)

## 2013-04-08 LAB — GLUCOSE, CAPILLARY
Glucose-Capillary: 124 mg/dL — ABNORMAL HIGH (ref 70–99)
Glucose-Capillary: 141 mg/dL — ABNORMAL HIGH (ref 70–99)

## 2013-04-08 LAB — FERRITIN: Ferritin: 150 ng/mL (ref 22–322)

## 2013-04-08 MED ORDER — SODIUM CHLORIDE 0.9 % IV SOLN
INTRAVENOUS | Status: DC
  Administered 2013-04-08: 23:00:00 via INTRAVENOUS
  Administered 2013-04-08: 75 mL/h via INTRAVENOUS

## 2013-04-08 MED ORDER — LEVOFLOXACIN 750 MG PO TABS
750.0000 mg | ORAL_TABLET | Freq: Every day | ORAL | Status: DC
  Administered 2013-04-08 – 2013-04-09 (×2): 750 mg via ORAL
  Filled 2013-04-08 (×2): qty 1

## 2013-04-08 NOTE — Progress Notes (Signed)
Subjective: Interval History: has no complaint of nausea or vomiting. Patient presently denies any difficulty breathing and he doesn't have any diarrhea..  Objective: Vital signs in last 24 hours: Temp:  [98.3 F (36.8 C)-98.9 F (37.2 C)] 98.9 F (37.2 C) (09/28 0544) Pulse Rate:  [59] 59 (09/28 0544) Resp:  [16-18] 18 (09/28 0544) BP: (97-132)/(44-57) 132/57 mmHg (09/28 0544) SpO2:  [96 %-100 %] 96 % (09/28 0544) Weight change:   Intake/Output from previous day: 09/27 0701 - 09/28 0700 In: 2016.7 [I.V.:2016.7] Out: 2050 [Urine:2050] Intake/Output this shift:    General appearance: alert, cooperative and no distress Resp: clear to auscultation bilaterally Cardio: regular rate and rhythm, S1, S2 normal, no murmur, click, rub or gallop GI: soft, non-tender; bowel sounds normal; no masses,  no organomegaly Extremities: extremities normal, atraumatic, no cyanosis or edema  Lab Results:  Recent Labs  04/07/13 0603 04/08/13 0546  WBC 8.8 6.1  HGB 10.2* 8.5*  HCT 30.1* 24.1*  PLT 216 169   BMET:  Recent Labs  04/07/13 0603 04/08/13 0546  NA 129* 129*  K 4.8 3.5  CL 105 100  CO2 13* 21  GLUCOSE 164* 188*  BUN 62* 49*  CREATININE 1.92* 1.61*  CALCIUM 9.2 8.1*   No results found for this basename: PTH,  in the last 72 hours Iron Studies:  Recent Labs  04/05/13 1520  IRON 42  TIBC 230*  FERRITIN 208    Studies/Results: Dg Chest 2 View  04/06/2013   CLINICAL DATA:  Nausea. Dizziness. Unspecified problems with the indwelling pacemaker. Prior CABG. Current history of non-Hodgkin's lymphoma.  EXAM: CHEST  2 VIEW  COMPARISON:  One view chest x-ray 03/31/2013. Two-view chest x-ray 04/25/2012. PET-CT 08/08/2012.  FINDINGS: Prior sternotomy for CABG. Cardiac silhouette normal in size, unchanged. Thoracic aorta atherosclerotic, unchanged. Hilar and mediastinal contours otherwise unremarkable. Lungs clear. Bronchovascular markings normal. Pulmonary vascularity normal. No  visible pleural effusions. No pneumothorax. Degenerative changes involving the lower thoracic spine. Right jugular Port-A-Cath tip in the lower SVC. Left subclavian single lead transvenous pacemaker with the lead tip at the expected location of the RV apex, unchanged.  IMPRESSION: Support apparatus satisfactory. No acute cardiopulmonary disease.   Electronically Signed   By: Hulan Saas   On: 04/06/2013 18:33    I have reviewed the patient's current medications.  Assessment/Plan: Problem #1 acute kidney injury superimposed on chronic his BUN and creatinine presently seems to be improving. The etiology could be prerenal/ATN. Problem #2 chronic renal failure stage III.  Problem #3 hyponatremia possibly hypovolemic hyponatremia sodium 129 low but stable.  problem #4 hyperkalemia his potassium has improved. Etiology could be secondary to ACE inhibitor/acute kidney injury/interstitial nephritis/Type  4 RTA Problem #5 metabolic acidosis he CO2 has corrected. Problem #6 diabetes Problem #7 hypertension his blood pressure seems to be reasonably controlled Problem #8 history renal CA Problem #9 history of bladder CA Problem #10 history of prostate CA Problem #11 history of non-Hodgkin's lymphoma.  Problem #12 anemia possibly iron deficiency anemia and anemia of chronic disease. Iron studies are pending. Plan: DC IV fluid Start patient on sodium bicarbonate 650 mg by mouth 3 times a day Patient advised to be low potassium diet. Change his IV fluid to to normal saline at 75 cc per hour and encourage by mouth fluid intake. Check his basic metabolic panel in the morning. We'll put patient on freewater restriction to 500 cc per day    LOS: 2 days   Frederick Foster S 04/08/2013,8:54 AM

## 2013-04-08 NOTE — Progress Notes (Signed)
[facility] PROGRESS NOTE  Frederick Foster:[id number] DOB: 1951/04/11 DOA: 04/06/2013 PCP: Rudi Heap, MD    Code Status: Full code Family Communication: Discussed with patient is past or in close friend per patient's permission. Disposition Plan: Eventually discharged to home when clinically appropriate. Physical therapy consultation in AM.  Possible discharge home in am.   Consultants:  Nephrology  Procedures:  None  Antibiotics:  Levaquin 04/07/2013  HPI/Subjective: No complaints, wants something to drink, overall feels better  Objective: Filed Vitals:   04/08/13 0544  BP: 132/57  Pulse: 59  Temp: 98.9 F (37.2 C)  Resp: 18    Intake/Output Summary (Last 24 hours) at 04/08/13 8119 Last data filed at 04/08/13 1478  Gross per 24 hour  Intake 2016.67 ml  Output   1850 ml  Net 166.67 ml   Filed Weights   04/06/13 1629 04/06/13 2217  Weight: 108.41 kg (239 lb) 100.971 kg (222 lb 9.6 oz)    Exam:   General:  Alert obese 62 year old Caucasian man laying in bed, in no acute distress.  Cardiovascular: S1, S2, with a soft systolic murmur.  Respiratory: Clear anteriorly with decreased breath sounds in the bases. Breathing is nonlabored.  Abdomen: Open abdominal wound over the hypogastrium with serous drainage; not malodorous, pink healing scar. Ileal conduit located at the the right abdomen was cleared yellow urine. Bowel sounds are present. No tenderness.  Musculoskeletal: No acute hot red joints.   Neurologic: He is alert and oriented x3. Cranial nerves II through XII are intact.   Data Reviewed: Basic Metabolic Panel:  Recent Labs Lab 04/05/13 1520 04/06/13 0935 04/06/13 1757 04/07/13 0603 04/08/13 0546  NA 126* 127* 123* 129* 129*  K 7.2* 6.7* 6.6* 4.8 3.5  CL 102 102 103 105 100  CO2 9* 8* 8* 13* 21  GLUCOSE 165* 132* 166* 164* 188*  BUN 65* 66* 68* 62* 49*  CREATININE 1.99* 2.16* 2.18* 1.92* 1.61*  CALCIUM 9.3 9.2 8.9 9.2  8.1*  PHOS  --   --   --   --  2.7   Liver Function Tests:  Recent Labs Lab 04/05/13 1520 04/06/13 1757 04/07/13 0603  AST 9 9 12   ALT 7 7 7   ALKPHOS 96 82 87  BILITOT 0.2 0.3 0.3  PROT 6.8 6.7 7.0  ALBUMIN  --  3.1* 3.0*    Recent Labs Lab 04/06/13 1757  LIPASE 22   No results found for this basename: AMMONIA,  in the last 168 hours CBC:  Recent Labs Lab 04/05/13 1520 04/06/13 1757 04/07/13 0603 04/08/13 0546  WBC 12.0* 9.9 8.8 6.1  NEUTROABS 8.8* 7.6  --   --   HGB 11.1* 10.3* 10.2* 8.5*  HCT 32.6* 30.0* 30.1* 24.1*  MCV 87 86.2 85.3 83.7  PLT 226 189 216 169   Cardiac Enzymes:  Recent Labs Lab 04/06/13 1757  TROPONINI <0.30   BNP (last 3 results) No results found for this basename: PROBNP,  in the last 8760 hours CBG:  Recent Labs Lab 04/07/13 0726 04/07/13 1142 04/07/13 1711 04/07/13 2210  GLUCAP 147* 166* 166* 166*    No results found for this or any previous visit (from the past 240 hour(s)).   Studies: Dg Chest 2 View  04/06/2013   CLINICAL DATA:  Nausea. Dizziness. Unspecified problems with the indwelling pacemaker. Prior CABG. Current history of non-Hodgkin's lymphoma.  EXAM: CHEST  2 VIEW  COMPARISON:  One view chest x-ray 03/31/2013. Two-view chest x-ray 04/25/2012. PET-CT 08/08/2012.  FINDINGS: Prior sternotomy for CABG. Cardiac silhouette normal in size, unchanged. Thoracic aorta atherosclerotic, unchanged. Hilar and mediastinal contours otherwise unremarkable. Lungs clear. Bronchovascular markings normal. Pulmonary vascularity normal. No visible pleural effusions. No pneumothorax. Degenerative changes involving the lower thoracic spine. Right jugular Port-A-Cath tip in the lower SVC. Left subclavian single lead transvenous pacemaker with the lead tip at the expected location of the RV apex, unchanged.  IMPRESSION: Support apparatus satisfactory. No acute cardiopulmonary disease.   Electronically Signed   By: Hulan Saas   On:  04/06/2013 18:33    Scheduled Meds: . amLODipine  5 mg Oral Daily  . aspirin EC  81 mg Oral QHS  . atorvastatin  10 mg Oral q1800  . citalopram  20 mg Oral Daily  . heparin  5,000 Units Subcutaneous Q8H  . insulin aspart  0-9 Units Subcutaneous TID WC  . isosorbide mononitrate  30 mg Oral Daily  . levofloxacin  750 mg Oral Daily  . metoprolol tartrate  25 mg Oral BID  . sodium chloride  3 mL Intravenous Q12H   Continuous Infusions: . sodium chloride     Assessment:  Principal Problem:   Hyperkalemia Active Problems:   HTN (hypertension)   Solitary kidney   Hyponatremia   ARF (acute renal failure)   DM (diabetes mellitus), type 2, uncontrolled   CHB (complete heart block)   Depression with anxiety   Cardiac pacemaker in situ   Metabolic acidosis   Open abdominal wall wound   UTI (urinary tract infection)   Chronic kidney disease, stage III (moderate)   Protein-calorie malnutrition, severe   1. Hyperkalemia secondary to acute renal failure in the setting of ACE inhibitor therapy. Serum potassium 7.2 on admission. Status post treatment in the emergency department. He was started on a bicarbonate drip which has helped to normalize his serum potassium. Dr. Kristian Covey is following. Appreciate his assistance.  Severe metabolic acidosis. This is likely secondary to losses because of his cystectomy and ileal conduit in the setting of acute renal failure. Bicarbonate 9 on admission. He was started on a bicarbonate infusion which has helped normalize his serum bicarbonate. This has since been changed to po bicarbonate.  Hyponatremia, secondary to acute renal failure and hypovolemia. Improving with IV fluids.  Acute renal failure superimposed on stage III chronic kidney disease. Probable ATN versus prerenal azotemia. Creatinine has now approached baseline. Patient has one kidney with history of nephrectomy for renal cell carcinoma.  Recent hospitalization for prostate cancer, status  post prostatectomy at [facility] and history of cystectomy/ileal conduit and history of renal cell carcinoma, status post nephrectomy. The patient has an open wound that appears not to be infected. He was attending to the abdominal wound with dressing changes daily at home. He does not appear to be infected. Wound care consult has been ordered.  Pyuria. We'll treat as a urinary tract infection in the setting of recent hospitalization. Continue levaquin and follow up urine culture.  Recent diagnosis of complete heart block, status post temporary pacemaker. His heart rate is currently within normal limits.  CAD. Currently stable. Continue cardiac medications.  Hypertension. His blood pressure is stable. Lisinopril and furosemide on hold because of acute renal failure. We'll continue amlodipine and metoprolol.  Diabetes mellitus. Oral hypoglycemic agents are on hold. CBGs are reasonable thus far. We'll continue sliding scale NovoLog.  Anemia, likely due to chronic renal disease, no evidence of bleeding, will continue to follow.  Time spent: 25 mins.    MEMON,JEHANZEB  [facility] Pager [phone]. If 7PM-7AM, please contact night-coverage at [email or web address], password [facility] 04/08/2013, 9:37 AM  LOS: 2 days

## 2013-04-08 NOTE — Progress Notes (Signed)
ANTIBIOTIC CONSULT NOTE - INITIAL  Pharmacy Consult for Levaquin Indication: UTI  Allergies  Allergen Reactions  . Penicillins Swelling  . Sulfonamide Derivatives Rash    Patient Measurements: Height: 6' (182.9 cm) Weight: 222 lb 9.6 oz (100.971 kg) IBW/kg (Calculated) : 77.6  Vital Signs: Temp: 98.9 F (37.2 C) (09/28 0544) Temp src: Oral (09/28 0544) BP: 132/57 mmHg (09/28 0544) Pulse Rate: 59 (09/28 0544) Intake/Output from previous day: 09/27 0701 - 09/28 0700 In: 2016.7 [I.V.:2016.7] Out: 2050 [Urine:2050] Intake/Output from this shift:    Labs:  Recent Labs  04/06/13 1757 04/07/13 0603 04/08/13 0546  WBC 9.9 8.8 6.1  HGB 10.3* 10.2* 8.5*  PLT 189 216 169  CREATININE 2.18* 1.92* 1.61*   Estimated Creatinine Clearance: 57.8 ml/min (by C-G formula based on Cr of 1.61). No results found for this basename: VANCOTROUGH, VANCOPEAK, VANCORANDOM, GENTTROUGH, GENTPEAK, GENTRANDOM, TOBRATROUGH, TOBRAPEAK, TOBRARND, AMIKACINPEAK, AMIKACINTROU, AMIKACIN,  in the last 72 hours   Microbiology: No results found for this or any previous visit (from the past 720 hour(s)).  Assessment: Okay for Protocol Renal function improving with an Estimated Creatinine Clearance: 57.8 ml/min (by C-G formula based on Cr of 1.61).   Goal of Therapy:  Eradicate infection.  Plan:  Change Levaquin 750mg  PO every 24 hours. Follow up culture results  Mady Gemma 04/08/2013,9:29 AM

## 2013-04-09 DIAGNOSIS — I5032 Chronic diastolic (congestive) heart failure: Secondary | ICD-10-CM

## 2013-04-09 DIAGNOSIS — I509 Heart failure, unspecified: Secondary | ICD-10-CM

## 2013-04-09 DIAGNOSIS — N390 Urinary tract infection, site not specified: Secondary | ICD-10-CM

## 2013-04-09 LAB — GLUCOSE, CAPILLARY
Glucose-Capillary: 110 mg/dL — ABNORMAL HIGH (ref 70–99)
Glucose-Capillary: 155 mg/dL — ABNORMAL HIGH (ref 70–99)

## 2013-04-09 LAB — CBC
HCT: 23.8 % — ABNORMAL LOW (ref 39.0–52.0)
MCV: 84.1 fL (ref 78.0–100.0)
RBC: 2.83 MIL/uL — ABNORMAL LOW (ref 4.22–5.81)
WBC: 7.2 10*3/uL (ref 4.0–10.5)

## 2013-04-09 LAB — BASIC METABOLIC PANEL
BUN: 36 mg/dL — ABNORMAL HIGH (ref 6–23)
CO2: 20 mEq/L (ref 19–32)
Calcium: 8 mg/dL — ABNORMAL LOW (ref 8.4–10.5)
Chloride: 101 mEq/L (ref 96–112)
GFR calc Af Amer: 57 mL/min — ABNORMAL LOW (ref >90)
Sodium: 130 mEq/L — ABNORMAL LOW (ref 135–145)

## 2013-04-09 LAB — PTH, INTACT AND CALCIUM: PTH: 4.5 pg/mL — ABNORMAL LOW (ref 14.0–72.0)

## 2013-04-09 MED ORDER — FUROSEMIDE 20 MG PO TABS: 20.0000 mg | ORAL_TABLET | Freq: Every day | ORAL | Status: DC

## 2013-04-09 MED ORDER — HEPARIN SOD (PORK) LOCK FLUSH 100 UNIT/ML IV SOLN
500.0000 [IU] | Freq: Once | INTRAVENOUS | Status: DC
  Filled 2013-04-09: qty 5

## 2013-04-09 MED ORDER — SODIUM BICARBONATE 650 MG PO TABS: 650.0000 mg | ORAL_TABLET | Freq: Three times a day (TID) | ORAL | Status: DC

## 2013-04-09 MED ORDER — LEVOFLOXACIN 750 MG PO TABS: 750.0000 mg | ORAL_TABLET | Freq: Every day | ORAL | Status: DC

## 2013-04-09 NOTE — Progress Notes (Signed)
Subjective: Interval History: has no complaint of nausea or vomiting. Patient presently denies any difficulty breathing and he doesn't have any diarrhea..  Objective: Vital signs in last 24 hours: Temp:  [98.6 F (37 C)-99.4 F (37.4 C)] 98.6 F (37 C) (09/29 0609) Pulse Rate:  [59-68] 60 (09/29 0609) Resp:  [18-20] 20 (09/29 0609) BP: (130-143)/(54-67) 143/54 mmHg (09/29 0609) SpO2:  [96 %-99 %] 97 % (09/29 0609) Weight change:   Intake/Output from previous day: 09/28 0701 - 09/29 0700 In: 1803.8 [P.O.:360; I.V.:1443.8] Out: 2125 [Urine:2125] Intake/Output this shift:    General appearance: alert, cooperative and no distress Resp: clear to auscultation bilaterally Cardio: regular rate and rhythm, S1, S2 normal, no murmur, click, rub or gallop GI: soft, non-tender; bowel sounds normal; no masses,  no organomegaly Extremities: extremities normal, atraumatic, no cyanosis or edema  Lab Results:  Recent Labs  04/08/13 0546 04/09/13 0534  WBC 6.1 7.2  HGB 8.5* 8.3*  HCT 24.1* 23.8*  PLT 169 158   BMET:   Recent Labs  04/08/13 0546 04/09/13 0534  NA 129* 130*  K 3.5 3.9  CL 100 101  CO2 21 20  GLUCOSE 188* 109*  BUN 49* 36*  CREATININE 1.61* 1.46*  CALCIUM 8.1* 8.0*   No results found for this basename: PTH,  in the last 72 hours Iron Studies:   Recent Labs  04/08/13 0546  IRON 35*  TIBC 173*  FERRITIN 150    Studies/Results: No results found.  I have reviewed the patient's current medications.  Assessment/Plan: Problem #1 acute kidney injury superimposed on chronic his BUN and creatinine presently  improving. The etiology could be prerenal/ATN. Problem #2 chronic renal failure stage III.  Problem #3 hyponatremia possibly hypovolemic hyponatremia sodium 130 improving.  problem #4 hyperkalemia his potassium has improved. Etiology could be secondary to ACE inhibitor/acute kidney injury/interstitial nephritis/Type  4 RTA Problem #5 metabolic acidosis  he CO2 has corrected. On oral sodium bicarbonate Problem #6 diabetes Problem #7 hypertension his blood pressure seems to be reasonably controlled Problem #8 history renal CA Problem #9 history of bladder CA Problem #10 history of prostate CA Problem #11 history of non-Hodgkin's lymphoma.  Problem #12 anemia His iron saturation and ferritin is with in normal range hence seems to be secondary to chronic diseases.. Plan: DC IV fluid           Continue with sodium bicarboante    LOS: 3 days   Frederick Foster S 04/09/2013,8:51 AM

## 2013-04-09 NOTE — Progress Notes (Signed)
UR Chart Review Completed  

## 2013-04-09 NOTE — Discharge Summary (Signed)
Physician Discharge Summary  Frederick Foster AVW:[id number] DOB: [birth year] DOA: 04/06/2013  PCP: Rudi Heap, MD  Admit date: 04/06/2013 Discharge date: 04/09/2013  Time spent: 45 minutes  Recommendations for Outpatient Follow-up:  1. Repeat BMET, CBC in 1 week 2. Follow up with nephrology in 4 weeks 3. Follow up with pcp in 2 weeks  Discharge Diagnoses:  Principal Problem:   Hyperkalemia Active Problems:   HTN (hypertension)   Solitary kidney   Hyponatremia   ARF (acute renal failure)   DM (diabetes mellitus), type 2, uncontrolled   CHB (complete heart block)   Depression with anxiety   Cardiac pacemaker in situ   Metabolic acidosis   Open abdominal wall wound   UTI (urinary tract infection)   Chronic kidney disease, stage III (moderate)   Protein-calorie malnutrition, severe   Discharge Condition: improved  Diet recommendation: low salt, low potassium  Filed Weights   04/06/13 1629 04/06/13 2217  Weight: 108.41 kg (239 lb) 100.971 kg (222 lb 9.6 oz)    History of present illness:  62 year old male with history of multiple medical problems including bladder carcinoma status post cystectomy, prostatitis or status post prostatectomy 1 month ago done at [facility]. Patient has history of multiple cancers including renal cell carcinoma status post nephrectomy, lymphoma. As per patient he has had decreased appetite with nausea but started week ago. Patient says that he is unable to keep anything down and would only eats he vomits. Patient was seen at the PCP office yesterday he was told that her potassium was 7.5 and today he came to recheck the potassium and 86.7 in the ED. Patient does have a history of hyperkalemia C. KD and nephrectomy. Patient has a history of complete heart block and has temporary pacemaker placed at [facility]. Patient is supposed to get permanent defibrillator once the abdominal scar heals. Patient denies any symptoms of chest pain no shortness of breath he  admits to having headache, gait problems with one episode of fall a few days ago. He also admits to having some blurred vision denies any fever. In the ED patient was also found to have non-gap metabolic acidosis with CO2 of 8 and sodium is 123.   Hospital Course:  The patient was admitted for generalized weakness, nausea and vomiting. He was found to be in acute on chronic renal failure, and had significant metabolic abnormalities including severe hyperkalemia, hyponatremia and metabolic acidosis. It was felt that his metabolic acidosis may be related to high output from his ileostomy. The patient was started on a bicarbonate infusion and was followed by nephrology. His metabolic acidosis has since resolved. Renal function has now trended back to baseline, creatinine today is 1.4. He's been started on oral bicarbonate supplements. Lasix and ACE inhibitor were held due to renal dysfunction. We will continue to hold ACE inhibitor and this can be readdressed as an outpatient. Lasix will be restarted 1 week. Hyponatremia has also improved. Clinically, the patient does feel markedly improved. His urine culture was positive for gram-negative rods. He was started empirically on Levaquin and will be discharged on the same. Culture results will be followed up. He does have significant anemia with hemoglobin of 8.5. This appears to be due to chronic disease and he does not have any signs of active bleeding. This will need to be followed up as an outpatient. He was seen by wound care for his chronic abdominal wound, which he will continue treating as an outpatient. Physical therapy also saw the patient and  recommended home health PT. This has been arranged. The patient is not febrile and does not have any signs of ongoing sepsis. He is ready for discharge home with outpatient followup.  Procedures:  none  Consultations:  Nephrology  Discharge Exam: Filed Vitals:   04/09/13 0609  BP: 143/54  Pulse: 60  Temp:  98.6 F (37 C)  Resp: 20    General: NAD Cardiovascular: S1, S2 RRR Respiratory: CTA B  Discharge Instructions  Discharge Orders   Future Appointments Provider Department Dept Phone   05/28/2013 9:15 AM Mary-Margaret Daphine Deutscher, FNP [facility] FAMILY [facility]ICINE [phone]   Future Orders Complete By Expires   Call MD for:  difficulty breathing, headache or visual disturbances  As directed    Call MD for:  temperature >100.4  As directed    Diet - low sodium heart healthy  As directed    Increase activity slowly  As directed        Medication List    STOP taking these medications       lisinopril 40 MG tablet  Commonly known as:  PRINIVIL,ZESTRIL      TAKE these medications       ALPRAZolam 1 MG tablet  Commonly known as:  XANAX  Take 1 mg by mouth at bedtime as needed for sleep or anxiety.     amLODipine 5 MG tablet  Commonly known as:  NORVASC  Take 5 mg by mouth daily.     aspirin EC 81 MG tablet  Take 81 mg by mouth at bedtime.     citalopram 20 MG tablet  Commonly known as:  CELEXA  Take 1 tablet (20 mg total) by mouth daily.     furosemide 20 MG tablet  Commonly known as:  LASIX  Take 1 tablet (20 mg total) by mouth daily. Resume on 04/15/13     glimepiride 4 MG tablet  Commonly known as:  AMARYL  Take 1 tablet (4 mg total) by mouth daily before breakfast.     HYDROcodone-acetaminophen 5-325 MG per tablet  Commonly known as:  NORCO/VICODIN  Take 1 tablet by mouth every 6 (six) hours as needed. For pain     isosorbide mononitrate 30 MG 24 hr tablet  Commonly known as:  IMDUR  Take 30 mg by mouth daily.     levofloxacin 750 MG tablet  Commonly known as:  LEVAQUIN  Take 1 tablet (750 mg total) by mouth daily.     metoprolol tartrate 25 MG tablet  Commonly known as:  LOPRESSOR  Take 25 mg by mouth 2 (two) times daily.     nystatin-triamcinolone cream  Commonly known as:  MYCOLOG II  Apply 1 application topically daily. To apply when  changing dressing     rosuvastatin 5 MG tablet  Commonly known as:  CRESTOR  Take 5 mg by mouth at bedtime.     sodium bicarbonate 650 MG tablet  Take 1 tablet (650 mg total) by mouth 3 (three) times daily.       Allergies  Allergen Reactions  . Penicillins Swelling  . Sulfonamide Derivatives Rash       Follow-up Information   Follow up with [facility] S, MD In 4 weeks.   Specialty:  Nephrology   Contact information:   [address] [place] [address] [phone]       Follow up with Rudi Heap, MD. Schedule an appointment as soon as possible for a visit in 2 weeks.   Specialty:  Family Medicine   Contact information:   [address] [place] [address] [phone]        The results of significant diagnostics from this hospitalization (including imaging, microbiology, ancillary and laboratory) are listed below for reference.    Significant Diagnostic Studies: Dg Chest 2 View  04/06/2013   CLINICAL DATA:  Nausea. Dizziness. Unspecified problems with the indwelling pacemaker. Prior CABG. Current history of non-Hodgkin's lymphoma.  EXAM: CHEST  2 VIEW  COMPARISON:  One view chest x-ray 03/31/2013. Two-view chest x-ray 04/25/2012. PET-CT 08/08/2012.  FINDINGS: Prior sternotomy for CABG. Cardiac silhouette normal in size, unchanged. Thoracic aorta atherosclerotic, unchanged. Hilar and mediastinal contours otherwise unremarkable. Lungs clear. Bronchovascular markings normal. Pulmonary vascularity normal. No visible pleural effusions. No pneumothorax. Degenerative changes involving the lower thoracic spine. Right jugular Port-A-Cath tip in the lower SVC. Left subclavian single lead transvenous pacemaker with the lead tip at the expected location of the RV apex, unchanged.  IMPRESSION: Support apparatus satisfactory. No acute cardiopulmonary disease.   Electronically Signed   By: Hulan Saas   On: 04/06/2013 18:33    Microbiology: Recent Results  (from the past 240 hour(s))  URINE CULTURE     Status: None   Collection Time    04/06/13  6:47 PM      Result Value Range Status   Specimen Description URINE, CLEAN CATCH   Final   Special Requests NONE   Final   Culture  Setup Time     Final   Value: 04/08/2013 04:22     Performed at [facility] Count     Final   Value: >=100,000 COLONIES/ML     Performed at [facility]   Culture     Final   Value: GRAM NEGATIVE RODS     Performed at [facility]   Report Status PENDING   Incomplete     Labs: Basic Metabolic Panel:  Recent Labs Lab 04/06/13 0935 04/06/13 1757 04/07/13 0603 04/08/13 0546 04/09/13 0534  NA 127* 123* 129* 129* 130*  K 6.7* 6.6* 4.8 3.5 3.9  CL 102 103 105 100 101  CO2 8* 8* 13* 21 20  GLUCOSE 132* 166* 164* 188* 109*  BUN 66* 68* 62* 49* 36*  CREATININE 2.16* 2.18* 1.92* 1.61* 1.46*  CALCIUM 9.2 8.9 9.2 8.1* 8.0*  PHOS  --   --   --  2.7  --    Liver Function Tests:  Recent Labs Lab 04/05/13 1520 04/06/13 1757 04/07/13 0603  AST 9 9 12   ALT 7 7 7   ALKPHOS 96 82 87  BILITOT 0.2 0.3 0.3  PROT 6.8 6.7 7.0  ALBUMIN  --  3.1* 3.0*    Recent Labs Lab 04/06/13 1757  LIPASE 22   No results found for this basename: AMMONIA,  in the last 168 hours CBC:  Recent Labs Lab 04/05/13 1520 04/06/13 1757 04/07/13 0603 04/08/13 0546 04/09/13 0534  WBC 12.0* 9.9 8.8 6.1 7.2  NEUTROABS 8.8* 7.6  --   --   --   HGB 11.1* 10.3* 10.2* 8.5* 8.3*  HCT 32.6* 30.0* 30.1* 24.1* 23.8*  MCV 87 86.2 85.3 83.7 84.1  PLT 226 189 216 169 158   Cardiac Enzymes:  Recent Labs Lab 04/06/13 1757  TROPONINI <0.30   BNP: BNP (last 3 results) No results found for this basename: PROBNP,  in the last 8760 hours CBG:  Recent Labs Lab 04/08/13 0754 04/08/13 1119 04/08/13 1619 04/08/13  2151 04/09/13 0823  GLUCAP 165* 197* 124* 141* 110*       Signed:  MEMON,JEHANZEB  [facility] 04/09/2013, 11:36  AM

## 2013-04-09 NOTE — Consult Note (Signed)
WOC consult Note Reason for Consult: evaluate open abdominal wound.  Pt with hx of cystectomy and prostatectomy 1 month ago [facility] per Dr. Earlene Plater, 9 days postoperatively he had wound dehiscence with evisceration at home.  He spent an extended period of time after this in the hospital and has had two area that have failed to heal at this point.  Noted pt has ileal conduit and manages his ostomy pouch independently, offered to order supplies for inpatient stay but at this time he declines.  Wound type: non healing surgical wounds Measurement: proximal wound: 1.0cm x 0.5cm x 0.2cm; distal wound: 3.0cm x 3.5cm x 2.0cm  Wound bed: proximal is clean, pink and somewhat dry.  Distal wound with 10% yellow slough and otherwise pink, but non granular, slick and pale. Drainage (amount, consistency, odor) moderate to heavy serous drainage from the distal wound, the proximal wound did not have a dressing over it. No odor. Periwound:intact Dressing procedure/placement/frequency: silver hydrofiber to absorb excess exudate and manage bioburdan on this wound. Demonstrated wound care to patient, as he has been managing wound care at home. Dry dressing over the proximal wound.  Patient will need script for silver hydrofiber to order from medical supply vendor he currently uses for ostomy supplies.  WOC ostomy consult  Stoma type/location: LLQ, ileal conduit Stomal assessment/size: 1" round Peristomal assessment: pouch intact with pt. Last placement Treatment options for stomal/peristomal skin: none Output yellow urine Ostomy pouching: 1pc.convex in place with belt Education provided:  Offered to order adapter to connect to BSD for nighttime, he is hoping he will be dc today and will not need this.     Discussed POC with patient and bedside nurse.  Re consult if needed, will not follow at this time. Thanks  Huel Centola [employer], CWOCN [phone])

## 2013-04-09 NOTE — Care Management Note (Signed)
    Page 1 of 1   04/09/2013     1:19:07 PM   CARE MANAGEMENT NOTE 04/09/2013  Patient:  Frederick Foster, Frederick Foster   Account Number:  [id number]  Date Initiated:  04/09/2013  Documentation initiated by:  Rosemary Holms  Subjective/Objective Assessment:   Pt lives at home with his spouse. Pt selected [facility] for [facility] RN and PT. Alroy Bailiff notified of referral.     Action/Plan:   Anticipated DC Date:  04/09/2013   Anticipated DC Plan:  HOME W HOME HEALTH SERVICES      DC Planning Services  CM consult      Choice offered to / List presented to:          [facility] arranged  HH-1 RN  HH-10 DISEASE MANAGEMENT  HH-2 PT      [facility] agency  [facility]   Status of service:  Completed, signed off Medicare Important Message given?  YES (If response is "NO", the following Medicare IM given date fields will be blank) Date Medicare IM given:  04/09/2013 Date Additional Medicare IM given:    Discharge Disposition:  HOME W HOME HEALTH SERVICES  Per UR Regulation:    If discussed at Long Length of Stay Meetings, dates discussed:    Comments:  04/09/13 Rosemary Holms RN BSN CM

## 2013-04-09 NOTE — Evaluation (Signed)
Physical Therapy Evaluation Patient Details Name: Frederick Foster MRN: [id number] DOB: 25-Feb-1951 Today's Date: 04/09/2013 Time: 0940-1010 PT Time Calculation (min): 30 min  PT Assessment / Plan / Recommendation History of Present Illness  Pt is a 62 year old male with history of multiple medical problems including bladder carcinoma status post cystectomy, prostatitis or status post prostatectomy 1 month ago done at [facility]. Patient has history of multiple cancers including renal cell carcinoma status post nephrectomy, lymphoma.. Patient was seen at the PCP office was told that his potassium was 7.5 and in the ED it was 86.7. Patient does have a history of hyperkalemia C. KD and nephrectomy. Patient has a history of complete heart block and has temporary pacemaker placed at [facility]. Patient is supposed to get permanent defibrillator once the abdominal scar heals  having some blurred vision denies any fever. In the ED patient was also found to have non-gap metabolic acidosis with CO2 of 8 and sodium is 123.  He had a fall about 1 week prior due to a blackout and sought medical attention at [facility] after this fall and he reports he was not admitted.  He states his potassium was probably off and is why he fell.  He has been treated for currently for hyperkaliema and reports that he is doing much better.  He continues to feel a little weak compared to before and feels he needs to use a RW for safety.   Clinical Impression  Pt is a pleasant 62 year old male referred to PT for mobility with impairments listed below.  At this time pt requires a RW for mobility due to decreased strength and fear of falling after his recent fall at home.  His PLOF is independent with all activities.     PT Assessment  Patient needs continued PT services    Follow Up Recommendations  Home health PT    Does the patient have the potential to tolerate intense rehabilitation      Barriers to Discharge         Equipment Recommendations       Recommendations for Other Services     Frequency Min 3X/week    Precautions / Restrictions Precautions Precautions: Fall   Pertinent Vitals/Pain FACES: 4/10 during gait with RW to Rt rib cage.       Mobility  Bed Mobility Bed Mobility: Supine to Sit;Sitting - Scoot to Edge of Bed Supine to Sit: 7: Independent Sitting - Scoot to Edge of Bed: 7: Independent Transfers Transfers: Sit to Stand;Stand to Sit Sit to Stand: 5: Supervision;With upper extremity assist;From bed;From chair/3-in-1 Stand to Sit: 5: Supervision;To bed;To chair/3-in-1;With upper extremity assist Details for Transfer Assistance: completed 4 times  Ambulation/Gait Ambulation/Gait Assistance: 4: Min guard Ambulation Distance (Feet): 80 Feet Assistive device: Rolling walker Gait Pattern: Step-to pattern;Trunk flexed Gait velocity: decreased Stairs: No    Exercises     PT Diagnosis: Generalized weakness;Acute pain;Difficulty walking  PT Problem List: Decreased balance;Decreased activity tolerance;Decreased mobility PT Treatment Interventions: Gait training;Stair training;Therapeutic activities;Therapeutic exercise;Patient/family education     PT Goals(Current goals can be found in the care plan section) Acute Rehab PT Goals Patient Stated Goal: Go home. PT Goal Formulation: With patient Time For Goal Achievement: 04/16/13 Potential to Achieve Goals: Good  Visit Information  Last PT Received On: 04/09/13 History of Present Illness: 62 year old male with history of multiple medical problems including bladder carcinoma status post cystectomy, prostatitis or status post prostatectomy 1 month ago done at [facility]. Patient has history  of multiple cancers including renal cell carcinoma status post nephrectomy, lymphoma. As per patient he has had decreased appetite with nausea but started week ago. Patient says that he is unable to keep anything down and would only eats he vomits.  Patient was seen at the PCP office yesterday he was told that her potassium was 7.5 and today he came to recheck the potassium and 86.7 in the ED. Patient does have a history of hyperkalemia C. KD and nephrectomy. Patient has a history of complete heart block and has temporary pacemaker placed at [facility]. Patient is supposed to get permanent defibrillator once the abdominal scar heals  having some blurred vision denies any fever. In the ED patient was also found to have non-gap metabolic acidosis with CO2 of 8 and sodium is 123.  He has been treated for currently for hyperkaliema and reports that he is doing much better.  He continues to feel a little weak compared to before and feels he needs to use a RW for safety.        Prior Functioning  Home Living Family/patient expects to be discharged to:: Private residence Living Arrangements: Spouse/significant other;Children Available Help at Discharge: Family Type of Home: House Home Access: Stairs to enter Secretary/administrator of Steps: 1 Entrance Stairs-Rails: None Home Layout: One level Home Equipment: Environmental consultant - 2 wheels;Cane - single point;Hospital bed;Bedside commode;Electric scooter;Shower seat Additional Comments: has a lot of addative equipment from family.  He does not use it currently.  Prior Function Level of Independence: Independent Comments: Disabled  Communication Communication: No difficulties Dominant Hand: Right    Cognition  Cognition Arousal/Alertness: Awake/alert Behavior During Therapy: WFL for tasks assessed/performed Overall Cognitive Status: Within Functional Limits for tasks assessed    Extremity/Trunk Assessment Lower Extremity Assessment Lower Extremity Assessment: Generalized weakness   Balance    End of Session PT - End of Session Equipment Utilized During Treatment: Gait belt Patient left: in chair;with call bell/phone within reach Nurse Communication: Mobility status  GP     Tommey Trudell, MPT,  ATC 04/09/2013, 10:24 AM

## 2013-04-10 LAB — URINE CULTURE: Colony Count: >=100000

## 2013-04-16 ENCOUNTER — Other Ambulatory Visit: Payer: Self-pay | Admitting: Family Medicine

## 2013-04-16 LAB — CBC WITH DIFFERENTIAL/PLATELET
Basophils Absolute: 0 10*3/uL (ref 0.0–0.1)
Basophils Relative: 1 % (ref 0–1)
Eosinophils Absolute: 0.1 10*3/uL (ref 0.0–0.7)
Eosinophils Relative: 2 % (ref 0–5)
HCT: 27.6 % — ABNORMAL LOW (ref 39.0–52.0)
Hemoglobin: 9.1 g/dL — ABNORMAL LOW (ref 13.0–17.0)
MCH: 28.3 pg (ref 26.0–34.0)
MCHC: 33 g/dL (ref 30.0–36.0)
MCV: 85.7 fL (ref 78.0–100.0)
Monocytes Absolute: 0.5 10*3/uL (ref 0.1–1.0)
Monocytes Relative: 10 % (ref 3–12)
Neutro Abs: 2.8 10*3/uL (ref 1.7–7.7)
Neutrophils Relative %: 53 % (ref 43–77)
Platelets: 225 10*3/uL (ref 150–400)
RDW: 17.4 % — ABNORMAL HIGH (ref 11.5–15.5)

## 2013-04-17 LAB — BASIC METABOLIC PANEL
BUN: 27 mg/dL — ABNORMAL HIGH (ref 6–23)
Chloride: 107 mEq/L (ref 96–112)
Creat: 1.25 mg/dL (ref 0.50–1.35)
Glucose, Bld: 93 mg/dL (ref 70–99)
Potassium: 4.3 mEq/L (ref 3.5–5.3)

## 2013-04-19 ENCOUNTER — Encounter (INDEPENDENT_AMBULATORY_CARE_PROVIDER_SITE_OTHER): Payer: Self-pay

## 2013-04-19 ENCOUNTER — Encounter: Payer: Self-pay | Admitting: Nurse Practitioner

## 2013-04-19 ENCOUNTER — Ambulatory Visit (INDEPENDENT_AMBULATORY_CARE_PROVIDER_SITE_OTHER): Payer: Medicare Other | Admitting: Nurse Practitioner

## 2013-04-19 VITALS — BP 143/64 | HR 59 | Temp 97.0°F | Ht 72.0 in | Wt 240.0 lb

## 2013-04-19 DIAGNOSIS — E875 Hyperkalemia: Secondary | ICD-10-CM

## 2013-04-19 DIAGNOSIS — C8589 Other specified types of non-Hodgkin lymphoma, extranodal and solid organ sites: Secondary | ICD-10-CM

## 2013-04-19 DIAGNOSIS — Z09 Encounter for follow-up examination after completed treatment for conditions other than malignant neoplasm: Secondary | ICD-10-CM

## 2013-04-19 NOTE — Progress Notes (Signed)
  Subjective:    Patient ID: SHERWOOD CASTILLA, male    DOB: 02/08/1951, 62 y.o.   MRN: [id number]  HPI  Patient in for follow up today- he was sent to the hospital last week for elevated K+ level- was in hospital for 3 days- Has no complaints- Was put on sodium bicarb TID- He actually feels a lot better. K+ level on 04/17/13 was 4.7- home heath is suppos eto come out 3x a month to recheck K+ level    Review of Systems  Constitutional: Positive for fatigue.  HENT: Negative.   Respiratory: Negative.   Cardiovascular: Negative.   Gastrointestinal: Negative.   Genitourinary: Negative.   Musculoskeletal: Negative.   Neurological: Positive for weakness.       Objective:   Physical Exam  Constitutional: He is oriented to person, place, and time. He appears well-developed and well-nourished.  Cardiovascular: Normal rate and normal heart sounds.   Pulmonary/Chest: Effort normal and breath sounds normal.  Musculoskeletal: Normal range of motion.  Neurological: He is alert and oriented to person, place, and time. He has normal reflexes.  Psychiatric: He has a normal mood and affect. His behavior is normal. Judgment and thought content normal.   BP 143/64  Pulse 59  Temp(Src) 97 F (36.1 C) (Oral)  Ht 6' (1.829 m)  Wt 240 lb (108.863 kg)  BMI 32.54 kg/m2        Assessment & Plan:   1. Hospital discharge follow-up   2. Hyperkalemia      Continue all meds Labs pending Diet and exercise encouraged Health maintenance reviewed Follow up in 3 months and prn  Mary-Margaret Daphine Deutscher, FNP

## 2013-04-19 NOTE — Patient Instructions (Signed)
Hyperkalemia  Hyperkalemia is when you have too much potassium in your blood. This can be a life-threatening condition. Potassium is normally removed (excreted) from the body by the kidneys.  CAUSES   The potassium level in your body can become too high for the following reasons:   You take in too much potassium. You can do this by:   Using salt substitutes. They contain large amounts of potassium.   Taking potassium supplements from your caregiver. The dose may be too high for you.   Eating foods or taking nutritional products with potassium.   You excrete too little potassium. This can happen if:   Your kidneys are not functioning properly. Kidney (renal) disease is a very common cause of hyperkalemia.   You are taking medicines that lower your excretion of potassium, such as certain diuretic medicines.   You have an adrenal gland disease called Addison's disease.   You have a urinary tract obstruction, such as kidney stones.   You are on treatment to mechanically clean your blood (dialysis) and you skip a treatment.   You release a high amount of potassium from your cells into your blood. You may have a condition that causes potassium to move from your cells to your bloodstream. This can happen with:   Injury to muscles or other tissues. Most potassium is stored in the muscles.   Severe burns or infections.   Acidic blood plasma (acidosis). Acidosis can result from many diseases, such as uncontrolled diabetes.  SYMPTOMS   Usually, there are no symptoms unless the potassium is dangerously high or has risen very quickly. Symptoms may include:   Irregular or very slow heartbeat.   Feeling sick to your stomach (nauseous).   Tiredness (fatigue).   Nerve problems such as tingling of the skin, numbness of the hands or feet, weakness, or paralysis.  DIAGNOSIS   A simple blood test can measure the amount of potassium in your body. An electrocardiogram test of the heart can also help make the diagnosis.  The heart may beat dangerously fast or slow down and stop beating with severe hyperkalemia.   TREATMENT   Treatment depends on how bad the condition is and on the underlying cause.   If the hyperkalemia is an emergency (causing heart problems or paralysis), many different medicines can be used alone or together to lower the potassium level briefly. This may include an insulin injection even if you are not diabetic. Emergency dialysis may be needed to remove potassium from the body.   If the hyperkalemia is less severe or dangerous, the underlying cause is treated. This can include taking medicines if needed. Your prescription medicines may be changed. You may also need to take a medicine to help your body get rid of potassium. You may need to eat a diet low in potassium.  HOME CARE INSTRUCTIONS    Take medicines and supplements as directed by your caregiver.   Do not take any over-the-counter medicines, supplements, natural products, herbs, or vitamins without reviewing them with your caregiver. Certain supplements and natural food products can have high amounts of potassium. Other products (such as ibuprofen) can damage weak kidneys and raise your potassium.   You may be asked to do repeat lab tests. Be sure to follow these directions.   If you have kidney disease, you may need to follow a low potassium diet.  SEEK MEDICAL CARE IF:    You notice an irregular or very slow heartbeat.   You feel lightheaded.     You develop weakness that is unusual for you.  SEEK IMMEDIATE MEDICAL CARE IF:    You have shortness of breath.   You have chest discomfort.   You pass out (faint).  MAKE SURE YOU:    Understand these instructions.   Will watch your condition.   Will get help right away if you are not doing well or get worse.  Document Released: 06/18/2002 Document Revised: 09/20/2011 Document Reviewed: 12/03/2010  Exit[employer] Patient Information 2014 [employer].

## 2013-04-23 ENCOUNTER — Other Ambulatory Visit: Payer: Self-pay | Admitting: Family Medicine

## 2013-04-24 LAB — BASIC METABOLIC PANEL
Calcium: 8.3 mg/dL — ABNORMAL LOW (ref 8.4–10.5)
Creat: 1.25 mg/dL (ref 0.50–1.35)
Sodium: 135 mEq/L (ref 135–145)

## 2013-04-24 LAB — CBC WITH DIFFERENTIAL/PLATELET
Basophils Absolute: 0.1 10*3/uL (ref 0.0–0.1)
Eosinophils Absolute: 0.2 10*3/uL (ref 0.0–0.7)
Eosinophils Relative: 3 % (ref 0–5)
HCT: 27.9 % — ABNORMAL LOW (ref 39.0–52.0)
Lymphocytes Relative: 33 % (ref 12–46)
MCH: 28.5 pg (ref 26.0–34.0)
MCV: 87.5 fL (ref 78.0–100.0)
Neutrophils Relative %: 54 % (ref 43–77)
Platelets: 215 10*3/uL (ref 150–400)
RDW: 17.2 % — ABNORMAL HIGH (ref 11.5–15.5)
WBC: 5.4 10*3/uL (ref 4.0–10.5)

## 2013-04-26 ENCOUNTER — Telehealth: Payer: Self-pay | Admitting: *Deleted

## 2013-04-26 DIAGNOSIS — L0591 Pilonidal cyst without abscess: Secondary | ICD-10-CM

## 2013-04-26 NOTE — Telephone Encounter (Signed)
Advanced nurse says he has a pilonidal cyst and needs surgery, we need to do a referral asap

## 2013-04-26 NOTE — Telephone Encounter (Signed)
Referral made 

## 2013-04-27 ENCOUNTER — Encounter (INDEPENDENT_AMBULATORY_CARE_PROVIDER_SITE_OTHER): Payer: Self-pay | Admitting: Surgery

## 2013-04-27 ENCOUNTER — Ambulatory Visit (INDEPENDENT_AMBULATORY_CARE_PROVIDER_SITE_OTHER): Payer: Medicare Other | Admitting: Surgery

## 2013-04-27 VITALS — BP 124/70 | HR 74 | Temp 98.6°F | Resp 18 | Ht 72.0 in | Wt 233.4 lb

## 2013-04-27 DIAGNOSIS — K612 Anorectal abscess: Secondary | ICD-10-CM | POA: Insufficient documentation

## 2013-04-27 MED ORDER — SULFAMETHOXAZOLE-TRIMETHOPRIM 400-80 MG PO TABS: 1.0000 | ORAL_TABLET | Freq: Two times a day (BID) | ORAL | Status: AC

## 2013-04-27 NOTE — Patient Instructions (Signed)
Change dry gauze dressing as needed.  Remove packing from wound in 48 hours.  Begin tub soaks and cover wound with dry gauze dressing after removal of packing.  Complete 5 day course of oral antibiotics.  Velora Heckler, MD, [facility] Office: [phone]

## 2013-04-27 NOTE — Progress Notes (Signed)
General Surgery [facility]  Chief Complaint  Patient presents with  . New Evaluation    pilonidal cyst - referral from Mary-Margaret Daphine Deutscher, FNP    HISTORY: Patient is a 62 year old male referred from his primary care physician's office for evaluation of an abscess on the natal cleft. Patient states he had a similar such problem many years ago. Over the past few weeks he has noted pain and swelling. He was seen at the office of his primary care physician and referred to surgery for evaluation and management.  PERTINENT REVIEW OF SYSTEMS: No drainage. Persistent swelling. Increasing pain.  EXAM: HEENT: normocephalic; pupils equal and reactive; sclerae clear; dentition good; mucous membranes moist NECK:  symmetric on extension; no palpable anterior or posterior cervical lymphadenopathy; no supraclavicular masses; no tenderness CHEST: clear to auscultation bilaterally without rales, rhonchi, or wheezes CARDIAC: regular rate and rhythm without significant murmur; peripheral pulses are full; temporary pacemaker in the left upper chest wall GU:  Examination of the natal cleft shows a indurated erythematous fluctuant region on the anterior medial portion of the left buttock. There are no stigmata of pilonidal cyst disease.  PROCEDURE: Under aseptic conditions using local anesthetic, a 1 cm incision is made in the roof of the abscess cavity. Rocks mainly 20 cc of foul-smelling thick pus is evacuated. Wound is packed with quarter-inch iodoform gauze packing. Dry gauze dressings are placed. Procedure is well tolerated.  IMPRESSION: Perianal abscess  PLAN: Usual instructions for wound care are given. Patient will remove the packing in 48 hours. He will take 5 day course of oral Bactrim.  Patient is scheduled to see his primary care provider in 2 weeks. He will have them check the wound. If there are any problems or complications, he is to contact our office  immediately.  Patient will return for surgical care as needed.  Velora Heckler, MD, [facility] Office: [phone]  Visit Diagnoses: 1. Abscess of anal and rectal regions

## 2013-04-30 ENCOUNTER — Other Ambulatory Visit: Payer: Self-pay | Admitting: Family Medicine

## 2013-04-30 ENCOUNTER — Other Ambulatory Visit: Payer: Self-pay

## 2013-04-30 LAB — CBC WITH DIFFERENTIAL/PLATELET
Eosinophils Absolute: 0.2 10*3/uL (ref 0.0–0.7)
Eosinophils Relative: 4 % (ref 0–5)
HCT: 29.8 % — ABNORMAL LOW (ref 39.0–52.0)
Hemoglobin: 9.7 g/dL — ABNORMAL LOW (ref 13.0–17.0)
Lymphocytes Relative: 43 % (ref 12–46)
Lymphs Abs: 1.9 10*3/uL (ref 0.7–4.0)
MCH: 29 pg (ref 26.0–34.0)
MCV: 89.2 fL (ref 78.0–100.0)
Monocytes Absolute: 0.5 10*3/uL (ref 0.1–1.0)
Monocytes Relative: 11 % (ref 3–12)
RBC: 3.34 MIL/uL — ABNORMAL LOW (ref 4.22–5.81)
RDW: 16.9 % — ABNORMAL HIGH (ref 11.5–15.5)
WBC: 4.5 10*3/uL (ref 4.0–10.5)

## 2013-04-30 MED ORDER — LISINOPRIL 20 MG PO TABS: ORAL_TABLET | ORAL | Status: DC

## 2013-04-30 NOTE — Telephone Encounter (Signed)
Last seen 04/27/13  MMM This med not on EPIC list

## 2013-04-30 NOTE — Telephone Encounter (Signed)
Last seen 04/27/13/  MMM  If approved route to nurse to phone into [facility]

## 2013-05-01 LAB — BASIC METABOLIC PANEL
BUN: 24 mg/dL — ABNORMAL HIGH (ref 6–23)
CO2: 24 mEq/L (ref 19–32)
Chloride: 105 mEq/L (ref 96–112)
Creat: 1.55 mg/dL — ABNORMAL HIGH (ref 0.50–1.35)
Glucose, Bld: 146 mg/dL — ABNORMAL HIGH (ref 70–99)
Sodium: 133 mEq/L — ABNORMAL LOW (ref 135–145)

## 2013-05-08 ENCOUNTER — Other Ambulatory Visit: Payer: Self-pay | Admitting: Family Medicine

## 2013-05-08 LAB — CBC WITH DIFFERENTIAL/PLATELET
Basophils Relative: 1 % (ref 0–1)
Eosinophils Absolute: 0.1 10*3/uL (ref 0.0–0.7)
Eosinophils Relative: 2 % (ref 0–5)
Hemoglobin: 10.9 g/dL — ABNORMAL LOW (ref 13.0–17.0)
Lymphs Abs: 1.9 10*3/uL (ref 0.7–4.0)
MCH: 30.1 pg (ref 26.0–34.0)
MCHC: 33.1 g/dL (ref 30.0–36.0)
MCV: 90.9 fL (ref 78.0–100.0)
Monocytes Absolute: 0.4 10*3/uL (ref 0.1–1.0)
Monocytes Relative: 7 % (ref 3–12)
Neutrophils Relative %: 58 % (ref 43–77)
RBC: 3.62 MIL/uL — ABNORMAL LOW (ref 4.22–5.81)
RDW: 16.4 % — ABNORMAL HIGH (ref 11.5–15.5)

## 2013-05-08 LAB — BASIC METABOLIC PANEL
BUN: 28 mg/dL — ABNORMAL HIGH (ref 6–23)
Calcium: 8.9 mg/dL (ref 8.4–10.5)
Glucose, Bld: 285 mg/dL — ABNORMAL HIGH (ref 70–99)
Sodium: 133 mEq/L — ABNORMAL LOW (ref 135–145)

## 2013-05-11 DIAGNOSIS — N183 Chronic kidney disease, stage 3 (moderate): Secondary | ICD-10-CM

## 2013-05-11 DIAGNOSIS — I129 Hypertensive chronic kidney disease with stage 1 through stage 4 chronic kidney disease, or unspecified chronic kidney disease: Secondary | ICD-10-CM

## 2013-05-11 DIAGNOSIS — E119 Type 2 diabetes mellitus without complications: Secondary | ICD-10-CM

## 2013-05-11 DIAGNOSIS — T8189XA Other complications of procedures, not elsewhere classified, initial encounter: Secondary | ICD-10-CM

## 2013-05-12 HISTORY — PX: PACEMAKER INSERTION: SHX728

## 2013-05-14 ENCOUNTER — Telehealth: Payer: Self-pay | Admitting: Nurse Practitioner

## 2013-05-14 ENCOUNTER — Other Ambulatory Visit: Payer: Self-pay | Admitting: *Deleted

## 2013-05-14 MED ORDER — ALPRAZOLAM 1 MG PO TABS: 1.0000 mg | ORAL_TABLET | Freq: Every evening | ORAL | Status: DC | PRN

## 2013-05-14 MED ORDER — SODIUM BICARBONATE 650 MG PO TABS: 650.0000 mg | ORAL_TABLET | Freq: Three times a day (TID) | ORAL | Status: DC

## 2013-05-14 NOTE — Telephone Encounter (Signed)
Please phone in xanax rx 

## 2013-05-14 NOTE — Telephone Encounter (Signed)
Not sure when last xanax was filled, last seen 04/19/13

## 2013-05-15 NOTE — Telephone Encounter (Signed)
Called in.

## 2013-05-16 NOTE — Telephone Encounter (Signed)
Refill

## 2013-05-16 NOTE — Telephone Encounter (Signed)
Pt not getting potassium per [facility] and xanax was filled.  Left message on pt vm , no potassium script on med list.

## 2013-05-16 NOTE — Telephone Encounter (Addendum)
Xanax was filled 05/14/13-Potassium not on med list- call pharmacy and find out what he takes

## 2013-05-21 DIAGNOSIS — I495 Sick sinus syndrome: Secondary | ICD-10-CM | POA: Insufficient documentation

## 2013-05-28 ENCOUNTER — Encounter (INDEPENDENT_AMBULATORY_CARE_PROVIDER_SITE_OTHER): Payer: Self-pay

## 2013-05-28 ENCOUNTER — Ambulatory Visit (INDEPENDENT_AMBULATORY_CARE_PROVIDER_SITE_OTHER): Payer: Medicare Other | Admitting: Nurse Practitioner

## 2013-05-28 ENCOUNTER — Encounter: Payer: Self-pay | Admitting: Nurse Practitioner

## 2013-05-28 VITALS — BP 137/77 | HR 73 | Temp 97.0°F | Ht 72.0 in | Wt 232.0 lb

## 2013-05-28 DIAGNOSIS — F418 Other specified anxiety disorders: Secondary | ICD-10-CM

## 2013-05-28 DIAGNOSIS — E871 Hypo-osmolality and hyponatremia: Secondary | ICD-10-CM

## 2013-05-28 DIAGNOSIS — E782 Mixed hyperlipidemia: Secondary | ICD-10-CM

## 2013-05-28 DIAGNOSIS — I441 Atrioventricular block, second degree: Secondary | ICD-10-CM

## 2013-05-28 DIAGNOSIS — I5032 Chronic diastolic (congestive) heart failure: Secondary | ICD-10-CM

## 2013-05-28 DIAGNOSIS — IMO0002 Reserved for concepts with insufficient information to code with codable children: Secondary | ICD-10-CM

## 2013-05-28 DIAGNOSIS — Z950 Presence of cardiac pacemaker: Secondary | ICD-10-CM

## 2013-05-28 DIAGNOSIS — F341 Dysthymic disorder: Secondary | ICD-10-CM

## 2013-05-28 DIAGNOSIS — N183 Chronic kidney disease, stage 3 unspecified: Secondary | ICD-10-CM

## 2013-05-28 DIAGNOSIS — E1165 Type 2 diabetes mellitus with hyperglycemia: Secondary | ICD-10-CM

## 2013-05-28 DIAGNOSIS — E875 Hyperkalemia: Secondary | ICD-10-CM

## 2013-05-28 DIAGNOSIS — I509 Heart failure, unspecified: Secondary | ICD-10-CM

## 2013-05-28 DIAGNOSIS — I10 Essential (primary) hypertension: Secondary | ICD-10-CM

## 2013-05-28 DIAGNOSIS — R0989 Other specified symptoms and signs involving the circulatory and respiratory systems: Secondary | ICD-10-CM

## 2013-05-28 LAB — POCT GLYCOSYLATED HEMOGLOBIN (HGB A1C): Hemoglobin A1C: 7.8

## 2013-05-28 NOTE — Progress Notes (Signed)
Subjective:    Patient ID: Frederick Foster, male    DOB: 03/14/1951, 62 y.o.   MRN: [id number]  HPI  Patient in today for follow up of chronic medical problems- he just recently had pacemaker put in an dis doing okay- says that he is a little ore but overall doing ok. Patient has been through so much lately that he has not been checking his blood sugars at home. Patient Active Problem List   Diagnosis Date Noted  . Abscess of anal and rectal regions 04/27/2013  . Protein-calorie malnutrition, severe 04/08/2013  . Cardiac pacemaker in situ 04/07/2013  . Metabolic acidosis 04/07/2013  . Hyperkalemia 04/07/2013  . Open abdominal wall wound 04/07/2013  . UTI (urinary tract infection) 04/07/2013  . Chronic kidney disease, stage III (moderate) 04/07/2013  . Bladder cancer 02/23/2013  . Depression with anxiety 10/23/2012  . Bradycardia 04/26/2012  . Chronic diastolic CHF (congestive heart failure) 04/26/2012  . CKD (chronic kidney disease), stage III 04/26/2012  . CHB (complete heart block) 04/25/2012  . Acute on chronic diastolic heart failure 02/09/2012  . Atrial flutter 11/19/2011  . Lymphoma 06/22/2011  . DM (diabetes mellitus), type 2, uncontrolled 05/17/2011  . Neck swelling 05/16/2011  . Solitary kidney 05/16/2011  . Hyponatremia 05/16/2011  . ARF (acute renal failure) 05/16/2011  . HTN (hypertension) 05/13/2011  . Mixed hyperlipidemia 10/22/2008  . Second degree Mobitz I AV block 10/22/2008  . HERNIA, VENTRAL 10/22/2008  . CAROTID BRUIT 10/22/2008  . CORONARY ARTERY BYPASS GRAFT, TWO VESSEL, HX OF 10/22/2008   Outpatient Encounter Prescriptions as of 05/28/2013  Medication Sig  . ALPRAZolam (XANAX) 1 MG tablet Take 1 tablet (1 mg total) by mouth at bedtime as needed for sleep or anxiety.  Marland Kitchen amLODipine (NORVASC) 5 MG tablet Take 5 mg by mouth daily.  Marland Kitchen aspirin EC 81 MG tablet Take 81 mg by mouth at bedtime.   . citalopram (CELEXA) 20 MG tablet Take 1 tablet (20 mg total) by  mouth daily.  . furosemide (LASIX) 20 MG tablet Take 1 tablet (20 mg total) by mouth daily. Resume on 04/15/13  . glimepiride (AMARYL) 4 MG tablet Take 1 tablet (4 mg total) by mouth daily before breakfast.  . HYDROcodone-acetaminophen (NORCO/VICODIN) 5-325 MG per tablet Take 1 tablet by mouth every 6 (six) hours as needed. For pain  . isosorbide mononitrate (IMDUR) 30 MG 24 hr tablet Take 30 mg by mouth daily.  Marland Kitchen levofloxacin (LEVAQUIN) 750 MG tablet Take 1 tablet (750 mg total) by mouth daily.  Marland Kitchen lisinopril (PRINIVIL,ZESTRIL) 20 MG tablet One tablet daily  . metoprolol tartrate (LOPRESSOR) 25 MG tablet Take 25 mg by mouth 2 (two) times daily.   Marland Kitchen nystatin-triamcinolone (MYCOLOG II) cream Apply 1 application topically daily. To apply when changing dressing  . rosuvastatin (CRESTOR) 5 MG tablet Take 5 mg by mouth at bedtime.   . sodium bicarbonate 650 MG tablet Take 1 tablet (650 mg total) by mouth 3 (three) times daily.  . [DISCONTINUED] ALPRAZolam (XANAX) 0.25 MG tablet Take 1 tablet (0.25 mg total) by mouth daily as needed. For anxiety  . [DISCONTINUED] ALPRAZolam (XANAX) 1 MG tablet Take 1 tablet (1 mg total) by mouth at bedtime as needed. For sleep  . [DISCONTINUED] amLODipine (NORVASC) 5 MG tablet Take 1 tablet (5 mg total) by mouth 2 (two) times daily.  . [DISCONTINUED] ciprofloxacin (CIPRO) 500 MG tablet Take 1 tablet (500 mg total) by mouth 2 (two) times daily.  . [DISCONTINUED] ergocalciferol (VITAMIN  D2) 50000 UNITS capsule Take 50,000 Units by mouth once a week.  . [DISCONTINUED] ferrous sulfate 325 (65 FE) MG tablet Take 650 mg by mouth daily with breakfast.  . [DISCONTINUED] furosemide (LASIX) 20 MG tablet Take 1 tablet (20 mg total) by mouth daily.  . [DISCONTINUED] insulin detemir (LEVEMIR FLEXPEN) 100 UNIT/ML injection Inject 45 Units into the skin 2 (two) times daily.   . [DISCONTINUED] lisinopril (PRINIVIL,ZESTRIL) 40 MG tablet Take 40 mg by mouth daily.  . [DISCONTINUED]  Tamsulosin HCl (FLOMAX) 0.4 MG CAPS Take 0.4 mg by mouth 2 (two) times daily.        Review of Systems     Objective:   Physical Exam  Constitutional: He is oriented to person, place, and time. He appears well-developed and well-nourished.  HENT:  Head: Normocephalic.  Right Ear: External ear normal.  Left Ear: External ear normal.  Nose: Nose normal.  Mouth/Throat: Oropharynx is clear and moist.  Eyes: EOM are normal. Pupils are equal, round, and reactive to light.  Neck: Normal range of motion. Neck supple. No JVD present. No thyromegaly present.  Cardiovascular: Normal rate, regular rhythm, normal heart sounds and intact distal pulses.  Exam reveals no gallop and no friction rub.   No murmur heard. Pulmonary/Chest: Effort normal and breath sounds normal. No respiratory distress. He has no wheezes. He has no rales. He exhibits no tenderness.  Abdominal: Soft. Bowel sounds are normal. He exhibits no mass. There is no tenderness.  Cystostomy intact- clear urine in bag  Genitourinary: Prostate normal and penis normal.  Musculoskeletal: Normal range of motion. He exhibits no edema.  Lymphadenopathy:    He has no cervical adenopathy.  Neurological: He is alert and oriented to person, place, and time. No cranial nerve deficit.  Skin: Skin is warm and dry.  Psychiatric: He has a normal mood and affect. His behavior is normal. Judgment and thought content normal.   BP 137/77  Pulse 73  Temp(Src) 97 F (36.1 C) (Oral)  Ht 6' (1.829 m)  Wt 232 lb (105.235 kg)  BMI 31.46 kg/m2 Results for orders placed in visit on 05/28/13  POCT GLYCOSYLATED HEMOGLOBIN (HGB A1C)      Result Value Range   Hemoglobin A1C 7.8            Assessment & Plan:   1. Mixed hyperlipidemia   2. HTN (hypertension)   3. DM (diabetes mellitus), type 2, uncontrolled   4. Second degree Mobitz I AV block   5. Hyperkalemia   6. Hyponatremia   7. Depression with anxiety   8. Chronic kidney disease,  stage III (moderate)   9. Chronic diastolic CHF (congestive heart failure)   10. CAROTID BRUIT   11. Cardiac pacemaker in situ    Orders Placed This Encounter  Procedures  . NMR, lipoprofile  . CMP14+EGFR  . POCT glycosylated hemoglobin (Hb A1C)   Stricter low carb diet- blood sugar diary Continue all meds Labs pending Diet and exercise encouraged Health maintenance reviewed Follow up in 3 months  Mary-Margaret Daphine Deutscher, FNP

## 2013-05-28 NOTE — Patient Instructions (Signed)

## 2013-05-30 LAB — CMP14+EGFR
Albumin/Globulin Ratio: 1.3 (ref 1.1–2.5)
Albumin: 3.5 g/dL — ABNORMAL LOW (ref 3.6–4.8)
BUN: 35 mg/dL — ABNORMAL HIGH (ref 8–27)
CO2: 18 mmol/L (ref 18–29)
Chloride: 103 mmol/L (ref 97–108)
GFR calc non Af Amer: 44 mL/min/{1.73_m2} — ABNORMAL LOW (ref >59)
Globulin, Total: 2.7 g/dL (ref 1.5–4.5)
Glucose: 251 mg/dL — ABNORMAL HIGH (ref 65–99)
Sodium: 131 mmol/L — ABNORMAL LOW (ref 134–144)
Total Protein: 6.2 g/dL (ref 6.0–8.5)

## 2013-05-30 LAB — NMR, LIPOPROFILE
Cholesterol: 108 mg/dL (ref <200)
HDL Particle Number: 23.7 umol/L — ABNORMAL LOW (ref >=30.5)
LDL Size: 21.1 nm (ref >20.5)
Small LDL Particle Number: 203 nmol/L (ref <=527)

## 2013-06-14 ENCOUNTER — Telehealth: Payer: Self-pay | Admitting: Nurse Practitioner

## 2013-06-14 MED ORDER — ALPRAZOLAM 1 MG PO TABS: 1.0000 mg | ORAL_TABLET | Freq: Every evening | ORAL | Status: DC | PRN

## 2013-06-14 NOTE — Telephone Encounter (Signed)
plese phone in xanax 1mg  1 po qhs #30 1 refill

## 2013-06-14 NOTE — Telephone Encounter (Signed)
patiient aware and rx called into pharmacy

## 2013-06-25 ENCOUNTER — Other Ambulatory Visit: Payer: Self-pay | Admitting: Urology

## 2013-06-25 DIAGNOSIS — C679 Malignant neoplasm of bladder, unspecified: Secondary | ICD-10-CM

## 2013-06-29 ENCOUNTER — Ambulatory Visit
Admission: RE | Admit: 2013-06-29 | Discharge: 2013-06-29 | Disposition: A | Discharge status: Home / Self Care | Payer: Medicare HMO | Source: Ambulatory Visit | Attending: Urology | Admitting: Urology

## 2013-06-29 DIAGNOSIS — C679 Malignant neoplasm of bladder, unspecified: Secondary | ICD-10-CM

## 2013-07-16 ENCOUNTER — Other Ambulatory Visit: Payer: Medicare Other

## 2013-07-17 ENCOUNTER — Other Ambulatory Visit: Payer: Self-pay

## 2013-07-17 MED ORDER — SODIUM BICARBONATE 650 MG PO TABS: 650.0000 mg | ORAL_TABLET | Freq: Three times a day (TID) | ORAL | Status: DC

## 2013-08-08 ENCOUNTER — Ambulatory Visit (INDEPENDENT_AMBULATORY_CARE_PROVIDER_SITE_OTHER): Payer: Medicare HMO | Admitting: Nurse Practitioner

## 2013-08-08 ENCOUNTER — Telehealth: Payer: Self-pay | Admitting: Nurse Practitioner

## 2013-08-08 ENCOUNTER — Encounter: Payer: Self-pay | Admitting: Nurse Practitioner

## 2013-08-08 VITALS — BP 127/70 | HR 97 | Temp 97.8°F | Ht 72.0 in | Wt 236.0 lb

## 2013-08-08 DIAGNOSIS — L0233 Carbuncle of buttock: Secondary | ICD-10-CM

## 2013-08-08 MED ORDER — CIPROFLOXACIN HCL 500 MG PO TABS: 500.0000 mg | ORAL_TABLET | Freq: Two times a day (BID) | ORAL | Status: DC

## 2013-08-08 NOTE — Patient Instructions (Signed)
Abscess An abscess is an infected area that contains a collection of pus and debris.It can occur in almost any part of the body. An abscess is also known as a furuncle or boil. CAUSES  An abscess occurs when tissue gets infected. This can occur from blockage of oil or sweat glands, infection of hair follicles, or a minor injury to the skin. As the body tries to fight the infection, pus collects in the area and creates pressure under the skin. This pressure causes pain. People with weakened immune systems have difficulty fighting infections and get certain abscesses more often.  SYMPTOMS Usually an abscess develops on the skin and becomes a painful mass that is red, warm, and tender. If the abscess forms under the skin, you may feel a moveable soft area under the skin. Some abscesses break open (rupture) on their own, but most will continue to get worse without care. The infection can spread deeper into the body and eventually into the bloodstream, causing you to feel ill.  DIAGNOSIS  Your caregiver will take your medical history and perform a physical exam. A sample of fluid may also be taken from the abscess to determine what is causing your infection. TREATMENT  Your caregiver may prescribe antibiotic medicines to fight the infection. However, taking antibiotics alone usually does not cure an abscess. Your caregiver may need to make a small cut (incision) in the abscess to drain the pus. In some cases, gauze is packed into the abscess to reduce pain and to continue draining the area. HOME CARE INSTRUCTIONS   Only take over-the-counter or prescription medicines for pain, discomfort, or fever as directed by your caregiver.  If you were prescribed antibiotics, take them as directed. Finish them even if you start to feel better.  If gauze is used, follow your caregiver's directions for changing the gauze.  To avoid spreading the infection:  Keep your draining abscess covered with a  bandage.  Wash your hands well.  Do not share personal care items, towels, or whirlpools with others.  Avoid skin contact with others.  Keep your skin and clothes clean around the abscess.  Keep all follow-up appointments as directed by your caregiver. SEEK MEDICAL CARE IF:   You have increased pain, swelling, redness, fluid drainage, or bleeding.  You have muscle aches, chills, or a general ill feeling.  You have a fever. MAKE SURE YOU:   Understand these instructions.  Will watch your condition.  Will get help right away if you are not doing well or get worse. Document Released: 04/07/2005 Document Revised: 12/28/2011 Document Reviewed: 09/10/2011 [employer] Patient Information 2014 [employer].  

## 2013-08-08 NOTE — Progress Notes (Signed)
   Subjective:    Patient ID: Frederick Foster, male    DOB: [birth year], 63 y.o.   MRN: [id number]  HPI  Patient in today c/o cyst at anal cleft- sore to touch no drainage- has hd this in the past.    Review of Systems  Constitutional: Negative.   HENT: Negative.   Respiratory: Negative.   Cardiovascular: Negative.   All other systems reviewed and are negative.       Objective:   Physical Exam  Constitutional: He is oriented to person, place, and time. He appears well-developed and well-nourished.  Cardiovascular: Normal rate, regular rhythm and normal heart sounds.   Pulmonary/Chest: Effort normal and breath sounds normal.  Neurological: He is alert and oriented to person, place, and time.  Skin:  3cm indurated erythematous tender to touch lesion left buttocks  Psychiatric: He has a normal mood and affect. His behavior is normal. Judgment and thought content normal.   BP 127/70  Pulse 97  Temp(Src) 97.8 F (36.6 C) (Oral)  Ht 6' (1.829 m)  Wt 236 lb (107.049 kg)  BMI 32.00 kg/m2        Assessment & Plan:   1. Carbuncle of buttock    Meds ordered this encounter  Medications  . ciprofloxacin (CIPRO) 500 MG tablet    Sig: Take 1 tablet (500 mg total) by mouth 2 (two) times daily.    Dispense:  20 tablet    Refill:  0    Order Specific Question:  Supervising Provider    Answer:  Chipper Herb [[id number]]   Warm soaks If no better in 2-3 days RTO  Keep area clean and dry  Mary-Margaret Hassell Done, FNP

## 2013-08-08 NOTE — Telephone Encounter (Signed)
MADE APPT FOR PT TO SEE MMM 1/28.  RS

## 2013-08-09 ENCOUNTER — Other Ambulatory Visit: Payer: Self-pay | Admitting: *Deleted

## 2013-08-09 MED ORDER — ALPRAZOLAM 1 MG PO TABS: 1.0000 mg | ORAL_TABLET | Freq: Every evening | ORAL | Status: DC | PRN

## 2013-08-09 NOTE — Telephone Encounter (Signed)
Called to [facility] 

## 2013-08-09 NOTE — Telephone Encounter (Signed)
Please call in xanax rx with 1 refill 

## 2013-08-09 NOTE — Telephone Encounter (Signed)
Patient seen in office today. Rx last filled on 07-13-13 for #30. Please advise. If approved please route to Pool B so nurse can phone in to [facility]

## 2013-08-15 ENCOUNTER — Other Ambulatory Visit: Payer: Self-pay | Admitting: *Deleted

## 2013-08-15 DIAGNOSIS — E119 Type 2 diabetes mellitus without complications: Secondary | ICD-10-CM

## 2013-08-15 MED ORDER — GLIMEPIRIDE 4 MG PO TABS: 4.0000 mg | ORAL_TABLET | Freq: Every day | ORAL | Status: DC

## 2013-08-29 ENCOUNTER — Encounter: Payer: Self-pay | Admitting: Nurse Practitioner

## 2013-08-29 ENCOUNTER — Ambulatory Visit (INDEPENDENT_AMBULATORY_CARE_PROVIDER_SITE_OTHER): Payer: Medicare HMO | Admitting: Nurse Practitioner

## 2013-08-29 VITALS — BP 151/78 | HR 78 | Temp 96.6°F | Ht 72.0 in | Wt 242.0 lb

## 2013-08-29 DIAGNOSIS — N183 Chronic kidney disease, stage 3 unspecified: Secondary | ICD-10-CM

## 2013-08-29 DIAGNOSIS — I509 Heart failure, unspecified: Secondary | ICD-10-CM

## 2013-08-29 DIAGNOSIS — F418 Other specified anxiety disorders: Secondary | ICD-10-CM

## 2013-08-29 DIAGNOSIS — IMO0001 Reserved for inherently not codable concepts without codable children: Secondary | ICD-10-CM

## 2013-08-29 DIAGNOSIS — F341 Dysthymic disorder: Secondary | ICD-10-CM

## 2013-08-29 DIAGNOSIS — E871 Hypo-osmolality and hyponatremia: Secondary | ICD-10-CM

## 2013-08-29 DIAGNOSIS — IMO0002 Reserved for concepts with insufficient information to code with codable children: Secondary | ICD-10-CM

## 2013-08-29 DIAGNOSIS — E119 Type 2 diabetes mellitus without complications: Secondary | ICD-10-CM

## 2013-08-29 DIAGNOSIS — E875 Hyperkalemia: Secondary | ICD-10-CM

## 2013-08-29 DIAGNOSIS — E1165 Type 2 diabetes mellitus with hyperglycemia: Secondary | ICD-10-CM

## 2013-08-29 DIAGNOSIS — I10 Essential (primary) hypertension: Secondary | ICD-10-CM

## 2013-08-29 DIAGNOSIS — I5032 Chronic diastolic (congestive) heart failure: Secondary | ICD-10-CM

## 2013-08-29 DIAGNOSIS — Z951 Presence of aortocoronary bypass graft: Secondary | ICD-10-CM

## 2013-08-29 DIAGNOSIS — E782 Mixed hyperlipidemia: Secondary | ICD-10-CM

## 2013-08-29 LAB — POCT GLYCOSYLATED HEMOGLOBIN (HGB A1C): Hemoglobin A1C: 9.7

## 2013-08-29 MED ORDER — CITALOPRAM HYDROBROMIDE 20 MG PO TABS: 20.0000 mg | ORAL_TABLET | Freq: Every day | ORAL | Status: DC

## 2013-08-29 MED ORDER — HYDROCODONE-ACETAMINOPHEN 5-325 MG PO TABS: 1.0000 | ORAL_TABLET | Freq: Four times a day (QID) | ORAL | Status: DC | PRN

## 2013-08-29 MED ORDER — ISOSORBIDE MONONITRATE ER 30 MG PO TB24: 30.0000 mg | ORAL_TABLET | Freq: Every day | ORAL | Status: DC

## 2013-08-29 MED ORDER — GLIMEPIRIDE 4 MG PO TABS: 4.0000 mg | ORAL_TABLET | Freq: Every day | ORAL | Status: DC

## 2013-08-29 MED ORDER — INSULIN DETEMIR 100 UNIT/ML FLEXPEN: 30.0000 [IU] | PEN_INJECTOR | Freq: Every day | SUBCUTANEOUS | Status: DC

## 2013-08-29 MED ORDER — ATORVASTATIN CALCIUM 40 MG PO TABS: 40.0000 mg | ORAL_TABLET | Freq: Every day | ORAL | Status: DC

## 2013-08-29 NOTE — Patient Instructions (Signed)

## 2013-08-29 NOTE — Progress Notes (Signed)
Subjective:    Patient ID: Frederick Foster, male    DOB: 13-Dec-1950, 63 y.o.   MRN: [id number]  HPI  Patient in today for follow up of chronic medical problems- he has been doing well the last several weeks- has appointment with kidney specialist today- He is also a diabetic and his fasting blood sugars are running 130's fasting- hasn't needed to use in sliding scale insulin in several weeks. He feels good today without complaints. Patient Active Problem List   Diagnosis Date Noted  . Abscess of anal and rectal regions 04/27/2013  . Protein-calorie malnutrition, severe 04/08/2013  . Cardiac pacemaker in situ 04/07/2013  . Hyperkalemia 04/07/2013  . Bladder cancer 02/23/2013  . Depression with anxiety 10/23/2012  . Chronic diastolic CHF (congestive heart failure) 04/26/2012  . CKD (chronic kidney disease), stage III 04/26/2012  . CHB (complete heart block) 04/25/2012  . Acute on chronic diastolic heart failure 67/20/9470  . Atrial flutter 11/19/2011  . Lymphoma 06/22/2011  . DM (diabetes mellitus), type 2, uncontrolled 05/17/2011  . Solitary kidney 05/16/2011  . Hyponatremia 05/16/2011  . HTN (hypertension) 05/13/2011  . Mixed hyperlipidemia 10/22/2008  . Second degree Mobitz I AV block 10/22/2008  . HERNIA, VENTRAL 10/22/2008  . CAROTID BRUIT 10/22/2008  . CORONARY ARTERY BYPASS GRAFT, TWO VESSEL, HX OF 10/22/2008   Outpatient Encounter Prescriptions as of 08/29/2013  Medication Sig  . ACCU-CHEK AVIVA PLUS test strip   . ALPRAZolam (XANAX) 1 MG tablet Take 1 tablet (1 mg total) by mouth at bedtime as needed for sleep or anxiety.  Marland Kitchen amLODipine (NORVASC) 5 MG tablet Take 5 mg by mouth daily.  Marland Kitchen aspirin EC 81 MG tablet Take 81 mg by mouth at bedtime.   . ASSURE COMFORT LANCETS 30G MISC   . Blood Glucose Calibration (ACCU-CHEK AVIVA) SOLN   . Blood Glucose Monitoring Suppl (ACCU-CHEK AVIVA PLUS) W/DEVICE KIT   . citalopram (CELEXA) 20 MG tablet Take 1 tablet (20 mg total) by mouth  daily.  . furosemide (LASIX) 20 MG tablet Take 1 tablet (20 mg total) by mouth daily. Resume on 04/15/13  . glimepiride (AMARYL) 4 MG tablet Take 1 tablet (4 mg total) by mouth daily before breakfast.  . HYDROcodone-acetaminophen (NORCO/VICODIN) 5-325 MG per tablet Take 1 tablet by mouth every 6 (six) hours as needed. For pain  . isosorbide mononitrate (IMDUR) 30 MG 24 hr tablet Take 30 mg by mouth daily.  Marland Kitchen lisinopril (PRINIVIL,ZESTRIL) 20 MG tablet One tablet daily  . metoprolol tartrate (LOPRESSOR) 25 MG tablet Take 25 mg by mouth 2 (two) times daily.   . rosuvastatin (CRESTOR) 5 MG tablet Take 5 mg by mouth at bedtime.   . sodium bicarbonate 650 MG tablet Take 1 tablet (650 mg total) by mouth 3 (three) times daily.  . [DISCONTINUED] ciprofloxacin (CIPRO) 500 MG tablet Take 1 tablet (500 mg total) by mouth 2 (two) times daily.       Review of Systems  Constitutional: Negative.   HENT: Negative.   Eyes: Negative.   Respiratory: Negative.   Cardiovascular: Negative.   Musculoskeletal: Negative.   Psychiatric/Behavioral: Negative.   All other systems reviewed and are negative.       Objective:   Physical Exam  Constitutional: He is oriented to person, place, and time. He appears well-developed and well-nourished.  HENT:  Head: Normocephalic.  Right Ear: External ear normal.  Left Ear: External ear normal.  Nose: Nose normal.  Mouth/Throat: Oropharynx is clear and moist.  Eyes: EOM are normal. Pupils are equal, round, and reactive to light.  Neck: Normal range of motion. Neck supple. No JVD present. No thyromegaly present.  Cardiovascular: Normal rate, regular rhythm, normal heart sounds and intact distal pulses.  Exam reveals no gallop and no friction rub.   No murmur heard. Pulmonary/Chest: Effort normal and breath sounds normal. No respiratory distress. He has no wheezes. He has no rales. He exhibits no tenderness.  Abdominal: Soft. Bowel sounds are normal. He exhibits no  mass. There is no tenderness.  cystostomy intact  Genitourinary: Prostate normal and penis normal.  Musculoskeletal: Normal range of motion. He exhibits no edema.  Lymphadenopathy:    He has no cervical adenopathy.  Neurological: He is alert and oriented to person, place, and time. No cranial nerve deficit.  Skin: Skin is warm and dry.  Psychiatric: He has a normal mood and affect. His behavior is normal. Judgment and thought content normal.   BP 151/78  Pulse 78  Temp(Src) 96.6 F (35.9 C) (Oral)  Ht 6' (1.829 m)  Wt 242 lb (109.77 kg)  BMI 32.81 kg/m2 Results for orders placed in visit on 08/29/13  POCT GLYCOSYLATED HEMOGLOBIN (HGB A1C)      Result Value Ref Range   Hemoglobin A1C 9.7%           Assessment & Plan:   1. DM (diabetes mellitus), type 2, uncontrolled   2. HTN (hypertension)   3. Mixed hyperlipidemia   4. Hyponatremia   5. Hyperkalemia   6. Depression with anxiety   7. CORONARY ARTERY BYPASS GRAFT, TWO VESSEL, HX OF   8. CKD (chronic kidney disease), stage III   9. Chronic diastolic CHF (congestive heart failure)   10. Type 2 diabetes mellitus    Orders Placed This Encounter  Procedures  . CMP14+EGFR  . NMR, lipoprofile  . POCT glycosylated hemoglobin (Hb A1C)   Meds ordered this encounter  Medications  . Blood Glucose Calibration (ACCU-CHEK AVIVA) SOLN    Sig:   . Blood Glucose Monitoring Suppl (ACCU-CHEK AVIVA PLUS) W/DEVICE KIT    Sig:   . ACCU-CHEK AVIVA PLUS test strip    Sig:   . ASSURE COMFORT LANCETS 30G MISC    Sig:   . glimepiride (AMARYL) 4 MG tablet    Sig: Take 1 tablet (4 mg total) by mouth daily before breakfast.    Dispense:  30 tablet    Refill:  5    Order Specific Question:  Supervising Provider    Answer:  Chipper Herb [[id number]]  . isosorbide mononitrate (IMDUR) 30 MG 24 hr tablet    Sig: Take 1 tablet (30 mg total) by mouth daily.    Dispense:  30 tablet    Refill:  5    Order Specific Question:  Supervising Provider      Answer:  Chipper Herb [[id number]]  . citalopram (CELEXA) 20 MG tablet    Sig: Take 1 tablet (20 mg total) by mouth daily.    Dispense:  30 tablet    Refill:  5    Order Specific Question:  Supervising Provider    Answer:  Chipper Herb [[id number]]  . HYDROcodone-acetaminophen (NORCO/VICODIN) 5-325 MG per tablet    Sig: Take 1 tablet by mouth every 6 (six) hours as needed. For pain    Dispense:  30 tablet    Refill:  0    Order Specific Question:  Supervising Provider    Answer:  Chipper Herb [  [id number]]  . atorvastatin (LIPITOR) 40 MG tablet    Sig: Take 1 tablet (40 mg total) by mouth daily.    Dispense:  30 tablet    Refill:  5    Order Specific Question:  Supervising Provider    Answer:  Chipper Herb [[id number]]  . Insulin Detemir (LEVEMIR) 100 UNIT/ML Pen    Sig: Inject 30 Units into the skin daily at 10 pm.    Dispense:  15 mL    Refill:  5    Order Specific Question:  Supervising Provider    Answer:  Chipper Herb [[id number]]   Patient put back on levemir at 30 u BID Keep diary of blood sugar Labs pending Health maintenance reviewed Diet and exercise encouraged Continue all meds Follow up  In 3 months   [place], FNP

## 2013-08-30 ENCOUNTER — Telehealth: Payer: Self-pay | Admitting: Nurse Practitioner

## 2013-08-30 LAB — NMR, LIPOPROFILE
CHOLESTEROL: 142 mg/dL (ref <200)
HDL CHOLESTEROL BY NMR: 48 mg/dL (ref >=40)
HDL Particle Number: 26.4 umol/L — ABNORMAL LOW (ref >=30.5)
LDL PARTICLE NUMBER: 808 nmol/L (ref <1000)
LDL Size: 20.9 nm (ref >20.5)
LDLC SERPL CALC-MCNC: 72 mg/dL (ref <100)
LP-IR Score: 44 (ref <=45)
Small LDL Particle Number: 252 nmol/L (ref <=527)
Triglycerides by NMR: 111 mg/dL (ref <150)

## 2013-08-30 LAB — CMP14+EGFR
ALT: 9 IU/L (ref 0–44)
AST: 11 IU/L (ref 0–40)
Albumin/Globulin Ratio: 1.7 (ref 1.1–2.5)
Albumin: 4 g/dL (ref 3.6–4.8)
Alkaline Phosphatase: 83 IU/L (ref 39–117)
BUN/Creatinine Ratio: 21 (ref 10–22)
BUN: 37 mg/dL — AB (ref 8–27)
CHLORIDE: 100 mmol/L (ref 97–108)
CO2: 18 mmol/L (ref 18–29)
CREATININE: 1.77 mg/dL — AB (ref 0.76–1.27)
Calcium: 8.7 mg/dL (ref 8.6–10.2)
GFR calc Af Amer: 46 mL/min/{1.73_m2} — ABNORMAL LOW (ref >59)
GFR calc non Af Amer: 40 mL/min/{1.73_m2} — ABNORMAL LOW (ref >59)
GLUCOSE: 317 mg/dL — AB (ref 65–99)
Globulin, Total: 2.3 g/dL (ref 1.5–4.5)
Potassium: 4.8 mmol/L (ref 3.5–5.2)
Sodium: 131 mmol/L — ABNORMAL LOW (ref 134–144)
Total Bilirubin: 0.4 mg/dL (ref 0.0–1.2)
Total Protein: 6.3 g/dL (ref 6.0–8.5)

## 2013-08-30 NOTE — Telephone Encounter (Signed)
error 

## 2013-09-07 ENCOUNTER — Telehealth: Payer: Self-pay | Admitting: Nurse Practitioner

## 2013-09-07 DIAGNOSIS — E119 Type 2 diabetes mellitus without complications: Secondary | ICD-10-CM

## 2013-09-09 MED ORDER — ATORVASTATIN CALCIUM 40 MG PO TABS: 40.0000 mg | ORAL_TABLET | Freq: Every day | ORAL | Status: DC

## 2013-09-09 MED ORDER — LISINOPRIL 20 MG PO TABS: ORAL_TABLET | ORAL | Status: DC

## 2013-09-09 MED ORDER — CITALOPRAM HYDROBROMIDE 20 MG PO TABS: 20.0000 mg | ORAL_TABLET | Freq: Every day | ORAL | Status: DC

## 2013-09-09 MED ORDER — GLIMEPIRIDE 4 MG PO TABS: 4.0000 mg | ORAL_TABLET | Freq: Every day | ORAL | Status: DC

## 2013-09-09 NOTE — Telephone Encounter (Signed)
done

## 2013-09-12 ENCOUNTER — Other Ambulatory Visit: Payer: Self-pay

## 2013-09-12 DIAGNOSIS — E119 Type 2 diabetes mellitus without complications: Secondary | ICD-10-CM

## 2013-09-12 MED ORDER — ALPRAZOLAM 1 MG PO TABS: 1.0000 mg | ORAL_TABLET | Freq: Every evening | ORAL | Status: DC | PRN

## 2013-09-12 MED ORDER — GLIMEPIRIDE 4 MG PO TABS: 4.0000 mg | ORAL_TABLET | Freq: Every day | ORAL | Status: DC

## 2013-09-12 MED ORDER — ISOSORBIDE MONONITRATE ER 30 MG PO TB24: 30.0000 mg | ORAL_TABLET | Freq: Every day | ORAL | Status: DC

## 2013-09-12 MED ORDER — CITALOPRAM HYDROBROMIDE 20 MG PO TABS: 20.0000 mg | ORAL_TABLET | Freq: Every day | ORAL | Status: DC

## 2013-09-12 MED ORDER — FUROSEMIDE 20 MG PO TABS: 20.0000 mg | ORAL_TABLET | Freq: Every day | ORAL | Status: DC

## 2013-09-12 MED ORDER — ATORVASTATIN CALCIUM 40 MG PO TABS: 40.0000 mg | ORAL_TABLET | Freq: Every day | ORAL | Status: DC

## 2013-09-12 NOTE — Telephone Encounter (Signed)
Please call in xanax with 1p refills 

## 2013-09-12 NOTE — Telephone Encounter (Signed)
Last seen 2/15  MMM This is for mail order

## 2013-09-13 MED ORDER — ALPRAZOLAM 1 MG PO TABS: 1.0000 mg | ORAL_TABLET | Freq: Every evening | ORAL | Status: DC | PRN

## 2013-09-13 NOTE — Telephone Encounter (Signed)
Patient aware to pick up RX 

## 2013-09-13 NOTE — Telephone Encounter (Signed)
rx ready for pickup 

## 2013-09-13 NOTE — Telephone Encounter (Signed)
The [facility]anax is [facility] cant call in please print

## 2013-09-13 NOTE — Addendum Note (Signed)
Addended by: Chevis Pretty on: 09/13/2013 08:26 AM   Modules accepted: Orders

## 2013-11-15 ENCOUNTER — Emergency Department (HOSPITAL_COMMUNITY): Payer: Medicare HMO

## 2013-11-15 ENCOUNTER — Encounter (HOSPITAL_COMMUNITY): Payer: Self-pay | Admitting: Emergency Medicine

## 2013-11-15 ENCOUNTER — Emergency Department (HOSPITAL_COMMUNITY)
Admission: EM | Admit: 2013-11-15 | Discharge: 2013-11-16 | Disposition: A | Discharge status: Home / Self Care | Payer: Medicare HMO | Attending: Emergency Medicine | Admitting: Emergency Medicine

## 2013-11-15 DIAGNOSIS — Z951 Presence of aortocoronary bypass graft: Secondary | ICD-10-CM | POA: Insufficient documentation

## 2013-11-15 DIAGNOSIS — E860 Dehydration: Secondary | ICD-10-CM

## 2013-11-15 DIAGNOSIS — Z85528 Personal history of other malignant neoplasm of kidney: Secondary | ICD-10-CM | POA: Insufficient documentation

## 2013-11-15 DIAGNOSIS — Z794 Long term (current) use of insulin: Secondary | ICD-10-CM | POA: Insufficient documentation

## 2013-11-15 DIAGNOSIS — N183 Chronic kidney disease, stage 3 unspecified: Secondary | ICD-10-CM | POA: Insufficient documentation

## 2013-11-15 DIAGNOSIS — Z87891 Personal history of nicotine dependence: Secondary | ICD-10-CM | POA: Insufficient documentation

## 2013-11-15 DIAGNOSIS — F3289 Other specified depressive episodes: Secondary | ICD-10-CM | POA: Insufficient documentation

## 2013-11-15 DIAGNOSIS — Z8551 Personal history of malignant neoplasm of bladder: Secondary | ICD-10-CM | POA: Insufficient documentation

## 2013-11-15 DIAGNOSIS — F411 Generalized anxiety disorder: Secondary | ICD-10-CM | POA: Insufficient documentation

## 2013-11-15 DIAGNOSIS — Z7982 Long term (current) use of aspirin: Secondary | ICD-10-CM | POA: Insufficient documentation

## 2013-11-15 DIAGNOSIS — I2510 Atherosclerotic heart disease of native coronary artery without angina pectoris: Secondary | ICD-10-CM | POA: Insufficient documentation

## 2013-11-15 DIAGNOSIS — E782 Mixed hyperlipidemia: Secondary | ICD-10-CM | POA: Insufficient documentation

## 2013-11-15 DIAGNOSIS — E119 Type 2 diabetes mellitus without complications: Secondary | ICD-10-CM | POA: Insufficient documentation

## 2013-11-15 DIAGNOSIS — Z880 Allergy status to penicillin: Secondary | ICD-10-CM | POA: Insufficient documentation

## 2013-11-15 DIAGNOSIS — Z8546 Personal history of malignant neoplasm of prostate: Secondary | ICD-10-CM | POA: Insufficient documentation

## 2013-11-15 DIAGNOSIS — Z87898 Personal history of other specified conditions: Secondary | ICD-10-CM | POA: Insufficient documentation

## 2013-11-15 DIAGNOSIS — F329 Major depressive disorder, single episode, unspecified: Secondary | ICD-10-CM | POA: Insufficient documentation

## 2013-11-15 DIAGNOSIS — R55 Syncope and collapse: Secondary | ICD-10-CM

## 2013-11-15 DIAGNOSIS — Z79899 Other long term (current) drug therapy: Secondary | ICD-10-CM | POA: Insufficient documentation

## 2013-11-15 LAB — CBC WITH DIFFERENTIAL/PLATELET
BASOS ABS: 0 10*3/uL (ref 0.0–0.1)
BASOS PCT: 0 % (ref 0–1)
Eosinophils Absolute: 0 10*3/uL (ref 0.0–0.7)
Eosinophils Relative: 1 % (ref 0–5)
HEMATOCRIT: 32.7 % — AB (ref 39.0–52.0)
Hemoglobin: 11.4 g/dL — ABNORMAL LOW (ref 13.0–17.0)
LYMPHS PCT: 16 % (ref 12–46)
Lymphs Abs: 1.2 10*3/uL (ref 0.7–4.0)
MCH: 30.6 pg (ref 26.0–34.0)
MCHC: 34.9 g/dL (ref 30.0–36.0)
MCV: 87.9 fL (ref 78.0–100.0)
MONO ABS: 0.6 10*3/uL (ref 0.1–1.0)
Monocytes Relative: 8 % (ref 3–12)
NEUTROS ABS: 5.7 10*3/uL (ref 1.7–7.7)
NEUTROS PCT: 75 % (ref 43–77)
Platelets: 161 10*3/uL (ref 150–400)
RBC: 3.72 MIL/uL — ABNORMAL LOW (ref 4.22–5.81)
RDW: 14.1 % (ref 11.5–15.5)
WBC: 7.5 10*3/uL (ref 4.0–10.5)

## 2013-11-15 LAB — COMPREHENSIVE METABOLIC PANEL
ALK PHOS: 90 U/L (ref 39–117)
ALT: 10 U/L (ref 0–53)
AST: 11 U/L (ref 0–37)
Albumin: 3.3 g/dL — ABNORMAL LOW (ref 3.5–5.2)
BUN: 47 mg/dL — AB (ref 6–23)
CO2: 14 meq/L — AB (ref 19–32)
Calcium: 8.9 mg/dL (ref 8.4–10.5)
Chloride: 100 mEq/L (ref 96–112)
Creatinine, Ser: 1.97 mg/dL — ABNORMAL HIGH (ref 0.50–1.35)
GFR, EST AFRICAN AMERICAN: 40 mL/min — AB (ref >90)
GFR, EST NON AFRICAN AMERICAN: 34 mL/min — AB (ref >90)
GLUCOSE: 294 mg/dL — AB (ref 70–99)
Potassium: 5 mEq/L (ref 3.7–5.3)
Sodium: 127 mEq/L — ABNORMAL LOW (ref 137–147)
Total Bilirubin: 0.6 mg/dL (ref 0.3–1.2)
Total Protein: 6.9 g/dL (ref 6.0–8.3)

## 2013-11-15 LAB — LACTIC ACID, PLASMA: Lactic Acid, Venous: 1.3 mmol/L (ref 0.5–2.2)

## 2013-11-15 LAB — TROPONIN I

## 2013-11-15 MED ORDER — SODIUM CHLORIDE 0.9 % IV BOLUS (SEPSIS)
1000.0000 mL | Freq: Once | INTRAVENOUS | Status: AC
  Administered 2013-11-15: 1000 mL via INTRAVENOUS

## 2013-11-15 NOTE — ED Notes (Signed)
Syncopal episode while at church service, hasn't eaten today. NIDDM. No injury from fall.

## 2013-11-15 NOTE — Discharge Instructions (Signed)
As discussed, your evaluation suggests that dehydration contributed to your episode this evening.  Is here for that followup with your primary care physician for repeat check early next week.  Return here for any concerning changes in the interim.   Dehydration, Adult Dehydration means your body does not have as much fluid as it needs. Your kidneys, brain, and heart will not work properly without the right amount of fluids and salt.  HOME CARE  Ask your doctor how to replace body fluid losses (rehydrate).  Drink enough fluids to keep your pee (urine) clear or pale yellow.  Drink small amounts of fluids often if you feel sick to your stomach (nauseous) or throw up (vomit).  Eat like you normally do.  Avoid:  Foods or drinks high in sugar.  Bubbly (carbonated) drinks.  Juice.  Very hot or cold fluids.  Drinks with caffeine.  Fatty, greasy foods.  Alcohol.  Tobacco.  Eating too much.  Gelatin desserts.  Wash your hands to avoid spreading germs (bacteria, viruses).  Only take medicine as told by your doctor.  Keep all doctor visits as told. GET HELP RIGHT AWAY IF:   You cannot drink something without throwing up.  You get worse even with treatment.  Your vomit has blood in it or looks greenish.  Your poop (stool) has blood in it or looks black and tarry.  You have not peed in 6 to 8 hours.  You pee a small amount of very dark pee.  You have a fever.  You pass out (faint).  You have belly (abdominal) pain that gets worse or stays in one spot (localizes).  You have a rash, stiff neck, or bad headache.  You get easily annoyed, sleepy, or are hard to wake up.  You feel weak, dizzy, or very thirsty. MAKE SURE YOU:   Understand these instructions.  Will watch your condition.  Will get help right away if you are not doing well or get worse. Document Released: 04/24/2009 Document Revised: 09/20/2011 Document Reviewed: 02/15/2011 [facility] Patient  Information 2014 [place], [place].

## 2013-11-15 NOTE — ED Provider Notes (Signed)
CSN: [id number]     Arrival date & time 11/15/13  2123 History  This chart was scribed for Carmin Muskrat, MD by Rolanda Lundborg, ED Scribe. This patient was seen in room APA07/APA07 and the patient's care was started at 9:26 PM.    Chief Complaint  Patient presents with  . Near Syncope   The history is provided by the patient. No language interpreter was used.   HPI Comments: Frederick Foster is a 63 y.o. male with a h/o multiple different primary cancers,  pacemaker, CAD, CABG, Afib, and kidney disease who presents to the Emergency Department complaining of a witnessed (near) syncopal episode and subsequent fall today in the parking lot after a church service. Wife and daughter at bedside were both witnesses. Daughter states he asked to sit down, leaned up against a car, then fell. She denies the pt hitting his head or LOC. She states he appeared diaphoretic shortly before the fall. The episode lasted five minutes. Pt states he does not remember falling. No postictal state. He denies CP headache abdominal pain when he woke up. He reports prior h/o syncope once when he had elevated potassium. He reports decreased urine output and dark-colored urine. He reports feeling sick the last few days including lack of appetite, sinus drainage, dry cough, and nausea but no vomiting. He denies leg swelling, rash.    Past Medical History  Diagnosis Date  . Mixed hyperlipidemia   . 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 HERNIA, VENTRAL   . CAROTID BRUIT   . Coronary artery disease     h/o MI and bypass surgery x 3 in 2751 complicated by sternal wound infection, sees Dr. Johnsie Cancel  . Vein symptom     injury to left leg due to conveyor belt.  caused injury to veins in left leg/ s/p surgery   . Sinus congestion     has allergies  . AODM   . CKD (chronic kidney disease), stage III     has one kidney.  had left kidney removed in 2011 for cancer  .  Hiatal hernia     s/p hiatal hernia surgery 2010  . Anxiety     history of panic attacks  . Depression     takes celexa  . Atrial flutter 11/19/2011  . Atrial fibrillation   . Cancer      kidney ca, also mass to left neck  . Lymphoma   . Bladder cancer   . Prostate cancer   . Non Hodgkin's lymphoma   . Open abdominal wall wound   . Chronic kidney disease, stage III (moderate)   . Acute renal failure 04/06/2013    With hyperkalemia and met acidosis   Past Surgical History  Procedure Laterality Date  . Cholecystectomy    . Kidney surgery    . Hernia repair      2010  . Coronary artery bypass graft      x3 .  Dr. Florence Canner is present cardiologist  . Vein surgery      s/p injury to left leg from conveyor belt  . Cardiac catheterization      2009  . Portacath placement    . Tee without cardioversion  01/07/2012    Procedure: TRANSESOPHAGEAL ECHOCARDIOGRAM (TEE);  Surgeon: Fay Records, MD;  Location: [facility] ENDOSCOPY;  Service: Cardiovascular;  Laterality: N/A;  . Atrial flutter ablation  01/07/12  CTI ablation by Dr Rayann Heman  . Bladder surgery    . Prostate surgery    . Temporary pacemaker insertion     No family history on file. History  Substance Use Topics  . Smoking status: Former Smoker -- 1.00 packs/day for 40 years    Types: Cigarettes    Quit date: 11/22/2009  . Smokeless tobacco: Never Used  . Alcohol Use: No     Comment: has not drank since 1991    Review of Systems  Constitutional:       Per HPI, otherwise negative  HENT:       Per HPI, otherwise negative  Respiratory:       Per HPI, otherwise negative  Cardiovascular:       Per HPI, otherwise negative  Gastrointestinal: Negative for vomiting.  Endocrine:       Negative aside from HPI  Genitourinary:       Neg aside from HPI   Musculoskeletal:       Per HPI, otherwise negative  Skin: Negative.   Neurological: Positive for syncope.      Allergies  Penicillins and Sulfonamide derivatives  Home  Medications   Prior to Admission medications   Medication Sig Start Date End Date Taking? Authorizing Provider  ACCU-CHEK AVIVA PLUS test strip  08/22/13   Historical Provider, MD  ALPRAZolam Duanne Moron) 1 MG tablet Take 1 tablet (1 mg total) by mouth at bedtime as needed for sleep or anxiety. 09/13/13   Mary-Margaret Hassell Done, FNP  amLODipine (NORVASC) 5 MG tablet Take 5 mg by mouth daily. 11/13/12   Josue Hector, MD  aspirin EC 81 MG tablet Take 81 mg by mouth at bedtime.     Historical Provider, MD  ASSURE COMFORT LANCETS 30G [place]  08/22/13   Historical Provider, MD  atorvastatin (LIPITOR) 40 MG tablet Take 1 tablet (40 mg total) by mouth daily. 09/12/13   Mary-Margaret Hassell Done, FNP  Blood Glucose Calibration (ACCU-CHEK AVIVA) SOLN  08/22/13   Historical Provider, MD  Blood Glucose Monitoring Suppl (ACCU-CHEK AVIVA PLUS) W/DEVICE KIT  08/22/13   Historical Provider, MD  citalopram (CELEXA) 20 MG tablet Take 1 tablet (20 mg total) by mouth daily. 09/12/13   Mary-Margaret Hassell Done, FNP  furosemide (LASIX) 20 MG tablet Take 1 tablet (20 mg total) by mouth daily. Resume on 04/15/13 09/12/13   Mary-Margaret Hassell Done, FNP  glimepiride (AMARYL) 4 MG tablet Take 1 tablet (4 mg total) by mouth daily before breakfast. 09/12/13   Mary-Margaret Hassell Done, FNP  HYDROcodone-acetaminophen (NORCO/VICODIN) 5-325 MG per tablet Take 1 tablet by mouth every 6 (six) hours as needed. For pain 08/29/13   Mary-Margaret Hassell Done, FNP  Insulin Detemir (LEVEMIR) 100 UNIT/ML Pen Inject 30 Units into the skin daily at 10 pm. 08/29/13   Mary-Margaret Hassell Done, FNP  isosorbide mononitrate (IMDUR) 30 MG 24 hr tablet Take 1 tablet (30 mg total) by mouth daily. 09/12/13   Mary-Margaret Hassell Done, FNP  lisinopril (PRINIVIL,ZESTRIL) 20 MG tablet One tablet daily 09/09/13   Mary-Margaret Hassell Done, FNP  metoprolol tartrate (LOPRESSOR) 25 MG tablet Take 25 mg by mouth 2 (two) times daily.  03/17/13   Historical Provider, MD  sodium bicarbonate 650 MG tablet Take 1 tablet (650  mg total) by mouth 3 (three) times daily. 07/17/13   Mary-Margaret Hassell Done, FNP   BP 104/58  Pulse 82  Temp(Src) 98.2 F (36.8 C) (Oral)  Resp 16  Ht 6' (1.829 m)  Wt 240 lb (108.863 kg)  BMI 32.54 kg/m2 Physical Exam  Nursing note and vitals reviewed. Constitutional: He is oriented to person, place, and time. He appears well-developed. No distress.  HENT:  Head: Normocephalic and atraumatic.  Eyes: Conjunctivae and EOM are normal.  Cardiovascular: Normal rate and regular rhythm.   Pulmonary/Chest: Effort normal. No stridor. No respiratory distress.    Abdominal: He exhibits no distension.  Musculoskeletal: He exhibits no edema.  Neurological: He is alert and oriented to person, place, and time.  Skin: Skin is warm and dry.  Psychiatric: He has a normal mood and affect.    ED Course  Procedures (including critical care time) Medications  sodium chloride 0.9 % bolus 1,000 mL (not administered)    DIAGNOSTIC STUDIES:   COORDINATION OF CARE: 10:02 PM- Discussed treatment plan with pt which includes IV fluids, CXR, troponin, blood work, EKG. Pt agrees to plan.    Labs Review Labs Reviewed  COMPREHENSIVE METABOLIC PANEL - Abnormal; Notable for the following:    Sodium 127 (*)    CO2 14 (*)    Glucose, Bld 294 (*)    BUN 47 (*)    Creatinine, Ser 1.97 (*)    Albumin 3.3 (*)    GFR calc non Af Amer 34 (*)    GFR calc Af Amer 40 (*)    All other components within normal limits  CBC WITH DIFFERENTIAL - Abnormal; Notable for the following:    RBC 3.72 (*)    Hemoglobin 11.4 (*)    HCT 32.7 (*)    All other components within normal limits  LACTIC ACID, PLASMA  TROPONIN I    Imaging Review Dg Chest 2 View  11/15/2013   CLINICAL DATA:  Chest discomfort.  Lymphoma.  EXAM: CHEST  2 VIEW  COMPARISON:  04/06/2013  FINDINGS: Right IJ porta catheter, catheter orientation unchanged. Dual-chamber right approach pacer, lead similar to previous. Remote median sternotomy for CABG.  Stable, mild cardiomegaly.  No edema, consolidation, effusion, or pneumothorax. Pulmonary hyperinflation, suspect COPD.  IMPRESSION: 1. No active cardiopulmonary disease. 2. Chronic findings are noted above.   Electronically Signed   By: Jorje Guild M.D.   On: 11/15/2013 23:01     EKG Interpretation   Date/Time:  Thursday Nov 15 2013 21:53:02 EDT Ventricular Rate:  74 PR Interval:  209 QRS Duration: 194 QT Interval:  451 QTC Calculation: 500 R Axis:   -65 Text Interpretation:  Sinus rhythm Left bundle branch block Inferior  infarct, acute (RCA) Probable RV involvement, suggest recording right  precordial leads Baseline wander in lead(s) II III aVR aVL aVF V2 Artifact  Ventricular-paced rhythm Abnormal ekg Confirmed by Carmin Muskrat  MD  [phone]) on 11/15/2013 11:12:15 PM     On repeat exam the patient states that he feels better. He has nearly completed a liter of fluid. We discussed all results, including trended towards evidence of dehydration. [name] pacemaker seems to be firing, with cardiac monitor showing rate 75, regular, abnormal given the intraventricular conduction delay. Pulse oximetry 100% on room air normal  MDM   I personally performed the services described in this documentation, which was scribed in my presence. The recorded information has been reviewed and is accurate.   This patient with multiple medical problems presents after a near syncope episode. 100 my exam the patient is awake, alert, hemodynamically stable, neurologically intact. [name] labs all point towards dehydration.  The patient's pacemaker seems to be firing appropriately, there is no evidence of ongoing ischemia, nor any suggestion of occult infection. : Resuscitation with  fluids, the patient felt substantially better, had no dysrhythmia, or evidence of decompensation, and was discharged in stable condition to follow up with cardiology, primary care.    Carmin Muskrat, MD 11/15/13  2355

## 2013-11-19 ENCOUNTER — Telehealth: Payer: Self-pay | Admitting: Nurse Practitioner

## 2013-11-19 NOTE — Telephone Encounter (Signed)
appt given for the am with mmm

## 2013-11-20 ENCOUNTER — Encounter: Payer: Self-pay | Admitting: Nurse Practitioner

## 2013-11-20 ENCOUNTER — Ambulatory Visit (INDEPENDENT_AMBULATORY_CARE_PROVIDER_SITE_OTHER): Payer: Medicare HMO | Admitting: Nurse Practitioner

## 2013-11-20 VITALS — BP 119/66 | HR 63 | Temp 97.8°F | Wt 236.0 lb

## 2013-11-20 DIAGNOSIS — Z09 Encounter for follow-up examination after completed treatment for conditions other than malignant neoplasm: Secondary | ICD-10-CM

## 2013-11-20 DIAGNOSIS — Z8639 Personal history of other endocrine, nutritional and metabolic disease: Secondary | ICD-10-CM

## 2013-11-20 DIAGNOSIS — R55 Syncope and collapse: Secondary | ICD-10-CM

## 2013-11-20 DIAGNOSIS — Z862 Personal history of diseases of the blood and blood-forming organs and certain disorders involving the immune mechanism: Secondary | ICD-10-CM

## 2013-11-20 NOTE — Patient Instructions (Signed)
Dehydration, Adult Dehydration is when you lose more fluids from the body than you take in. Vital organs like the kidneys, brain, and heart cannot function without a proper amount of fluids and salt. Any loss of fluids from the body can cause dehydration.  CAUSES   Vomiting.  Diarrhea.  Excessive sweating.  Excessive urine output.  Fever. SYMPTOMS  Mild dehydration  Thirst.  Dry lips.  Slightly dry mouth. Moderate dehydration  Very dry mouth.  Sunken eyes.  Skin does not bounce back quickly when lightly pinched and released.  Dark urine and decreased urine production.  Decreased tear production.  Headache. Severe dehydration  Very dry mouth.  Extreme thirst.  Rapid, weak pulse (more than 100 beats per minute at rest).  Cold hands and feet.  Not able to sweat in spite of heat and temperature.  Rapid breathing.  Blue lips.  Confusion and lethargy.  Difficulty being awakened.  Minimal urine production.  No tears. DIAGNOSIS  Your caregiver will diagnose dehydration based on your symptoms and your exam. Blood and urine tests will help confirm the diagnosis. The diagnostic evaluation should also identify the cause of dehydration. TREATMENT  Treatment of mild or moderate dehydration can often be done at home by increasing the amount of fluids that you drink. It is best to drink small amounts of fluid more often. Drinking too much at one time can make vomiting worse. Refer to the home care instructions below. Severe dehydration needs to be treated at the hospital where you will probably be given intravenous (IV) fluids that contain water and electrolytes. HOME CARE INSTRUCTIONS   Ask your caregiver about specific rehydration instructions.  Drink enough fluids to keep your urine clear or pale yellow.  Drink small amounts frequently if you have nausea and vomiting.  Eat as you normally do.  Avoid:  Foods or drinks high in sugar.  Carbonated  drinks.  Juice.  Extremely hot or cold fluids.  Drinks with caffeine.  Fatty, greasy foods.  Alcohol.  Tobacco.  Overeating.  Gelatin desserts.  Wash your hands well to avoid spreading bacteria and viruses.  Only take over-the-counter or prescription medicines for pain, discomfort, or fever as directed by your caregiver.  Ask your caregiver if you should continue all prescribed and over-the-counter medicines.  Keep all follow-up appointments with your caregiver. SEEK MEDICAL CARE IF:  You have abdominal pain and it increases or stays in one area (localizes).  You have a rash, stiff neck, or severe headache.  You are irritable, sleepy, or difficult to awaken.  You are weak, dizzy, or extremely thirsty. SEEK IMMEDIATE MEDICAL CARE IF:   You are unable to keep fluids down or you get worse despite treatment.  You have frequent episodes of vomiting or diarrhea.  You have blood or green matter (bile) in your vomit.  You have blood in your stool or your stool looks black and tarry.  You have not urinated in 6 to 8 hours, or you have only urinated a small amount of very dark urine.  You have a fever.  You faint. MAKE SURE YOU:   Understand these instructions.  Will watch your condition.  Will get help right away if you are not doing well or get worse. Document Released: 06/28/2005 Document Revised: 09/20/2011 Document Reviewed: 02/15/2011 [employer] Patient Information 2014 [employer].  

## 2013-11-20 NOTE — Progress Notes (Signed)
Subjective:    Patient ID: Frederick Foster, male    DOB: 1951/06/23, 63 y.o.   MRN: [id number]  HPI Patient inis in today fir follow up of chronic medical problems- He has an extensive list of problems and has been in and our of the hospital over the last year. Was in hospital last week with a syncopial episode and they diagnosed hi with dehydration. He says he is some better. Just feels weak but that is nothing unusual for him.  Patient Active Problem List   Diagnosis Date Noted  . Sick sinus syndrome 05/21/2013  . Abscess of anal and rectal regions 04/27/2013  . Protein-calorie malnutrition, severe 04/08/2013  . Cardiac pacemaker in situ 04/07/2013  . Hyperkalemia 04/07/2013  . Bladder cancer 02/23/2013  . Impaired renal function 01/17/2013  . Arteriosclerosis of coronary artery 01/15/2013  . Depression with anxiety 10/23/2012  . Chronic diastolic CHF (congestive heart failure) 04/26/2012  . CKD (chronic kidney disease), stage III 04/26/2012  . CHB (complete heart block) 04/25/2012  . Acute on chronic diastolic heart failure 85/27/7824  . Atrial flutter 11/19/2011  . Lymphoma 06/22/2011  . DM (diabetes mellitus), type 2, uncontrolled 05/17/2011  . Solitary kidney 05/16/2011  . Hyponatremia 05/16/2011  . HTN (hypertension) 05/13/2011  . Mixed hyperlipidemia 10/22/2008  . Second degree Mobitz I AV block 10/22/2008  . HERNIA, VENTRAL 10/22/2008  . CAROTID BRUIT 10/22/2008  . CORONARY ARTERY BYPASS GRAFT, TWO VESSEL, HX OF 10/22/2008   Outpatient Encounter Prescriptions as of 11/20/2013  Medication Sig  . ALPRAZolam (XANAX) 1 MG tablet Take 1 mg by mouth at bedtime.  Marland Kitchen amLODipine (NORVASC) 5 MG tablet Take 5 mg by mouth every morning.   Marland Kitchen aspirin 81 MG tablet   . aspirin EC 81 MG tablet Take 81 mg by mouth every morning.   . ASSURE COMFORT LANCETS 30G MISC   . atorvastatin (LIPITOR) 40 MG tablet Take 40 mg by mouth every morning.  . citalopram (CELEXA) 20 MG tablet Take 20 mg  by mouth every evening.  . furosemide (LASIX) 20 MG tablet Take 20 mg by mouth every morning.  Marland Kitchen glimepiride (AMARYL) 4 MG tablet Take 1 tablet (4 mg total) by mouth daily before breakfast.  . HYDROcodone-acetaminophen (NORCO/VICODIN) 5-325 MG per tablet Take 1 tablet by mouth every 6 (six) hours as needed. For pain  . isosorbide mononitrate (IMDUR) 30 MG 24 hr tablet Take 1 tablet (30 mg total) by mouth daily.  Marland Kitchen lisinopril (PRINIVIL,ZESTRIL) 20 MG tablet Take 20 mg by mouth every morning.  . metoprolol tartrate (LOPRESSOR) 25 MG tablet Take 25 mg by mouth every morning.   . sodium bicarbonate 650 MG tablet Take 1,300 mg by mouth 3 (three) times daily.      Review of Systems  Constitutional: Negative.   HENT: Negative.   Respiratory: Positive for shortness of breath.   Cardiovascular: Positive for palpitations. Negative for chest pain.  Gastrointestinal: Negative.   Genitourinary: Negative.   Neurological: Negative.   Psychiatric/Behavioral: Negative.   All other systems reviewed and are negative.      Objective:   Physical Exam  Constitutional: He is oriented to person, place, and time. He appears well-developed and well-nourished.  HENT:  Right Ear: External ear normal.  Left Ear: External ear normal.  Nose: Nose normal.  Mouth/Throat: Oropharynx is clear and moist.  Eyes: Pupils are equal, round, and reactive to light.  Neck: Normal range of motion. Neck supple.  Cardiovascular: Normal rate, regular  rhythm and normal heart sounds.   Pulmonary/Chest: Effort normal and breath sounds normal.  Abdominal: Soft. Bowel sounds are normal.  Musculoskeletal: Normal range of motion.  Lymphadenopathy:    He has no cervical adenopathy.  Neurological: He is alert and oriented to person, place, and time.  Skin: Skin is warm and dry.  Psychiatric: He has a normal mood and affect. His behavior is normal. Judgment and thought content normal.   BP 119/66  Pulse 63  Temp(Src) 97.8 F  (36.6 C) (Oral)  Wt 236 lb (107.049 kg)        Assessment & Plan:   1. Hospital discharge follow-up   2. Syncope, non cardiac   3. H/O dehydration    Continue all meds RTO for regular follow up reveiwed hospital records  [place], [place]

## 2013-11-27 ENCOUNTER — Encounter: Payer: Self-pay | Admitting: Nurse Practitioner

## 2013-11-27 ENCOUNTER — Ambulatory Visit (INDEPENDENT_AMBULATORY_CARE_PROVIDER_SITE_OTHER): Payer: Medicare HMO | Admitting: Nurse Practitioner

## 2013-11-27 VITALS — BP 132/68 | HR 82 | Temp 98.3°F | Ht 72.0 in | Wt 236.0 lb

## 2013-11-27 DIAGNOSIS — N183 Chronic kidney disease, stage 3 unspecified: Secondary | ICD-10-CM

## 2013-11-27 DIAGNOSIS — M542 Cervicalgia: Secondary | ICD-10-CM

## 2013-11-27 DIAGNOSIS — I509 Heart failure, unspecified: Secondary | ICD-10-CM

## 2013-11-27 DIAGNOSIS — E782 Mixed hyperlipidemia: Secondary | ICD-10-CM

## 2013-11-27 DIAGNOSIS — I5032 Chronic diastolic (congestive) heart failure: Secondary | ICD-10-CM

## 2013-11-27 DIAGNOSIS — E875 Hyperkalemia: Secondary | ICD-10-CM

## 2013-11-27 DIAGNOSIS — I10 Essential (primary) hypertension: Secondary | ICD-10-CM

## 2013-11-27 DIAGNOSIS — IMO0002 Reserved for concepts with insufficient information to code with codable children: Secondary | ICD-10-CM

## 2013-11-27 DIAGNOSIS — IMO0001 Reserved for inherently not codable concepts without codable children: Secondary | ICD-10-CM

## 2013-11-27 DIAGNOSIS — E1165 Type 2 diabetes mellitus with hyperglycemia: Secondary | ICD-10-CM

## 2013-11-27 DIAGNOSIS — R0989 Other specified symptoms and signs involving the circulatory and respiratory systems: Secondary | ICD-10-CM

## 2013-11-27 DIAGNOSIS — Z950 Presence of cardiac pacemaker: Secondary | ICD-10-CM

## 2013-11-27 DIAGNOSIS — F341 Dysthymic disorder: Secondary | ICD-10-CM

## 2013-11-27 DIAGNOSIS — F418 Other specified anxiety disorders: Secondary | ICD-10-CM

## 2013-11-27 LAB — POCT GLYCOSYLATED HEMOGLOBIN (HGB A1C)

## 2013-11-27 LAB — GLUCOSE, POCT (MANUAL RESULT ENTRY): POC GLUCOSE: 207 mg/dL — AB (ref 70–99)

## 2013-11-27 MED ORDER — INSULIN GLARGINE 100 UNIT/ML ~~LOC~~ SOLN: 40.0000 [IU] | Freq: Two times a day (BID) | SUBCUTANEOUS | Status: DC

## 2013-11-27 NOTE — Patient Instructions (Signed)

## 2013-11-27 NOTE — Progress Notes (Addendum)
Subjective:    Patient ID: Frederick Foster, male    DOB: 25-Aug-1950, 63 y.o.   MRN: [id number]  HPI Patient in today for follow up of chronic medical problems- He was seen last week for hospital follow up from syncopal episode- He was doing well when seen. Today his only complaint is neck pain and shoulder pain- has had since they put pacemaker in in July. Patient Active Problem List   Diagnosis Date Noted  . Sick sinus syndrome 05/21/2013  . Abscess of anal and rectal regions 04/27/2013  . Protein-calorie malnutrition, severe 04/08/2013  . Cardiac pacemaker in situ 04/07/2013  . Hyperkalemia 04/07/2013  . Bladder cancer 02/23/2013  . Impaired renal function 01/17/2013  . Arteriosclerosis of coronary artery 01/15/2013  . Depression with anxiety 10/23/2012  . Chronic diastolic CHF (congestive heart failure) 04/26/2012  . CKD (chronic kidney disease), stage III 04/26/2012  . CHB (complete heart block) 04/25/2012  . Acute on chronic diastolic heart failure 40/98/1191  . Atrial flutter 11/19/2011  . Lymphoma 06/22/2011  . DM (diabetes mellitus), type 2, uncontrolled 05/17/2011  . Solitary kidney 05/16/2011  . Hyponatremia 05/16/2011  . HTN (hypertension) 05/13/2011  . Mixed hyperlipidemia 10/22/2008  . Second degree Mobitz I AV block 10/22/2008  . HERNIA, VENTRAL 10/22/2008  . CAROTID BRUIT 10/22/2008  . CORONARY ARTERY BYPASS GRAFT, TWO VESSEL, HX OF 10/22/2008   Outpatient Encounter Prescriptions as of 11/27/2013  Medication Sig  . ALPRAZolam (XANAX) 1 MG tablet Take 1 mg by mouth at bedtime.  Marland Kitchen amLODipine (NORVASC) 5 MG tablet Take 5 mg by mouth every morning.   Marland Kitchen aspirin 81 MG tablet   . aspirin EC 81 MG tablet Take 81 mg by mouth every morning.   . ASSURE COMFORT LANCETS 30G MISC   . atorvastatin (LIPITOR) 40 MG tablet Take 40 mg by mouth every morning.  . citalopram (CELEXA) 20 MG tablet Take 20 mg by mouth every evening.  . furosemide (LASIX) 20 MG tablet Take 20 mg by  mouth every morning.  Marland Kitchen glimepiride (AMARYL) 4 MG tablet Take 1 tablet (4 mg total) by mouth daily before breakfast.  . HYDROcodone-acetaminophen (NORCO/VICODIN) 5-325 MG per tablet Take 1 tablet by mouth every 6 (six) hours as needed. For pain  . isosorbide mononitrate (IMDUR) 30 MG 24 hr tablet Take 1 tablet (30 mg total) by mouth daily.  Marland Kitchen lisinopril (PRINIVIL,ZESTRIL) 20 MG tablet Take 20 mg by mouth every morning.  . metoprolol tartrate (LOPRESSOR) 25 MG tablet Take 25 mg by mouth every morning.   . sodium bicarbonate 650 MG tablet Take 1,300 mg by mouth 3 (three) times daily.       Review of Systems  Constitutional: Negative.   HENT: Negative.   Respiratory: Negative.   Cardiovascular: Negative.   Genitourinary: Negative.   Neurological: Negative.   Psychiatric/Behavioral: Negative.   All other systems reviewed and are negative.      Objective:   Physical Exam  Constitutional: He is oriented to person, place, and time. He appears well-developed and well-nourished.  HENT:  Head: Normocephalic.  Right Ear: External ear normal.  Left Ear: External ear normal.  Nose: Nose normal.  Mouth/Throat: Oropharynx is clear and moist.  Eyes: EOM are normal. Pupils are equal, round, and reactive to light.  Neck: Normal range of motion. Neck supple. No JVD present. No thyromegaly present.  Cardiovascular: Normal rate, regular rhythm, normal heart sounds and intact distal pulses.  Exam reveals no gallop and no friction  rub.   No murmur heard. Varicose veins bil lower ext  Pulmonary/Chest: Effort normal and breath sounds normal. No respiratory distress. He has no wheezes. He has no rales. He exhibits no tenderness.  Abdominal: Soft. Bowel sounds are normal. He exhibits no mass. There is no tenderness.  Bladder ostomy clear  Genitourinary: Prostate normal and penis normal.  Musculoskeletal: Normal range of motion. He exhibits no edema.  Decrease ROM of cervical spine due to pain on  extension and rotation to the left. Grips equal bil Motor strength and sensation bil upper ext. Intact.  Lymphadenopathy:    He has no cervical adenopathy.  Neurological: He is alert and oriented to person, place, and time. No cranial nerve deficit.  Skin: Skin is warm and dry.  Psychiatric: He has a normal mood and affect. His behavior is normal. Judgment and thought content normal.   BP 132/68  Pulse 82  Temp(Src) 98.3 F (36.8 C) (Oral)  Ht 6' (1.829 m)  Wt 236 lb (107.049 kg)  BMI 32.00 kg/m2   Results for orders placed in visit on 11/27/13  POCT GLYCOSYLATED HEMOGLOBIN (HGB A1C)      Result Value Ref Range   Hemoglobin A1C 11.0%    GLUCOSE, POCT (MANUAL RESULT ENTRY)      Result Value Ref Range   POC Glucose 207 (*) 70 - 99 mg/dl        Assessment & Plan:   1. HTN (hypertension)   2. DM (diabetes mellitus), type 2, uncontrolled   3. Mixed hyperlipidemia   4. Chronic diastolic CHF (congestive heart failure)   5. CKD (chronic kidney disease), stage III   6. Depression with anxiety   7. Hyperkalemia   8. CAROTID BRUIT   9. Cardiac pacemaker in situ   10. Cervical pain    Orders Placed This Encounter  Procedures  . CMP14+EGFR  . NMR, lipoprofile  . Ambulatory referral to Orthopedic Surgery    Referral Priority:  Routine    Referral Type:  Surgical    Referral Reason:  Specialty Services Required    Requested Specialty:  Orthopedic Surgery    Number of Visits Requested:  1  . POCT glycosylated hemoglobin (Hb A1C)  . POCT glucose (manual entry)   Meds ordered this encounter  Medications  . DISCONTD: insulin glargine (LANTUS) 100 UNIT/ML injection    Sig: Inject 30 Units into the skin 2 (two) times daily.  . insulin glargine (LANTUS) 100 UNIT/ML injection    Sig: Inject 0.4 mLs (40 Units total) into the skin 2 (two) times daily.    Dispense:  10 mL    Refill:  0    Order Specific Question:  Supervising Provider    Answer:  Chipper Herb [[id number]]    Increase lantus to 35u X3 days then increase to 40 u BID Labs pending Health maintenance reviewed Diet and exercise encouraged Continue all meds Follow up  In 3 month   [place], FNP

## 2013-11-28 LAB — CMP14+EGFR
A/G RATIO: 1.5 (ref 1.1–2.5)
ALT: 15 IU/L (ref 0–44)
AST: 15 IU/L (ref 0–40)
Albumin: 3.5 g/dL — ABNORMAL LOW (ref 3.6–4.8)
Alkaline Phosphatase: 80 IU/L (ref 39–117)
BUN/Creatinine Ratio: 29 — ABNORMAL HIGH (ref 10–22)
BUN: 58 mg/dL — ABNORMAL HIGH (ref 8–27)
CALCIUM: 8.3 mg/dL — AB (ref 8.6–10.2)
CO2: 15 mmol/L — AB (ref 18–29)
Chloride: 104 mmol/L (ref 97–108)
Creatinine, Ser: 2.01 mg/dL — ABNORMAL HIGH (ref 0.76–1.27)
GFR calc Af Amer: 39 mL/min/{1.73_m2} — ABNORMAL LOW (ref >59)
GFR, EST NON AFRICAN AMERICAN: 34 mL/min/{1.73_m2} — AB (ref >59)
GLUCOSE: 209 mg/dL — AB (ref 65–99)
Globulin, Total: 2.4 g/dL (ref 1.5–4.5)
POTASSIUM: 5 mmol/L (ref 3.5–5.2)
Sodium: 132 mmol/L — ABNORMAL LOW (ref 134–144)
TOTAL PROTEIN: 5.9 g/dL — AB (ref 6.0–8.5)
Total Bilirubin: 0.5 mg/dL (ref 0.0–1.2)

## 2013-11-28 LAB — NMR, LIPOPROFILE
Cholesterol: 107 mg/dL (ref 100–199)
HDL CHOLESTEROL BY NMR: 29 mg/dL — AB (ref >39)
HDL Particle Number: 18.4 umol/L — ABNORMAL LOW (ref >=30.5)
LDL Particle Number: 632 nmol/L (ref <1000)
LDL Size: 20.6 nm (ref >20.5)
LDLC SERPL CALC-MCNC: 55 mg/dL (ref 0–99)
LP-IR Score: 37 (ref <=45)
SMALL LDL PARTICLE NUMBER: 315 nmol/L (ref <=527)
Triglycerides by NMR: 117 mg/dL (ref 0–149)

## 2013-12-20 ENCOUNTER — Other Ambulatory Visit: Payer: Self-pay | Admitting: *Deleted

## 2013-12-20 DIAGNOSIS — E119 Type 2 diabetes mellitus without complications: Secondary | ICD-10-CM

## 2013-12-20 MED ORDER — FUROSEMIDE 20 MG PO TABS: 20.0000 mg | ORAL_TABLET | Freq: Every morning | ORAL | Status: DC

## 2013-12-20 MED ORDER — CITALOPRAM HYDROBROMIDE 20 MG PO TABS: 20.0000 mg | ORAL_TABLET | Freq: Every evening | ORAL | Status: DC

## 2013-12-20 MED ORDER — LISINOPRIL 20 MG PO TABS: 20.0000 mg | ORAL_TABLET | Freq: Every morning | ORAL | Status: DC

## 2013-12-20 MED ORDER — GLIMEPIRIDE 4 MG PO TABS: 4.0000 mg | ORAL_TABLET | Freq: Every day | ORAL | Status: DC

## 2013-12-20 MED ORDER — ISOSORBIDE MONONITRATE ER 30 MG PO TB24: 30.0000 mg | ORAL_TABLET | Freq: Every day | ORAL | Status: DC

## 2013-12-20 MED ORDER — ATORVASTATIN CALCIUM 40 MG PO TABS: 40.0000 mg | ORAL_TABLET | Freq: Every morning | ORAL | Status: DC

## 2014-01-03 ENCOUNTER — Other Ambulatory Visit: Payer: Self-pay | Admitting: Nurse Practitioner

## 2014-01-04 NOTE — Telephone Encounter (Signed)
Last seen 11/27/13  MMM  Requesting 90 day supply  If approved route to nurse to call into [facility]

## 2014-01-06 NOTE — Telephone Encounter (Signed)
Please call in xanax with 1 refills 

## 2014-01-07 NOTE — Telephone Encounter (Signed)
Called in.

## 2014-02-11 ENCOUNTER — Telehealth: Payer: Self-pay | Admitting: Nurse Practitioner

## 2014-02-11 MED ORDER — SODIUM BICARBONATE 650 MG PO TABS: 1300.0000 mg | ORAL_TABLET | Freq: Three times a day (TID) | ORAL | Status: DC

## 2014-02-11 NOTE — Telephone Encounter (Signed)
I sent the Rx to the pharmacy.

## 2014-02-25 ENCOUNTER — Ambulatory Visit (INDEPENDENT_AMBULATORY_CARE_PROVIDER_SITE_OTHER): Payer: Medicare HMO | Admitting: Nurse Practitioner

## 2014-02-25 ENCOUNTER — Encounter: Payer: Self-pay | Admitting: Nurse Practitioner

## 2014-02-25 VITALS — BP 101/59 | HR 78 | Temp 96.8°F | Ht 72.0 in | Wt 236.0 lb

## 2014-02-25 DIAGNOSIS — J0111 Acute recurrent frontal sinusitis: Secondary | ICD-10-CM

## 2014-02-25 DIAGNOSIS — J0110 Acute frontal sinusitis, unspecified: Secondary | ICD-10-CM

## 2014-02-25 MED ORDER — SODIUM BICARBONATE 650 MG PO TABS: 1300.0000 mg | ORAL_TABLET | Freq: Three times a day (TID) | ORAL | Status: DC

## 2014-02-25 MED ORDER — AZITHROMYCIN 250 MG PO TABS: ORAL_TABLET | ORAL | Status: DC

## 2014-02-25 NOTE — Progress Notes (Signed)
Subjective:    Patient ID: Frederick Foster, male    DOB: June 18, 1951, 63 y.o.   MRN: [id number]  HPI Patient in today c/o cough and congestion- started 10 days ago- has gotten worser- ha pressure in his face and his teeth hurt when he eats.    Review of Systems  Constitutional: Positive for fatigue. Negative for fever and chills.  HENT: Positive for congestion and postnasal drip. Negative for sore throat, trouble swallowing and voice change.   Respiratory: Positive for cough. Choking: only in the mornings.   Cardiovascular: Negative.   Genitourinary: Negative.   Musculoskeletal: Negative.   Neurological: Negative.   Psychiatric/Behavioral: Negative.        Objective:   Physical Exam  Constitutional: He is oriented to person, place, and time. He appears well-developed and well-nourished. No distress.  HENT:  Right Ear: Hearing, tympanic membrane, external ear and ear canal normal.  Left Ear: Hearing, tympanic membrane, external ear and ear canal normal.  Nose: Mucosal edema and rhinorrhea present. Right sinus exhibits frontal sinus tenderness. Right sinus exhibits no maxillary sinus tenderness. Left sinus exhibits frontal sinus tenderness. Left sinus exhibits no maxillary sinus tenderness.  Mouth/Throat: Uvula is midline, oropharynx is clear and moist and mucous membranes are normal.  Eyes: Pupils are equal, round, and reactive to light.  Neck: Normal range of motion. Neck supple.  Cardiovascular: Normal rate and normal heart sounds.   Pulmonary/Chest: Effort normal and breath sounds normal.  Abdominal: Soft. Bowel sounds are normal.  Lymphadenopathy:    He has no cervical adenopathy.  Neurological: He is alert and oriented to person, place, and time.  Skin: Skin is warm.  Psychiatric: He has a normal mood and affect. His behavior is normal. Judgment and thought content normal.   BP 101/59  Pulse 78  Temp(Src) 96.8 F (36 C) (Oral)  Ht 6' (1.829 m)  Wt 236 lb (107.049 kg)   BMI 32.00 kg/m2        Assessment & Plan:   1. Acute recurrent frontal sinusitis    Meds ordered this encounter  Medications  . sodium bicarbonate 650 MG tablet    Sig: Take 2 tablets (1,300 mg total) by mouth 3 (three) times daily.    Dispense:  180 tablet    Refill:  3    Order Specific Question:  Supervising Provider    Answer:  Chipper Herb [[id number]]  . azithromycin (ZITHROMAX Z-PAK) 250 MG tablet    Sig: As directed    Dispense:  6 each    Refill:  0    Order Specific Question:  Supervising Provider    Answer:  Chipper Herb [[id number]]   1. Take meds as prescribed 2. Use a cool mist humidifier especially during the winter months and when heat has been humid. 3. Use saline nose sprays frequently 4. Saline irrigations of the nose can be very helpful if done frequently.  * 4X daily for 1 week*  * Use of a nettie pot can be helpful with this. Follow directions with this* 5. Drink plenty of fluids 6. Keep thermostat turn down low 7.For any cough or congestion  Use plain Mucinex- regular strength or max strength is fine   * Children- consult with Pharmacist for dosing 8. For fever or aces or pains- take tylenol or ibuprofen appropriate for age and weight.  * for fevers greater than 101 orally you may alternate ibuprofen and tylenol every  3 hours.   Mary-Margaret Hassell Done, FNP

## 2014-02-25 NOTE — Patient Instructions (Signed)

## 2014-03-04 ENCOUNTER — Ambulatory Visit (INDEPENDENT_AMBULATORY_CARE_PROVIDER_SITE_OTHER): Payer: Medicare HMO | Admitting: Nurse Practitioner

## 2014-03-04 ENCOUNTER — Encounter: Payer: Self-pay | Admitting: Nurse Practitioner

## 2014-03-04 VITALS — BP 145/72 | HR 73 | Temp 97.7°F | Ht 72.0 in | Wt 231.0 lb

## 2014-03-04 DIAGNOSIS — IMO0002 Reserved for concepts with insufficient information to code with codable children: Secondary | ICD-10-CM

## 2014-03-04 DIAGNOSIS — IMO0001 Reserved for inherently not codable concepts without codable children: Secondary | ICD-10-CM

## 2014-03-04 DIAGNOSIS — F418 Other specified anxiety disorders: Secondary | ICD-10-CM

## 2014-03-04 DIAGNOSIS — E1165 Type 2 diabetes mellitus with hyperglycemia: Secondary | ICD-10-CM

## 2014-03-04 DIAGNOSIS — N183 Chronic kidney disease, stage 3 unspecified: Secondary | ICD-10-CM

## 2014-03-04 DIAGNOSIS — I509 Heart failure, unspecified: Secondary | ICD-10-CM

## 2014-03-04 DIAGNOSIS — I10 Essential (primary) hypertension: Secondary | ICD-10-CM

## 2014-03-04 DIAGNOSIS — I441 Atrioventricular block, second degree: Secondary | ICD-10-CM

## 2014-03-04 DIAGNOSIS — E875 Hyperkalemia: Secondary | ICD-10-CM

## 2014-03-04 DIAGNOSIS — I5032 Chronic diastolic (congestive) heart failure: Secondary | ICD-10-CM

## 2014-03-04 DIAGNOSIS — Z950 Presence of cardiac pacemaker: Secondary | ICD-10-CM

## 2014-03-04 DIAGNOSIS — F341 Dysthymic disorder: Secondary | ICD-10-CM

## 2014-03-04 DIAGNOSIS — E871 Hypo-osmolality and hyponatremia: Secondary | ICD-10-CM

## 2014-03-04 DIAGNOSIS — I442 Atrioventricular block, complete: Secondary | ICD-10-CM

## 2014-03-04 DIAGNOSIS — E785 Hyperlipidemia, unspecified: Secondary | ICD-10-CM

## 2014-03-04 DIAGNOSIS — Z951 Presence of aortocoronary bypass graft: Secondary | ICD-10-CM

## 2014-03-04 LAB — POCT GLYCOSYLATED HEMOGLOBIN (HGB A1C)

## 2014-03-04 LAB — GLUCOSE, POCT (MANUAL RESULT ENTRY): POC GLUCOSE: 367 mg/dL — AB (ref 70–99)

## 2014-03-04 LAB — POCT UA - MICROALBUMIN: MICROALBUMIN (UR) POC: NEGATIVE mg/L

## 2014-03-04 MED ORDER — INSULIN GLARGINE 300 UNIT/ML ~~LOC~~ SOPN: 60.0000 [IU] | PEN_INJECTOR | Freq: Every day | SUBCUTANEOUS | Status: DC

## 2014-03-04 NOTE — Progress Notes (Signed)
Subjective:    Patient ID: Frederick Foster, male    DOB: 01-01-1951, 63 y.o.   MRN: [id number]  HPI Patient in today for follow up of chronic medical problems- He says that he is feeling well today. He has been very sick over the last few years with multiple hospitalizations. He is feeling quite well now- He has a pacemaker in and sees the cardiologist about every six months. He is also a diabetic and says that his blood sugars have been running high in mornings- 175-200- he denies any low blood sugars. He has not been taking his lantus because he can nit afford to pay for his medicine and his wifes meds and he chooses to pay for her. Other wise there have been no changes. Patient Active Problem List   Diagnosis Date Noted  . Sick sinus syndrome 05/21/2013  . Cardiac pacemaker in situ 04/07/2013  . Hyperkalemia 04/07/2013  . Bladder cancer 02/23/2013  . Arteriosclerosis of coronary artery 01/15/2013  . Depression with anxiety 10/23/2012  . Chronic diastolic CHF (congestive heart failure) 04/26/2012  . CKD (chronic kidney disease), stage III 04/26/2012  . CHB (complete heart block) 04/25/2012  . Acute on chronic diastolic heart failure 56/25/6389  . Atrial flutter 11/19/2011  . Lymphoma 06/22/2011  . DM (diabetes mellitus), type 2, uncontrolled 05/17/2011  . Solitary kidney 05/16/2011  . Hyponatremia 05/16/2011  . HTN (hypertension) 05/13/2011  . Hyperlipidemia with target LDL less than 100 10/22/2008  . HERNIA, VENTRAL 10/22/2008  . CAROTID BRUIT 10/22/2008  . CORONARY ARTERY BYPASS GRAFT, TWO VESSEL, HX OF 10/22/2008    Outpatient Encounter Prescriptions as of 03/04/2014  Medication Sig  . ALPRAZolam (XANAX) 1 MG tablet TAKE ONE TABLET AT BEDTIME AS NEEDED  . amLODipine (NORVASC) 5 MG tablet Take 5 mg by mouth every morning.   Marland Kitchen aspirin 81 MG tablet   . ASSURE COMFORT LANCETS 30G MISC   . atorvastatin (LIPITOR) 40 MG tablet Take 1 tablet (40 mg total) by mouth every morning.  .  citalopram (CELEXA) 20 MG tablet Take 1 tablet (20 mg total) by mouth every evening.  . furosemide (LASIX) 20 MG tablet Take 1 tablet (20 mg total) by mouth every morning.  Marland Kitchen glimepiride (AMARYL) 4 MG tablet Take 1 tablet (4 mg total) by mouth daily before breakfast.  . insulin glargine (LANTUS) 100 UNIT/ML injection Inject 0.4 mLs (40 Units total) into the skin 2 (two) times daily.  . isosorbide mononitrate (IMDUR) 30 MG 24 hr tablet Take 1 tablet (30 mg total) by mouth daily.  Marland Kitchen lisinopril (PRINIVIL,ZESTRIL) 20 MG tablet Take 1 tablet (20 mg total) by mouth every morning.  . metoprolol tartrate (LOPRESSOR) 25 MG tablet Take 25 mg by mouth every morning.   . sodium bicarbonate 650 MG tablet Take 2 tablets (1,300 mg total) by mouth 3 (three) times daily.  Marland Kitchen HYDROcodone-acetaminophen (NORCO/VICODIN) 5-325 MG per tablet Take 1 tablet by mouth every 6 (six) hours as needed. For pain  . [DISCONTINUED] azithromycin (ZITHROMAX Z-PAK) 250 MG tablet As directed      Review of Systems  Constitutional: Negative.   HENT: Negative.   Respiratory: Negative.   Cardiovascular: Negative.   Genitourinary: Negative.   Neurological: Negative.   Psychiatric/Behavioral: Negative.   All other systems reviewed and are negative.      Objective:   Physical Exam  Constitutional: He is oriented to person, place, and time. He appears well-developed and well-nourished.  HENT:  Head: Normocephalic.  Right  Ear: External ear normal.  Left Ear: External ear normal.  Nose: Nose normal.  Mouth/Throat: Oropharynx is clear and moist.  Eyes: EOM are normal. Pupils are equal, round, and reactive to light.  Neck: Normal range of motion. Neck supple. No JVD present. No thyromegaly present.  Cardiovascular: Normal rate, regular rhythm and intact distal pulses.  Exam reveals no gallop and no friction rub.   Murmur (2/6 bil carotids) heard. Pulmonary/Chest: Effort normal and breath sounds normal. No respiratory distress.  He has no wheezes. He has no rales. He exhibits no tenderness.  Abdominal: Soft. Bowel sounds are normal. He exhibits no mass. There is no tenderness.  Genitourinary: Prostate normal and penis normal.  cystoostomy  Bag- no signs of infection  Musculoskeletal: Normal range of motion. He exhibits no edema.  Lymphadenopathy:    He has no cervical adenopathy.  Neurological: He is alert and oriented to person, place, and time. No cranial nerve deficit.  Skin: Skin is warm and dry.  Psychiatric: He has a normal mood and affect. His behavior is normal. Judgment and thought content normal.    BP 145/72  Pulse 73  Temp(Src) 97.7 F (36.5 C) (Oral)  Ht 6' (1.829 m)  Wt 231 lb (104.781 kg)  BMI 31.32 kg/m2 Results for orders placed in visit on 03/04/14  POCT UA - MICROALBUMIN      Result Value Ref Range   Microalbumin Ur, POC neg    POCT GLYCOSYLATED HEMOGLOBIN (HGB A1C)      Result Value Ref Range   Hemoglobin A1C 12.3%    GLUCOSE, POCT (MANUAL RESULT ENTRY)      Result Value Ref Range   POC Glucose 367 (*) 70 - 99 mg/dl         Assessment & Plan:   1. DM (diabetes mellitus), type 2, uncontrolled   2. Second degree Mobitz I AV block   3. Hyperlipidemia with target LDL less than 100   4. Hyperkalemia   5. Essential hypertension   6. Hyponatremia   7. Depression with anxiety   8. CKD (chronic kidney disease), stage III   9. Chronic diastolic CHF (congestive heart failure)   10. Cardiac pacemaker in situ   11. CHB (complete heart block)    Orders Placed This Encounter  Procedures  . CMP14+EGFR  . NMR, lipoprofile  . POCT UA - Microalbumin  . POCT glycosylated hemoglobin (Hb A1C)  . POCT glucose (manual entry)   Meds ordered this encounter  Medications  . Insulin Glargine (TOUJEO SOLOSTAR) 300 UNIT/ML SOPN    Sig: Inject 60 Units into the skin daily.    Dispense:  1 pen    Refill:  0    Order Specific Question:  Supervising Provider    Answer:  Chipper Herb [place] pending Health maintenance reviewed Diet and exercise encouraged Continue all meds Follow up  In 3 months   [place], FNP

## 2014-03-04 NOTE — Patient Instructions (Signed)
Diabetes and Foot Care Diabetes may cause you to have problems because of poor blood supply (circulation) to your feet and legs. This may cause the skin on your feet to become thinner, break easier, and heal more slowly. Your skin may become dry, and the skin may peel and crack. You may also have nerve damage in your legs and feet causing decreased feeling in them. You may not notice minor injuries to your feet that could lead to infections or more serious problems. Taking care of your feet is one of the most important things you can do for yourself.  HOME CARE INSTRUCTIONS  Wear shoes at all times, even in the house. Do not go barefoot. Bare feet are easily injured.  Check your feet daily for blisters, cuts, and redness. If you cannot see the bottom of your feet, use a mirror or ask someone for help.  Wash your feet with warm water (do not use hot water) and mild soap. Then pat your feet and the areas between your toes until they are completely dry. Do not soak your feet as this can dry your skin.  Apply a moisturizing lotion or petroleum jelly (that does not contain alcohol and is unscented) to the skin on your feet and to dry, brittle toenails. Do not apply lotion between your toes.  Trim your toenails straight across. Do not dig under them or around the cuticle. File the edges of your nails with an emery board or nail file.  Do not cut corns or calluses or try to remove them with medicine.  Wear clean socks or stockings every day. Make sure they are not too tight. Do not wear knee-high stockings since they may decrease blood flow to your legs.  Wear shoes that fit properly and have enough cushioning. To break in new shoes, wear them for just a few hours a day. This prevents you from injuring your feet. Always look in your shoes before you put them on to be sure there are no objects inside.  Do not cross your legs. This may decrease the blood flow to your feet.  If you find a minor scrape,  cut, or break in the skin on your feet, keep it and the skin around it clean and dry. These areas may be cleansed with mild soap and water. Do not cleanse the area with peroxide, alcohol, or iodine.  When you remove an adhesive bandage, be sure not to damage the skin around it.  If you have a wound, look at it several times a day to make sure it is healing.  Do not use heating pads or hot water bottles. They may burn your skin. If you have lost feeling in your feet or legs, you may not know it is happening until it is too late.  Make sure your health care provider performs a complete foot exam at least annually or more often if you have foot problems. Report any cuts, sores, or bruises to your health care provider immediately. SEEK MEDICAL CARE IF:   You have an injury that is not healing.  You have cuts or breaks in the skin.  You have an ingrown nail.  You notice redness on your legs or feet.  You feel burning or tingling in your legs or feet.  You have pain or cramps in your legs and feet.  Your legs or feet are numb.  Your feet always feel cold. SEEK IMMEDIATE MEDICAL CARE IF:   There is increasing redness,   swelling, or pain in or around a wound.  There is a red line that goes up your leg.  Pus is coming from a wound.  You develop a fever or as directed by your health care provider.  You notice a bad smell coming from an ulcer or wound. Document Released: 06/25/2000 Document Revised: 02/28/2013 Document Reviewed: 12/05/2012 Exit[employer] Patient Information 2015 [employer]. This information is not intended to replace advice given to you by your health care provider. Make sure you discuss any questions you have with your health care provider.  

## 2014-03-05 LAB — CMP14+EGFR
ALK PHOS: 89 IU/L (ref 39–117)
ALT: 22 IU/L (ref 0–44)
AST: 14 IU/L (ref 0–40)
Albumin/Globulin Ratio: 1.3 (ref 1.1–2.5)
Albumin: 3.5 g/dL — ABNORMAL LOW (ref 3.6–4.8)
BUN / CREAT RATIO: 26 — AB (ref 10–22)
BUN: 43 mg/dL — ABNORMAL HIGH (ref 8–27)
CALCIUM: 8.7 mg/dL (ref 8.6–10.2)
CHLORIDE: 100 mmol/L (ref 97–108)
CO2: 17 mmol/L — AB (ref 18–29)
Creatinine, Ser: 1.68 mg/dL — ABNORMAL HIGH (ref 0.76–1.27)
GFR calc Af Amer: 49 mL/min/{1.73_m2} — ABNORMAL LOW (ref >59)
GFR calc non Af Amer: 42 mL/min/{1.73_m2} — ABNORMAL LOW (ref >59)
GLOBULIN, TOTAL: 2.7 g/dL (ref 1.5–4.5)
GLUCOSE: 331 mg/dL — AB (ref 65–99)
Potassium: 5.4 mmol/L — ABNORMAL HIGH (ref 3.5–5.2)
Sodium: 133 mmol/L — ABNORMAL LOW (ref 134–144)
TOTAL PROTEIN: 6.2 g/dL (ref 6.0–8.5)
Total Bilirubin: 0.3 mg/dL (ref 0.0–1.2)

## 2014-03-05 LAB — NMR, LIPOPROFILE
CHOLESTEROL: 106 mg/dL (ref 100–199)
HDL CHOLESTEROL BY NMR: 30 mg/dL — AB (ref >39)
HDL PARTICLE NUMBER: 16.7 umol/L — AB (ref >=30.5)
LDL PARTICLE NUMBER: 508 nmol/L (ref <1000)
LDL Size: 20.5 nm (ref >20.5)
LDLC SERPL CALC-MCNC: 56 mg/dL (ref 0–99)
LP-IR SCORE: 57 — AB (ref <=45)
Small LDL Particle Number: 236 nmol/L (ref <=527)
TRIGLYCERIDES BY NMR: 98 mg/dL (ref 0–149)

## 2014-03-05 LAB — SPECIMEN STATUS REPORT

## 2014-03-19 ENCOUNTER — Telehealth: Payer: Self-pay | Admitting: Nurse Practitioner

## 2014-03-19 DIAGNOSIS — E1165 Type 2 diabetes mellitus with hyperglycemia: Secondary | ICD-10-CM

## 2014-03-20 ENCOUNTER — Telehealth: Payer: Self-pay | Admitting: Nurse Practitioner

## 2014-03-20 NOTE — Telephone Encounter (Signed)
Call given to [place].

## 2014-03-22 MED ORDER — ALPRAZOLAM 1 MG PO TABS: ORAL_TABLET | ORAL | Status: DC

## 2014-03-22 MED ORDER — METOPROLOL TARTRATE 25 MG PO TABS: 25.0000 mg | ORAL_TABLET | Freq: Every morning | ORAL | Status: DC

## 2014-03-22 MED ORDER — FUROSEMIDE 20 MG PO TABS: 20.0000 mg | ORAL_TABLET | Freq: Every morning | ORAL | Status: DC

## 2014-03-22 MED ORDER — ATORVASTATIN CALCIUM 40 MG PO TABS: 40.0000 mg | ORAL_TABLET | Freq: Every morning | ORAL | Status: DC

## 2014-03-22 MED ORDER — ISOSORBIDE MONONITRATE ER 30 MG PO TB24: 30.0000 mg | ORAL_TABLET | Freq: Every day | ORAL | Status: DC

## 2014-03-22 MED ORDER — CITALOPRAM HYDROBROMIDE 20 MG PO TABS: 20.0000 mg | ORAL_TABLET | Freq: Every evening | ORAL | Status: DC

## 2014-03-22 MED ORDER — GLIMEPIRIDE 4 MG PO TABS: 4.0000 mg | ORAL_TABLET | Freq: Every day | ORAL | Status: DC

## 2014-03-22 MED ORDER — LISINOPRIL 20 MG PO TABS: 20.0000 mg | ORAL_TABLET | Freq: Every morning | ORAL | Status: DC

## 2014-03-22 MED ORDER — SODIUM BICARBONATE 650 MG PO TABS: 1300.0000 mg | ORAL_TABLET | Freq: Three times a day (TID) | ORAL | Status: DC

## 2014-03-22 NOTE — Telephone Encounter (Signed)
rx ready for pickup 

## 2014-03-25 NOTE — Telephone Encounter (Signed)
Patient aware.

## 2014-03-28 ENCOUNTER — Telehealth: Payer: Self-pay | Admitting: Family Medicine

## 2014-03-28 NOTE — Telephone Encounter (Signed)
We had request for refill- if has  Not been taking then do not take

## 2014-03-29 NOTE — Telephone Encounter (Signed)
Left message on patient's voicemail to not restart if this isn't something he has been taking and to call back if he has any questions.

## 2014-04-12 ENCOUNTER — Telehealth: Payer: Self-pay | Admitting: Pharmacist

## 2014-04-12 ENCOUNTER — Other Ambulatory Visit: Payer: Self-pay | Admitting: Pharmacist

## 2014-04-12 ENCOUNTER — Telehealth: Payer: Self-pay | Admitting: Nurse Practitioner

## 2014-04-12 DIAGNOSIS — E1165 Type 2 diabetes mellitus with hyperglycemia: Secondary | ICD-10-CM

## 2014-04-12 DIAGNOSIS — IMO0002 Reserved for concepts with insufficient information to code with codable children: Secondary | ICD-10-CM

## 2014-04-12 MED ORDER — INSULIN GLARGINE 300 UNIT/ML ~~LOC~~ SOPN: 60.0000 [IU] | PEN_INJECTOR | Freq: Every day | SUBCUTANEOUS | Status: DC

## 2014-04-12 NOTE — Telephone Encounter (Signed)
Patient picked up Rx and only received 1 box of Toujeo.  The rx I sent in was for 2 boxes or 75mL.  This is greater than 30 days supply so pharmacy only filled for 1 box ( if he had gotten 2 boxes would have charged him 2 copays per Aaron Edelman at [facility]).  Patient OK with this explanation and will contact insurance.

## 2014-04-14 NOTE — Telephone Encounter (Signed)
Was filled on 04/12/14

## 2014-06-10 ENCOUNTER — Encounter: Payer: Self-pay | Admitting: Nurse Practitioner

## 2014-06-10 ENCOUNTER — Telehealth: Payer: Self-pay | Admitting: Nurse Practitioner

## 2014-06-10 ENCOUNTER — Ambulatory Visit (INDEPENDENT_AMBULATORY_CARE_PROVIDER_SITE_OTHER): Payer: Commercial Managed Care - HMO | Admitting: Nurse Practitioner

## 2014-06-10 VITALS — BP 121/71 | HR 86 | Temp 98.4°F | Wt 258.0 lb

## 2014-06-10 DIAGNOSIS — E875 Hyperkalemia: Secondary | ICD-10-CM

## 2014-06-10 DIAGNOSIS — IMO0002 Reserved for concepts with insufficient information to code with codable children: Secondary | ICD-10-CM

## 2014-06-10 DIAGNOSIS — R0989 Other specified symptoms and signs involving the circulatory and respiratory systems: Secondary | ICD-10-CM

## 2014-06-10 DIAGNOSIS — Z951 Presence of aortocoronary bypass graft: Secondary | ICD-10-CM

## 2014-06-10 DIAGNOSIS — I5033 Acute on chronic diastolic (congestive) heart failure: Secondary | ICD-10-CM

## 2014-06-10 DIAGNOSIS — F418 Other specified anxiety disorders: Secondary | ICD-10-CM

## 2014-06-10 DIAGNOSIS — N183 Chronic kidney disease, stage 3 unspecified: Secondary | ICD-10-CM

## 2014-06-10 DIAGNOSIS — E1165 Type 2 diabetes mellitus with hyperglycemia: Secondary | ICD-10-CM

## 2014-06-10 DIAGNOSIS — Z950 Presence of cardiac pacemaker: Secondary | ICD-10-CM

## 2014-06-10 DIAGNOSIS — I10 Essential (primary) hypertension: Secondary | ICD-10-CM

## 2014-06-10 DIAGNOSIS — E785 Hyperlipidemia, unspecified: Secondary | ICD-10-CM

## 2014-06-10 LAB — POCT GLYCOSYLATED HEMOGLOBIN (HGB A1C): Hemoglobin A1C: 8.3

## 2014-06-10 MED ORDER — ISOSORBIDE MONONITRATE ER 30 MG PO TB24: 30.0000 mg | ORAL_TABLET | Freq: Every day | ORAL | Status: DC

## 2014-06-10 MED ORDER — SODIUM BICARBONATE 650 MG PO TABS: 1300.0000 mg | ORAL_TABLET | Freq: Three times a day (TID) | ORAL | Status: DC

## 2014-06-10 MED ORDER — ALPRAZOLAM 1 MG PO TABS: ORAL_TABLET | ORAL | Status: DC

## 2014-06-10 MED ORDER — ATORVASTATIN CALCIUM 40 MG PO TABS: 40.0000 mg | ORAL_TABLET | Freq: Every morning | ORAL | Status: DC

## 2014-06-10 MED ORDER — CITALOPRAM HYDROBROMIDE 20 MG PO TABS: 20.0000 mg | ORAL_TABLET | Freq: Every evening | ORAL | Status: DC

## 2014-06-10 MED ORDER — HYDROCODONE-ACETAMINOPHEN 5-325 MG PO TABS: 1.0000 | ORAL_TABLET | Freq: Four times a day (QID) | ORAL | Status: DC | PRN

## 2014-06-10 MED ORDER — AMLODIPINE BESYLATE 5 MG PO TABS: 5.0000 mg | ORAL_TABLET | Freq: Every morning | ORAL | Status: DC

## 2014-06-10 MED ORDER — GLIMEPIRIDE 4 MG PO TABS: 4.0000 mg | ORAL_TABLET | Freq: Every day | ORAL | Status: DC

## 2014-06-10 MED ORDER — INSULIN GLARGINE 300 UNIT/ML ~~LOC~~ SOPN: 60.0000 [IU] | PEN_INJECTOR | Freq: Every day | SUBCUTANEOUS | Status: DC

## 2014-06-10 MED ORDER — LISINOPRIL 20 MG PO TABS: 20.0000 mg | ORAL_TABLET | Freq: Every morning | ORAL | Status: DC

## 2014-06-10 MED ORDER — FUROSEMIDE 20 MG PO TABS: 20.0000 mg | ORAL_TABLET | Freq: Every morning | ORAL | Status: DC

## 2014-06-10 NOTE — Patient Instructions (Signed)

## 2014-06-10 NOTE — Progress Notes (Signed)
Subjective:    Patient ID: Frederick Foster, male    DOB: 13-Feb-1951, 63 y.o.   MRN: [id number]  HPI Patient in today for follow up of chronic medical problems- He has been doing okay since last visit- Has not had to go to the hospital. He does c/o weight gain- denies increasing SOB but odes still experience dyspnea. He say sthat his blood sugars are running around 150 fasting- he does not check as often as he should. He has not been watching his diet.  Patient Active Problem List   Diagnosis Date Noted  . Sick sinus syndrome 05/21/2013  . Cardiac pacemaker in situ 04/07/2013  . Hyperkalemia 04/07/2013  . Bladder cancer 02/23/2013  . Arteriosclerosis of coronary artery 01/15/2013  . Depression with anxiety 10/23/2012  . Chronic diastolic CHF (congestive heart failure) 04/26/2012  . CKD (chronic kidney disease), stage III 04/26/2012  . CHB (complete heart block) 04/25/2012  . Acute on chronic diastolic heart failure 81/85/6314  . Atrial flutter 11/19/2011  . Lymphoma 06/22/2011  . DM (diabetes mellitus), type 2, uncontrolled 05/17/2011  . Solitary kidney 05/16/2011  . Hyponatremia 05/16/2011  . HTN (hypertension) 05/13/2011  . Hyperlipidemia with target LDL less than 100 10/22/2008  . HERNIA, VENTRAL 10/22/2008  . Carotid bruit 10/22/2008  . CORONARY ARTERY BYPASS GRAFT, TWO VESSEL, HX OF 10/22/2008   Outpatient Encounter Prescriptions as of 06/10/2014  Medication Sig  . ALPRAZolam (XANAX) 1 MG tablet TAKE ONE TABLET AT BEDTIME AS NEEDED  . amLODipine (NORVASC) 5 MG tablet Take 5 mg by mouth every morning.   Marland Kitchen aspirin 81 MG tablet   . ASSURE COMFORT LANCETS 30G MISC   . atorvastatin (LIPITOR) 40 MG tablet Take 1 tablet (40 mg total) by mouth every morning.  . citalopram (CELEXA) 20 MG tablet Take 1 tablet (20 mg total) by mouth every evening.  . furosemide (LASIX) 20 MG tablet Take 1 tablet (20 mg total) by mouth every morning.  Marland Kitchen glimepiride (AMARYL) 4 MG tablet Take 1 tablet  (4 mg total) by mouth daily before breakfast.  . HYDROcodone-acetaminophen (NORCO/VICODIN) 5-325 MG per tablet Take 1 tablet by mouth every 6 (six) hours as needed. For pain  . Insulin Glargine (TOUJEO SOLOSTAR) 300 UNIT/ML SOPN Inject 60 Units into the skin daily.  . isosorbide mononitrate (IMDUR) 30 MG 24 hr tablet Take 1 tablet (30 mg total) by mouth daily.  Marland Kitchen lisinopril (PRINIVIL,ZESTRIL) 20 MG tablet Take 1 tablet (20 mg total) by mouth every morning.  . metoprolol tartrate (LOPRESSOR) 25 MG tablet Take 1 tablet (25 mg total) by mouth every morning.  . sodium bicarbonate 650 MG tablet Take 2 tablets (1,300 mg total) by mouth 3 (three) times daily.       Review of Systems  Constitutional: Negative.   HENT: Negative.   Respiratory: Positive for shortness of breath (no more thne usual).   Cardiovascular: Negative.   Gastrointestinal: Negative.   Genitourinary: Negative.   Neurological: Negative.   Psychiatric/Behavioral: Negative.   All other systems reviewed and are negative.      Objective:   Physical Exam  Constitutional: He is oriented to person, place, and time. He appears well-developed and well-nourished.  HENT:  Head: Normocephalic.  Right Ear: External ear normal.  Left Ear: External ear normal.  Nose: Nose normal.  Mouth/Throat: Oropharynx is clear and moist.  Eyes: EOM are normal. Pupils are equal, round, and reactive to light.  Neck: Normal range of motion. Neck supple. No JVD  present. No thyromegaly present.  Cardiovascular: Normal rate, regular rhythm, normal heart sounds and intact distal pulses.  Exam reveals no gallop and no friction rub.   No murmur heard. Pulmonary/Chest: Effort normal and breath sounds normal. No respiratory distress. He has no wheezes. He has no rales. He exhibits no tenderness.  Abdominal: Soft. Bowel sounds are normal. He exhibits no mass. There is no tenderness.  Cystostomy bag in place- ostomy stump healthy and pink.    Musculoskeletal: Normal range of motion. He exhibits no edema.  Lymphadenopathy:    He has no cervical adenopathy.  Neurological: He is alert and oriented to person, place, and time. No cranial nerve deficit.  Skin: Skin is warm and dry.  Psychiatric: He has a normal mood and affect. His behavior is normal. Judgment and thought content normal.   BP 121/71 mmHg  Pulse 86  Temp(Src) 98.4 F (36.9 C) (Oral)  Wt 258 lb (117.028 kg)   Results for orders placed or performed in visit on 06/10/14  POCT glycosylated hemoglobin (Hb A1C)  Result Value Ref Range   Hemoglobin A1C 8.3         Assessment & Plan:   1. DM (diabetes mellitus), type 2, uncontrolled   2. Essential hypertension   3. CORONARY ARTERY BYPASS GRAFT, TWO VESSEL, HX OF   4. Acute on chronic diastolic heart failure   5. CKD (chronic kidney disease), stage III   6. Depression with anxiety   7. Hyperlipidemia with target LDL less than 100   8. Hyperkalemia   9. Cardiac pacemaker in situ   10. Bilateral carotid bruits   11. Type 2 diabetes mellitus with hyperglycemia    Meds ordered this encounter  Medications  . Insulin Glargine (TOUJEO SOLOSTAR) 300 UNIT/ML SOPN    Sig: Inject 60 Units into the skin daily. BID    Dispense:  6 pen    Refill:  5    Order Specific Question:  Supervising Provider    Answer:  Chipper Herb [[id number]]  . ALPRAZolam (XANAX) 1 MG tablet    Sig: TAKE ONE TABLET AT BEDTIME AS NEEDED    Dispense:  90 tablet    Refill:  1    Order Specific Question:  Supervising Provider    Answer:  Chipper Herb [[id number]]  . citalopram (CELEXA) 20 MG tablet    Sig: Take 1 tablet (20 mg total) by mouth every evening.    Dispense:  90 tablet    Refill:  1    Order Specific Question:  Supervising Provider    Answer:  Chipper Herb [[id number]]  . amLODipine (NORVASC) 5 MG tablet    Sig: Take 1 tablet (5 mg total) by mouth every morning.    Dispense:  90 tablet    Refill:  1    Order Specific Question:   Supervising Provider    Answer:  Chipper Herb [[id number]]  . lisinopril (PRINIVIL,ZESTRIL) 20 MG tablet    Sig: Take 1 tablet (20 mg total) by mouth every morning.    Dispense:  90 tablet    Refill:  1    Order Specific Question:  Supervising Provider    Answer:  Chipper Herb [[id number]]  . atorvastatin (LIPITOR) 40 MG tablet    Sig: Take 1 tablet (40 mg total) by mouth every morning.    Dispense:  90 tablet    Refill:  1    Order Specific Question:  Supervising Provider    Answer:  Redge Gainer W [[id number]]  . glimepiride (AMARYL) 4 MG tablet    Sig: Take 1 tablet (4 mg total) by mouth daily before breakfast.    Dispense:  90 tablet    Refill:  1    Order Specific Question:  Supervising Provider    Answer:  Chipper Herb [[id number]]  . furosemide (LASIX) 20 MG tablet    Sig: Take 1 tablet (20 mg total) by mouth every morning.    Dispense:  90 tablet    Refill:  1    Order Specific Question:  Supervising Provider    Answer:  Chipper Herb [[id number]]  . isosorbide mononitrate (IMDUR) 30 MG 24 hr tablet    Sig: Take 1 tablet (30 mg total) by mouth daily.    Dispense:  90 tablet    Refill:  1    Order Specific Question:  Supervising Provider    Answer:  Chipper Herb [[id number]]  . sodium bicarbonate 650 MG tablet    Sig: Take 2 tablets (1,300 mg total) by mouth 3 (three) times daily.    Dispense:  180 tablet    Refill:  3    Order Specific Question:  Supervising Provider    Answer:  Chipper Herb [[id number]]  . HYDROcodone-acetaminophen (NORCO/VICODIN) 5-325 MG per tablet    Sig: Take 1 tablet by mouth every 6 (six) hours as needed. For pain    Dispense:  30 tablet    Refill:  0    Order Specific Question:  Supervising Provider    Answer:  Chipper Herb [[id number]]   Orders Placed This Encounter  Procedures  . CMP14+EGFR  . NMR, lipoprofile  . POCT glycosylated hemoglobin (Hb A1C)   Watch diet Labs pending Health maintenance reviewed Encouraged to do hemocult cards Follow up in 3  months  [place], FNP

## 2014-06-10 NOTE — Telephone Encounter (Signed)
Letter ready for pick up

## 2014-06-11 LAB — CMP14+EGFR
ALK PHOS: 74 IU/L (ref 39–117)
ALT: 11 IU/L (ref 0–44)
AST: 15 IU/L (ref 0–40)
Albumin/Globulin Ratio: 1.7 (ref 1.1–2.5)
Albumin: 3.6 g/dL (ref 3.6–4.8)
BUN / CREAT RATIO: 22 (ref 10–22)
BUN: 44 mg/dL — ABNORMAL HIGH (ref 8–27)
CALCIUM: 8.3 mg/dL — AB (ref 8.6–10.2)
CO2: 18 mmol/L (ref 18–29)
Chloride: 104 mmol/L (ref 97–108)
Creatinine, Ser: 1.98 mg/dL — ABNORMAL HIGH (ref 0.76–1.27)
GFR calc Af Amer: 40 mL/min/{1.73_m2} — ABNORMAL LOW (ref >59)
GFR calc non Af Amer: 34 mL/min/{1.73_m2} — ABNORMAL LOW (ref >59)
Globulin, Total: 2.1 g/dL (ref 1.5–4.5)
Glucose: 151 mg/dL — ABNORMAL HIGH (ref 65–99)
POTASSIUM: 5.6 mmol/L — AB (ref 3.5–5.2)
SODIUM: 136 mmol/L (ref 134–144)
Total Bilirubin: 0.4 mg/dL (ref 0.0–1.2)
Total Protein: 5.7 g/dL — ABNORMAL LOW (ref 6.0–8.5)

## 2014-06-11 LAB — NMR, LIPOPROFILE
Cholesterol: 111 mg/dL (ref 100–199)
HDL CHOLESTEROL BY NMR: 34 mg/dL — AB (ref >39)
HDL Particle Number: 20.9 umol/L — ABNORMAL LOW (ref >=30.5)
LDL Particle Number: 537 nmol/L (ref <1000)
LDL Size: 21.2 nm (ref >20.5)
LDL-C: 52 mg/dL (ref 0–99)
LP-IR SCORE: 41 (ref <=45)
Small LDL Particle Number: <90 nmol/L (ref <=527)
Triglycerides by NMR: 125 mg/dL (ref 0–149)

## 2014-06-11 NOTE — Telephone Encounter (Signed)
Patient aware, letter is ready.

## 2014-06-20 ENCOUNTER — Encounter (HOSPITAL_COMMUNITY): Payer: Self-pay | Admitting: Internal Medicine

## 2014-06-20 ENCOUNTER — Telehealth: Payer: Self-pay | Admitting: Nurse Practitioner

## 2014-06-20 NOTE — Telephone Encounter (Signed)
Pt requesting rx for zpak for sinus infection, does pt ntbs or can we just send rx? Please advise.

## 2014-06-21 ENCOUNTER — Other Ambulatory Visit: Payer: Self-pay | Admitting: Nurse Practitioner

## 2014-06-21 MED ORDER — AZITHROMYCIN 250 MG PO TABS: ORAL_TABLET | ORAL | Status: DC

## 2014-06-21 NOTE — Telephone Encounter (Signed)
The rx went to [facility], i called [employer] to cancel the order and then called [facility] drug and gave verbal order for zpak, pt aware. Will close call.

## 2014-08-30 ENCOUNTER — Emergency Department (HOSPITAL_COMMUNITY)
Admission: EM | Admit: 2014-08-30 | Discharge: 2014-08-30 | Disposition: A | Discharge status: Other Acute Inpatient Hospital | Payer: Commercial Managed Care - HMO | Attending: Emergency Medicine | Admitting: Emergency Medicine

## 2014-08-30 ENCOUNTER — Emergency Department (HOSPITAL_COMMUNITY): Payer: Commercial Managed Care - HMO

## 2014-08-30 ENCOUNTER — Encounter (HOSPITAL_COMMUNITY): Payer: Self-pay | Admitting: *Deleted

## 2014-08-30 DIAGNOSIS — Z8546 Personal history of malignant neoplasm of prostate: Secondary | ICD-10-CM | POA: Insufficient documentation

## 2014-08-30 DIAGNOSIS — N368 Other specified disorders of urethra: Secondary | ICD-10-CM | POA: Diagnosis not present

## 2014-08-30 DIAGNOSIS — Z794 Long term (current) use of insulin: Secondary | ICD-10-CM | POA: Insufficient documentation

## 2014-08-30 DIAGNOSIS — Z7982 Long term (current) use of aspirin: Secondary | ICD-10-CM | POA: Insufficient documentation

## 2014-08-30 DIAGNOSIS — Z880 Allergy status to penicillin: Secondary | ICD-10-CM | POA: Insufficient documentation

## 2014-08-30 DIAGNOSIS — Z79899 Other long term (current) drug therapy: Secondary | ICD-10-CM | POA: Diagnosis not present

## 2014-08-30 DIAGNOSIS — I252 Old myocardial infarction: Secondary | ICD-10-CM | POA: Insufficient documentation

## 2014-08-30 DIAGNOSIS — Z85528 Personal history of other malignant neoplasm of kidney: Secondary | ICD-10-CM | POA: Insufficient documentation

## 2014-08-30 DIAGNOSIS — Z87891 Personal history of nicotine dependence: Secondary | ICD-10-CM | POA: Diagnosis not present

## 2014-08-30 DIAGNOSIS — N183 Chronic kidney disease, stage 3 (moderate): Secondary | ICD-10-CM | POA: Diagnosis not present

## 2014-08-30 DIAGNOSIS — R369 Urethral discharge, unspecified: Secondary | ICD-10-CM | POA: Diagnosis present

## 2014-08-30 DIAGNOSIS — Z951 Presence of aortocoronary bypass graft: Secondary | ICD-10-CM | POA: Insufficient documentation

## 2014-08-30 DIAGNOSIS — Z9889 Other specified postprocedural states: Secondary | ICD-10-CM | POA: Insufficient documentation

## 2014-08-30 DIAGNOSIS — Z8572 Personal history of non-Hodgkin lymphomas: Secondary | ICD-10-CM | POA: Insufficient documentation

## 2014-08-30 DIAGNOSIS — Z8551 Personal history of malignant neoplasm of bladder: Secondary | ICD-10-CM | POA: Insufficient documentation

## 2014-08-30 DIAGNOSIS — F329 Major depressive disorder, single episode, unspecified: Secondary | ICD-10-CM | POA: Insufficient documentation

## 2014-08-30 DIAGNOSIS — E782 Mixed hyperlipidemia: Secondary | ICD-10-CM | POA: Insufficient documentation

## 2014-08-30 DIAGNOSIS — I2510 Atherosclerotic heart disease of native coronary artery without angina pectoris: Secondary | ICD-10-CM | POA: Diagnosis not present

## 2014-08-30 DIAGNOSIS — I4891 Unspecified atrial fibrillation: Secondary | ICD-10-CM | POA: Insufficient documentation

## 2014-08-30 DIAGNOSIS — F410 Panic disorder [episodic paroxysmal anxiety] without agoraphobia: Secondary | ICD-10-CM | POA: Diagnosis not present

## 2014-08-30 DIAGNOSIS — E875 Hyperkalemia: Secondary | ICD-10-CM | POA: Diagnosis not present

## 2014-08-30 DIAGNOSIS — N289 Disorder of kidney and ureter, unspecified: Secondary | ICD-10-CM

## 2014-08-30 LAB — CBC WITH DIFFERENTIAL/PLATELET
Basophils Absolute: 0 10*3/uL (ref 0.0–0.1)
Basophils Relative: 0 % (ref 0–1)
EOS ABS: 0.1 10*3/uL (ref 0.0–0.7)
Eosinophils Relative: 2 % (ref 0–5)
HCT: 33.2 % — ABNORMAL LOW (ref 39.0–52.0)
Hemoglobin: 11.2 g/dL — ABNORMAL LOW (ref 13.0–17.0)
Lymphocytes Relative: 26 % (ref 12–46)
Lymphs Abs: 1.5 10*3/uL (ref 0.7–4.0)
MCH: 30.7 pg (ref 26.0–34.0)
MCHC: 33.7 g/dL (ref 30.0–36.0)
MCV: 91 fL (ref 78.0–100.0)
MONOS PCT: 7 % (ref 3–12)
Monocytes Absolute: 0.4 10*3/uL (ref 0.1–1.0)
NEUTROS PCT: 65 % (ref 43–77)
Neutro Abs: 3.7 10*3/uL (ref 1.7–7.7)
Platelets: 153 10*3/uL (ref 150–400)
RBC: 3.65 MIL/uL — AB (ref 4.22–5.81)
RDW: 13.9 % (ref 11.5–15.5)
WBC: 5.8 10*3/uL (ref 4.0–10.5)

## 2014-08-30 LAB — COMPREHENSIVE METABOLIC PANEL
ALBUMIN: 3.5 g/dL (ref 3.5–5.2)
ALT: 20 U/L (ref 0–53)
ANION GAP: 0 — AB (ref 5–15)
AST: 19 U/L (ref 0–37)
Alkaline Phosphatase: 78 U/L (ref 39–117)
BILIRUBIN TOTAL: 0.6 mg/dL (ref 0.3–1.2)
BUN: 62 mg/dL — AB (ref 6–23)
CHLORIDE: 114 mmol/L — AB (ref 96–112)
CO2: 19 mmol/L (ref 19–32)
CREATININE: 1.97 mg/dL — AB (ref 0.50–1.35)
Calcium: 8.5 mg/dL (ref 8.4–10.5)
GFR calc Af Amer: 39 mL/min — ABNORMAL LOW (ref >90)
GFR, EST NON AFRICAN AMERICAN: 34 mL/min — AB (ref >90)
Glucose, Bld: 281 mg/dL — ABNORMAL HIGH (ref 70–99)
Potassium: 6.5 mmol/L (ref 3.5–5.1)
Sodium: 133 mmol/L — ABNORMAL LOW (ref 135–145)
Total Protein: 6.8 g/dL (ref 6.0–8.3)

## 2014-08-30 LAB — PROTIME-INR
INR: 1.04 (ref 0.00–1.49)
PROTHROMBIN TIME: 13.7 s (ref 11.6–15.2)

## 2014-08-30 MED ORDER — INSULIN ASPART 100 UNIT/ML IV SOLN
10.0000 [IU] | Freq: Once | INTRAVENOUS | Status: AC
  Administered 2014-08-30: 10 [IU] via INTRAVENOUS

## 2014-08-30 MED ORDER — DEXTROSE 50 % IV SOLN
1.0000 | Freq: Once | INTRAVENOUS | Status: AC
  Administered 2014-08-30: 50 mL via INTRAVENOUS

## 2014-08-30 MED ORDER — FUROSEMIDE 10 MG/ML IJ SOLN
20.0000 mg | Freq: Once | INTRAMUSCULAR | Status: AC
  Administered 2014-08-30: 20 mg via INTRAVENOUS
  Filled 2014-08-30: qty 2

## 2014-08-30 MED ORDER — SODIUM CHLORIDE 0.9 % IV SOLN
1.0000 g | Freq: Once | INTRAVENOUS | Status: AC
  Administered 2014-08-30: 1 g via INTRAVENOUS
  Filled 2014-08-30: qty 10

## 2014-08-30 MED ORDER — ALBUTEROL SULFATE (2.5 MG/3ML) 0.083% IN NEBU
10.0000 mg | INHALATION_SOLUTION | Freq: Once | RESPIRATORY_TRACT | Status: AC
  Administered 2014-08-30: 10 mg via RESPIRATORY_TRACT
  Filled 2014-08-30: qty 12

## 2014-08-30 MED ORDER — SODIUM CHLORIDE 0.9 % IV BOLUS (SEPSIS)
500.0000 mL | Freq: Once | INTRAVENOUS | Status: AC
  Administered 2014-08-30: 500 mL via INTRAVENOUS

## 2014-08-30 MED ORDER — SODIUM POLYSTYRENE SULFONATE 15 GM/60ML PO SUSP
15.0000 g | Freq: Once | ORAL | Status: AC
  Administered 2014-08-30: 15 g via ORAL
  Filled 2014-08-30: qty 60

## 2014-08-30 NOTE — ED Notes (Addendum)
,   onset today.blood from penis, Pt has a ureterostomy , bladder removed  2-3 years ago  No fever , no chills , Takes aspirin 81 daily

## 2014-08-30 NOTE — ED Provider Notes (Signed)
CSN: [id number]     Arrival date & time 08/30/14  1721 History   First MD Initiated Contact with Patient 08/30/14 1858     Chief Complaint  Patient presents with  . Penile Discharge     (Consider location/radiation/quality/duration/timing/severity/associated sxs/prior Treatment) HPI Comments: Patient presents to the ER for evaluation of bleeding from his penis. Patient reports that he had onset of bleeding from his penis while eating dinner this evening. He reports that he has been continuously passing blood and clots since that time. Patient denies any trauma. He has not had any pain or fever. Patient is status post radical cystectomy and ileal loop conduit urinary diversion in 2014. He reports normal urinary output from ostomy, no blood in the urine.   Patient is a 64 y.o. male presenting with penile discharge.  Penile Discharge    Past Medical History  Diagnosis Date  . Mixed hyperlipidemia   . 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 HERNIA, VENTRAL   . CAROTID BRUIT   . Coronary artery disease     h/o MI and bypass surgery x 3 in 2703 complicated by sternal wound infection, sees Dr. Johnsie Cancel  . Vein symptom     injury to left leg due to conveyor belt.  caused injury to veins in left leg/ s/p surgery   . Sinus congestion     has allergies  . AODM   . Hiatal hernia     s/p hiatal hernia surgery 2010  . Anxiety     history of panic attacks  . Depression     takes celexa  . Atrial flutter 11/19/2011  . Atrial fibrillation   . Open abdominal wall wound   . Cancer      kidney ca, also mass to left neck  . Lymphoma   . Bladder cancer   . Prostate cancer   . Non Hodgkin's lymphoma   . CKD (chronic kidney disease), stage III     has one kidney.  had left kidney removed in 2011 for cancer  . Chronic kidney disease, stage III (moderate)   . Acute renal failure 04/06/2013    With hyperkalemia and met acidosis   Past  Surgical History  Procedure Laterality Date  . Cholecystectomy    . Kidney surgery    . Hernia repair      2010  . Coronary artery bypass graft      x3 .  Dr. Florence Canner is present cardiologist  . Vein surgery      s/p injury to left leg from conveyor belt  . Cardiac catheterization      2009  . Portacath placement    . Tee without cardioversion  01/07/2012    Procedure: TRANSESOPHAGEAL ECHOCARDIOGRAM (TEE);  Surgeon: Fay Records, MD;  Location: [facility] ENDOSCOPY;  Service: Cardiovascular;  Laterality: N/A;  . Atrial flutter ablation  01/07/12    CTI ablation by Dr Rayann Heman  . Bladder surgery    . Prostate surgery    . Temporary pacemaker insertion    . Atrial flutter ablation N/A 01/07/2012    Procedure: ATRIAL FLUTTER ABLATION;  Surgeon: Thompson Grayer, MD;  Location: [facility] CATH LAB;  Service: Cardiovascular;  Laterality: N/A;  . Bladder surgery     History reviewed. No pertinent family history. History  Substance Use Topics  . Smoking status: Former Smoker -- 1.00 packs/day for 40 years    Types: Cigarettes  Quit date: 11/22/2009  . Smokeless tobacco: Never Used  . Alcohol Use: No     Comment: has not drank since 1991    Review of Systems  Genitourinary: Positive for discharge (blood).  All other systems reviewed and are negative.     Allergies  Penicillins and Sulfonamide derivatives  Home Medications   Prior to Admission medications   Medication Sig Start Date End Date Taking? Authorizing Provider  ALPRAZolam (XANAX) 1 MG tablet TAKE ONE TABLET AT BEDTIME AS NEEDED Patient taking differently: Take 1 mg by mouth at bedtime as needed for anxiety or sleep.  06/10/14  Yes Mary-Margaret Hassell Done, FNP  amLODipine (NORVASC) 5 MG tablet Take 1 tablet (5 mg total) by mouth every morning. 06/10/14  Yes Mary-Margaret Hassell Done, FNP  aspirin 81 MG tablet Take 81 mg by mouth daily.  11/11/13  Yes Historical Provider, MD  atorvastatin (LIPITOR) 40 MG tablet Take 1 tablet (40 mg total) by mouth  every morning. 06/10/14  Yes Mary-Margaret Hassell Done, FNP  citalopram (CELEXA) 20 MG tablet Take 1 tablet (20 mg total) by mouth every evening. 06/10/14  Yes Mary-Margaret Hassell Done, FNP  furosemide (LASIX) 20 MG tablet Take 1 tablet (20 mg total) by mouth every morning. 06/10/14  Yes Mary-Margaret Hassell Done, FNP  glimepiride (AMARYL) 4 MG tablet Take 1 tablet (4 mg total) by mouth daily before breakfast. 06/10/14  Yes Mary-Margaret Hassell Done, FNP  HYDROcodone-acetaminophen (NORCO/VICODIN) 5-325 MG per tablet Take 1 tablet by mouth every 6 (six) hours as needed. For pain Patient taking differently: Take 1 tablet by mouth every 6 (six) hours as needed for moderate pain or severe pain. For pain 06/10/14  Yes Mary-Margaret Hassell Done, FNP  Insulin Glargine (TOUJEO SOLOSTAR) 300 UNIT/ML SOPN Inject 60 Units into the skin daily. BID Patient taking differently: Inject 60 Units into the skin at bedtime.  06/10/14  Yes Mary-Margaret Hassell Done, FNP  isosorbide mononitrate (IMDUR) 30 MG 24 hr tablet Take 1 tablet (30 mg total) by mouth daily. 06/10/14  Yes Mary-Margaret Hassell Done, FNP  lisinopril (PRINIVIL,ZESTRIL) 20 MG tablet Take 1 tablet (20 mg total) by mouth every morning. 06/10/14  Yes Mary-Margaret Hassell Done, FNP  sodium bicarbonate 650 MG tablet Take 2 tablets (1,300 mg total) by mouth 3 (three) times daily. 06/10/14  Yes Mary-Margaret Hassell Done, FNP  azithromycin (ZITHROMAX Z-PAK) 250 MG tablet As directed Patient not taking: Reported on 08/30/2014 06/21/14   Mary-Margaret Hassell Done, FNP  Cholecalciferol (VITAMIN D3) 5000 UNITS CAPS Take 1 capsule by mouth daily. 06/03/14   Historical Provider, MD   BP 144/80 mmHg  Pulse 79  Temp(Src) 98 F (36.7 C) (Oral)  Resp 21  Ht 6' (1.829 m)  Wt 250 lb (113.399 kg)  BMI 33.90 kg/m2  SpO2 96% Physical Exam  Constitutional: He is oriented to person, place, and time. He appears well-developed and well-nourished. No distress.  HENT:  Head: Normocephalic and atraumatic.  Right Ear:  Hearing normal.  Left Ear: Hearing normal.  Nose: Nose normal.  Mouth/Throat: Oropharynx is clear and moist and mucous membranes are normal.  Eyes: Conjunctivae and EOM are normal. Pupils are equal, round, and reactive to light.  Neck: Normal range of motion. Neck supple.  Cardiovascular: Regular rhythm, S1 normal and S2 normal.  Exam reveals no gallop and no friction rub.   No murmur heard. Pulmonary/Chest: Effort normal and breath sounds normal. No respiratory distress. He exhibits no tenderness.  Abdominal: Soft. Normal appearance and bowel sounds are normal. There is no hepatosplenomegaly. There is no tenderness. There  is no rebound, no guarding, no tenderness at McBurney's point and negative Murphy's sign. No hernia.  Genitourinary:  Blood actively oozing from meatus with penis, no trauma  Musculoskeletal: Normal range of motion.  Neurological: He is alert and oriented to person, place, and time. He has normal strength. No cranial nerve deficit or sensory deficit. Coordination normal. GCS eye subscore is 4. GCS verbal subscore is 5. GCS motor subscore is 6.  Skin: Skin is warm, dry and intact. No rash noted. No cyanosis.  Psychiatric: He has a normal mood and affect. His speech is normal and behavior is normal. Thought content normal.  Nursing note and vitals reviewed.   ED Course  Procedures (including critical care time) Labs Review Labs Reviewed  CBC WITH DIFFERENTIAL/PLATELET - Abnormal; Notable for the following:    RBC 3.65 (*)    Hemoglobin 11.2 (*)    HCT 33.2 (*)    All other components within normal limits  COMPREHENSIVE METABOLIC PANEL - Abnormal; Notable for the following:    Sodium 133 (*)    Potassium 6.5 (*)    Chloride 114 (*)    Glucose, Bld 281 (*)    BUN 62 (*)    Creatinine, Ser 1.97 (*)    GFR calc non Af Amer 34 (*)    GFR calc Af Amer 39 (*)    Anion gap 0 (*)    All other components within normal limits  PROTIME-INR    Imaging Review Ct Renal  Stone Study  08/30/2014   CLINICAL DATA:  Bleeding from penis beginning today, prior cystectomy, ureterostomy, lymphoma, prostate cancer, bladder cancer, acute renal failure and chronic kidney disease  EXAM: CT ABDOMEN AND PELVIS WITHOUT CONTRAST  TECHNIQUE: Multidetector CT imaging of the abdomen and pelvis was performed following the standard protocol without IV contrast. Sagittal and coronal MPR images reconstructed from axial data set. Oral contrast not administered.  COMPARISON:  06/29/2013  FINDINGS: Minimal atelectasis at LEFT lung base posteriorly.  Pacemaker leads RIGHT atrium and RIGHT ventricle.  Scattered atherosclerotic calcifications aorta and coronary arteries.  Post cholecystectomy, LEFT nephrectomy, cystectomy, prostatectomy.  Within limits of a nonenhanced exam, liver, spleen, pancreas, and LEFT adrenal gland normal.  Diffuse nodular thickening and enlargement of RIGHT adrenal gland new since previous exam, containing at least 1 and potentially 2 RIGHT adrenal nodules up to 2.9 cm diameter.  RIGHT kidney unremarkable with RIGHT ureter extending to a RIGHT lower quadrant urinary diversion.  Normal appendix.  Stomach remaining bowel loops unremarkable.  Single enlarged LEFT external iliac lymph node 12 mm diameter image 78.  No additional mass or adenopathy.  No free air for, free fluid, or acute inflammatory process.  Scattered degenerative disc and facet disease changes lumbar spine without acute osseous findings.  IMPRESSION: No definite acute intra-abdominal or intrapelvic abnormalities.  New enlargement of the RIGHT adrenal gland containing at least 1 and potentially 2 adrenal nodules, largest 2.9 cm diameter ; followup MR characterization with in phase and out of phase imaging recommended.  Single minimally enlarged LEFT external iliac lymph node image 78.  No cause for blood from the urethral meatus identified.   Electronically Signed   By: Lavonia Dana M.D.   On: 08/30/2014 20:44     EKG  Interpretation   Date/Time:  Friday August 30 2014 19:31:39 EST Ventricular Rate:  83 PR Interval:  216 QRS Duration: 187 QT Interval:  441 QTC Calculation: 518 R Axis:   -34 Text Interpretation:  Sinus rhythm  atrial-sensed ventricular-paced  complexes Confirmed by Betsey Holiday  MD, [place] [id number]) on 08/30/2014  8:40:28 PM      MDM   Final diagnoses:  Urethral bleeding  Hyperkalemia  Renal insufficiency    Patient presents to the ER for evaluation of bleeding from his penis. Symptoms began this evening. He denies any trauma. He has not on any blood thinners other than aspirin 81 mg daily. Patient does have a significant urologic history. He is status post left nephrectomy and radical cystectomy/prostatectomy. He has had a previous bladder cancer and prostate cancer. Patient has had previous  ileal loop conduit diversion surgery. [name] stoma appears healthy and his bag is collecting clear urine.  Examination revealed no external trauma to the penis, but he does have continuous oozing of blood from the meatus. This is concerning for lesion in the urethra, possible recurrence of cancer.  Case discussed with Dr. Ria Clock, on call for urology at this hospital. He agrees to evaluate the patient if the patient was transferred to [facility], but based on the other medical conditions, patient would be better served by medicine service. Patient was discussed with Dr. Genene Churn, hospitalist. He recommended the patient be transferred to the emergency department to be reevaluated, at which time it can be determined what level of care he needs for inpatient bed. Case was discussed with Dr. Judieth Keens, in the emergency room. Report was given, patient will be transferred back and buttocks. He is stable for transfer.     Orpah Greek, MD 08/30/14 2200

## 2014-08-30 NOTE — ED Notes (Signed)
Daughter-Amber [phone]

## 2014-08-30 NOTE — ED Notes (Signed)
Pt states he was eating dinner and his penis began having bright red bleeding and passing large clots.

## 2014-08-30 NOTE — ED Notes (Addendum)
K is  6.5  Dr Waverly Ferrari notified

## 2014-09-05 HISTORY — PX: BLADDER SURGERY: SHX569

## 2014-09-11 ENCOUNTER — Ambulatory Visit (INDEPENDENT_AMBULATORY_CARE_PROVIDER_SITE_OTHER): Payer: Commercial Managed Care - HMO | Admitting: Nurse Practitioner

## 2014-09-11 ENCOUNTER — Encounter: Payer: Self-pay | Admitting: Nurse Practitioner

## 2014-09-11 VITALS — BP 129/72 | HR 86 | Temp 97.7°F | Ht 72.0 in | Wt 267.0 lb

## 2014-09-11 DIAGNOSIS — E1165 Type 2 diabetes mellitus with hyperglycemia: Secondary | ICD-10-CM | POA: Diagnosis not present

## 2014-09-11 DIAGNOSIS — I25119 Atherosclerotic heart disease of native coronary artery with unspecified angina pectoris: Secondary | ICD-10-CM

## 2014-09-11 DIAGNOSIS — E785 Hyperlipidemia, unspecified: Secondary | ICD-10-CM

## 2014-09-11 DIAGNOSIS — F418 Other specified anxiety disorders: Secondary | ICD-10-CM

## 2014-09-11 DIAGNOSIS — I10 Essential (primary) hypertension: Secondary | ICD-10-CM

## 2014-09-11 LAB — POCT GLYCOSYLATED HEMOGLOBIN (HGB A1C): Hemoglobin A1C: 8.6

## 2014-09-11 MED ORDER — FUROSEMIDE 20 MG PO TABS: 20.0000 mg | ORAL_TABLET | Freq: Every morning | ORAL | Status: DC

## 2014-09-11 MED ORDER — ALPRAZOLAM 1 MG PO TABS: 1.0000 mg | ORAL_TABLET | Freq: Every evening | ORAL | Status: DC | PRN

## 2014-09-11 MED ORDER — ISOSORBIDE MONONITRATE ER 30 MG PO TB24: 30.0000 mg | ORAL_TABLET | Freq: Every day | ORAL | Status: DC

## 2014-09-11 MED ORDER — LISINOPRIL 20 MG PO TABS: 20.0000 mg | ORAL_TABLET | Freq: Every day | ORAL | Status: DC

## 2014-09-11 MED ORDER — CITALOPRAM HYDROBROMIDE 20 MG PO TABS: 20.0000 mg | ORAL_TABLET | Freq: Every evening | ORAL | Status: DC

## 2014-09-11 MED ORDER — INSULIN GLARGINE 300 UNIT/ML ~~LOC~~ SOPN: 65.0000 [IU] | PEN_INJECTOR | Freq: Every day | SUBCUTANEOUS | Status: DC

## 2014-09-11 MED ORDER — GLIMEPIRIDE 4 MG PO TABS: 4.0000 mg | ORAL_TABLET | Freq: Every day | ORAL | Status: DC

## 2014-09-11 MED ORDER — ATORVASTATIN CALCIUM 40 MG PO TABS: 80.0000 mg | ORAL_TABLET | Freq: Every day | ORAL | Status: DC

## 2014-09-11 NOTE — Progress Notes (Signed)
Subjective:    Patient ID: Frederick Foster, male    DOB: 10-Oct-1950, 64 y.o.   MRN: [id number]  HPI Patient is here today for chronic disease follow up. He went to the hospital last week for a shoulder pain and SOB. He reports he had a "heart attack". He reports history of open heart surgery. He reports feeling sore but doing wells. He will be following up with a cardiologist at [facility], Dr. Nevada Crane.  Blood sugars are elevated.  Patient Active Problem List   Diagnosis Date Noted  . Sick sinus syndrome 05/21/2013  . Cardiac pacemaker in situ 04/07/2013  . Hyperkalemia 04/07/2013  . Bladder cancer 02/23/2013  . Arteriosclerosis of coronary artery 01/15/2013  . Depression with anxiety 10/23/2012  . Chronic diastolic CHF (congestive heart failure) 04/26/2012  . CKD (chronic kidney disease), stage III 04/26/2012  . CHB (complete heart block) 04/25/2012  . Acute on chronic diastolic heart failure 81/85/6314  . Atrial flutter 11/19/2011  . Lymphoma 06/22/2011  . DM (diabetes mellitus), type 2, uncontrolled 05/17/2011  . Solitary kidney 05/16/2011  . Hyponatremia 05/16/2011  . HTN (hypertension) 05/13/2011  . Hyperlipidemia with target LDL less than 100 10/22/2008  . HERNIA, VENTRAL 10/22/2008  . Carotid bruit 10/22/2008  . CORONARY ARTERY BYPASS GRAFT, TWO VESSEL, HX OF 10/22/2008    Current Outpatient Prescriptions on File Prior to Visit  Medication Sig Dispense Refill  . ALPRAZolam (XANAX) 1 MG tablet TAKE ONE TABLET AT BEDTIME AS NEEDED (Patient taking differently: Take 1 mg by mouth at bedtime as needed for anxiety or sleep. ) 90 tablet 1  . aspirin 81 MG tablet Take 81 mg by mouth daily.     . Cholecalciferol (VITAMIN D3) 5000 UNITS CAPS Take 1 capsule by mouth daily.    . citalopram (CELEXA) 20 MG tablet Take 1 tablet (20 mg total) by mouth every evening. 90 tablet 1  . furosemide (LASIX) 20 MG tablet Take 1 tablet (20 mg total) by mouth every morning. 90 tablet 1  . Insulin  Glargine (TOUJEO SOLOSTAR) 300 UNIT/ML SOPN Inject 60 Units into the skin daily. BID (Patient taking differently: Inject 60 Units into the skin at bedtime. ) 6 pen 5  . sodium bicarbonate 650 MG tablet Take 2 tablets (1,300 mg total) by mouth 3 (three) times daily. (Patient taking differently: Take 1,300 mg by mouth 4 (four) times daily. ) 180 tablet 3   Current Facility-Administered Medications on File Prior to Visit  Medication Dose Route Frequency Provider Last Rate Last Dose  . 0.9 %  sodium chloride infusion  250 mL Intravenous PRN Thompson Grayer, MD      . benzocaine (HURRICAINE) 20 % mouth spray 1 application  1 application Mouth/Throat PRN Thompson Grayer, MD      . fentaNYL (SUBLIMAZE) injection 250 mcg  250 mcg Intravenous Once Thompson Grayer, MD      . midazolam (VERSED) injection 10 mg  10 mg Intravenous Once Thompson Grayer, MD          Review of Systems  Constitutional: Negative.   HENT: Negative.   Eyes: Negative.   Respiratory: Negative.   Cardiovascular: Negative.   Gastrointestinal: Negative.   Endocrine: Negative.   Genitourinary: Negative.   Musculoskeletal: Negative.   Allergic/Immunologic: Negative.   Neurological: Negative.   Hematological: Negative.   Psychiatric/Behavioral: Negative.   All other systems reviewed and are negative.      Objective:   Physical Exam  Constitutional: He is oriented to person,  place, and time. He appears well-developed and well-nourished.  HENT:  Head: Normocephalic and atraumatic.  Eyes: Pupils are equal, round, and reactive to light.  Neck: Normal range of motion.  Cardiovascular: Normal rate and regular rhythm.   Pulmonary/Chest: Effort normal.  Shortness of breath.   Abdominal: Soft.  Genitourinary:  Urostomy bag in place.   Musculoskeletal: Normal range of motion.  Neurological: He is alert and oriented to person, place, and time.  Skin: Skin is warm.  Psychiatric: He has a normal mood and affect.      BP 129/72 mmHg   Pulse 86  Temp(Src) 97.7 F (36.5 C) (Oral)  Ht 6' (1.829 m)  Wt 267 lb (121.11 kg)  BMI 36.20 kg/m2  Results for orders placed or performed in visit on 09/11/14  POCT glycosylated hemoglobin (Hb A1C)  Result Value Ref Range   Hemoglobin A1C 8.6%        Assessment & Plan:   1. Hyperlipidemia with target LDL less than 100   2. Essential hypertension   3. Type 2 diabetes mellitus with hyperglycemia   4. Depression with anxiety   5. Coronary artery disease involving native coronary artery of native heart with angina pectoris    Meds ordered this encounter  Medications  . nitroGLYCERIN (NITROSTAT) 0.4 MG SL tablet    Sig: Place 0.4 mg under the tongue.  . carvedilol (COREG) 3.125 MG tablet    Sig: Take 3.125 mg by mouth.  . DISCONTD: atorvastatin (LIPITOR) 40 MG tablet    Sig: Take 80 mg by mouth.  Marland Kitchen amLODipine (NORVASC) 10 MG tablet    Sig: Take 10 mg by mouth.  . clopidogrel (PLAVIX) 75 MG tablet    Sig: Take 75 mg by mouth.  Marland Kitchen HYDROcodone-acetaminophen (NORCO/VICODIN) 5-325 MG per tablet    Sig: Take 1 tablet by mouth.  . DISCONTD: glimepiride (AMARYL) 4 MG tablet    Sig: Take 4 mg by mouth.  . DISCONTD: lisinopril (PRINIVIL,ZESTRIL) 20 MG tablet    Sig: Take 20 mg by mouth.  . DISCONTD: furosemide (LASIX) 20 MG tablet    Sig: Take 20 mg by mouth.  . DISCONTD: isosorbide mononitrate (IMDUR) 30 MG 24 hr tablet    Sig: Take 30 mg by mouth.  . Insulin Glargine (TOUJEO SOLOSTAR) 300 UNIT/ML SOPN    Sig: Inject 65 Units into the skin daily. BID    Dispense:  6 pen    Refill:  5    Order Specific Question:  Supervising Provider    Answer:  Chipper Herb [[id number]]  . atorvastatin (LIPITOR) 40 MG tablet    Sig: Take 2 tablets (80 mg total) by mouth daily at 6 PM.    Dispense:  90 tablet    Refill:  1    Order Specific Question:  Supervising Provider    Answer:  Chipper Herb [[id number]]  . ALPRAZolam (XANAX) 1 MG tablet    Sig: Take 1 tablet (1 mg total) by mouth at  bedtime as needed for anxiety or sleep.    Dispense:  90 tablet    Refill:  1    Order Specific Question:  Supervising Provider    Answer:  Chipper Herb [[id number]]  . citalopram (CELEXA) 20 MG tablet    Sig: Take 1 tablet (20 mg total) by mouth every evening.    Dispense:  90 tablet    Refill:  1    Order Specific Question:  Supervising Provider    Answer:  Redge Gainer W [[id number]]  . furosemide (LASIX) 20 MG tablet    Sig: Take 1 tablet (20 mg total) by mouth every morning.    Dispense:  90 tablet    Refill:  1    Order Specific Question:  Supervising Provider    Answer:  Chipper Herb [[id number]]  . glimepiride (AMARYL) 4 MG tablet    Sig: Take 1 tablet (4 mg total) by mouth daily with breakfast.    Dispense:  90 tablet    Refill:  1    Order Specific Question:  Supervising Provider    Answer:  Chipper Herb [[id number]]  . isosorbide mononitrate (IMDUR) 30 MG 24 hr tablet    Sig: Take 1 tablet (30 mg total) by mouth daily.    Dispense:  90 tablet    Refill:  1    Order Specific Question:  Supervising Provider    Answer:  Chipper Herb [[id number]]  . lisinopril (PRINIVIL,ZESTRIL) 20 MG tablet    Sig: Take 1 tablet (20 mg total) by mouth daily.    Dispense:  90 tablet    Refill:  1    Order Specific Question:  Supervising Provider    Answer:  Chipper Herb [[id number]]   Orders Placed This Encounter  Procedures  . CMP14+EGFR  . NMR, lipoprofile  . POCT glycosylated hemoglobin (Hb A1C)   Increase tujeo to 65u daily 'keep diary of blood sugars Watch diet Daily exercise- walking Health maintenace reviewed Continue all meds hemoccult cards given to patient with directions  rto in 3 months and prn

## 2014-09-11 NOTE — Patient Instructions (Signed)
Diabetes and Exercise Exercising regularly is important. It is not just about losing weight. It has many health benefits, such as:  Improving your overall fitness, flexibility, and endurance.  Increasing your bone density.  Helping with weight control.  Decreasing your body fat.  Increasing your muscle strength.  Reducing stress and tension.  Improving your overall health. People with diabetes who exercise gain additional benefits because exercise:  Reduces appetite.  Improves the body's use of blood sugar (glucose).  Helps lower or control blood glucose.  Decreases blood pressure.  Helps control blood lipids (such as cholesterol and triglycerides).  Improves the body's use of the hormone insulin by:  Increasing the body's insulin sensitivity.  Reducing the body's insulin needs.  Decreases the risk for heart disease because exercising:  Lowers cholesterol and triglycerides levels.  Increases the levels of good cholesterol (such as high-density lipoproteins [HDL]) in the body.  Lowers blood glucose levels. YOUR ACTIVITY PLAN  Choose an activity that you enjoy and set realistic goals. Your health care provider or diabetes educator can help you make an activity plan that works for you. Exercise regularly as directed by your health care provider. This includes:  Performing resistance training twice a week such as push-ups, sit-ups, lifting weights, or using resistance bands.  Performing 150 minutes of cardio exercises each week such as walking, running, or playing sports.  Staying active and spending no more than 90 minutes at one time being inactive. Even short bursts of exercise are good for you. Three 10-minute sessions spread throughout the day are just as beneficial as a single 30-minute session. Some exercise ideas include:  Taking the dog for a walk.  Taking the stairs instead of the elevator.  Dancing to your favorite song.  Doing an exercise  video.  Doing your favorite exercise with a friend. RECOMMENDATIONS FOR EXERCISING WITH TYPE 1 OR TYPE 2 DIABETES   Check your blood glucose before exercising. If blood glucose levels are greater than 240 mg/dL, check for urine ketones. Do not exercise if ketones are present.  Avoid injecting insulin into areas of the body that are going to be exercised. For example, avoid injecting insulin into:  The arms when playing tennis.  The legs when jogging.  Keep a record of:  Food intake before and after you exercise.  Expected peak times of insulin action.  Blood glucose levels before and after you exercise.  The type and amount of exercise you have done.  Review your records with your health care provider. Your health care provider will help you to develop guidelines for adjusting food intake and insulin amounts before and after exercising.  If you take insulin or oral hypoglycemic agents, watch for signs and symptoms of hypoglycemia. They include:  Dizziness.  Shaking.  Sweating.  Chills.  Confusion.  Drink plenty of water while you exercise to prevent dehydration or heat stroke. Body water is lost during exercise and must be replaced.  Talk to your health care provider before starting an exercise program to make sure it is safe for you. Remember, almost any type of activity is better than none. Document Released: 09/18/2003 Document Revised: 11/12/2013 Document Reviewed: 12/05/2012 ExitCare Patient Information 2015 [employer]. This information is not intended to replace advice given to you by your health care provider. Make sure you discuss any questions you have with your health care provider.  

## 2014-09-12 LAB — NMR, LIPOPROFILE
Cholesterol: 114 mg/dL (ref 100–199)
HDL CHOLESTEROL BY NMR: 35 mg/dL — AB (ref >39)
HDL PARTICLE NUMBER: 22.4 umol/L — AB (ref >=30.5)
LDL PARTICLE NUMBER: 584 nmol/L (ref <1000)
LDL Size: 20.4 nm (ref >20.5)
LDL-C: 57 mg/dL (ref 0–99)
LP-IR SCORE: 45 (ref <=45)
Small LDL Particle Number: 264 nmol/L (ref <=527)
Triglycerides by NMR: 108 mg/dL (ref 0–149)

## 2014-09-12 LAB — CMP14+EGFR
ALT: 19 IU/L (ref 0–44)
AST: 18 IU/L (ref 0–40)
Albumin/Globulin Ratio: 1.4 (ref 1.1–2.5)
Albumin: 3.6 g/dL (ref 3.6–4.8)
Alkaline Phosphatase: 78 IU/L (ref 39–117)
BILIRUBIN TOTAL: 0.4 mg/dL (ref 0.0–1.2)
BUN/Creatinine Ratio: 23 — ABNORMAL HIGH (ref 10–22)
BUN: 50 mg/dL — AB (ref 8–27)
CHLORIDE: 101 mmol/L (ref 97–108)
CO2: 21 mmol/L (ref 18–29)
CREATININE: 2.2 mg/dL — AB (ref 0.76–1.27)
Calcium: 8.5 mg/dL — ABNORMAL LOW (ref 8.6–10.2)
GFR calc Af Amer: 35 mL/min/{1.73_m2} — ABNORMAL LOW (ref >59)
GFR calc non Af Amer: 30 mL/min/{1.73_m2} — ABNORMAL LOW (ref >59)
Globulin, Total: 2.5 g/dL (ref 1.5–4.5)
Glucose: 184 mg/dL — ABNORMAL HIGH (ref 65–99)
Potassium: 4.4 mmol/L (ref 3.5–5.2)
Sodium: 137 mmol/L (ref 134–144)
TOTAL PROTEIN: 6.1 g/dL (ref 6.0–8.5)

## 2014-09-13 ENCOUNTER — Encounter: Payer: Self-pay | Admitting: Nurse Practitioner

## 2014-09-17 ENCOUNTER — Other Ambulatory Visit: Payer: Commercial Managed Care - HMO

## 2014-09-17 DIAGNOSIS — Z1212 Encounter for screening for malignant neoplasm of rectum: Secondary | ICD-10-CM

## 2014-09-17 NOTE — Progress Notes (Signed)
Lab only 

## 2014-09-19 LAB — FECAL OCCULT BLOOD, IMMUNOCHEMICAL: FECAL OCCULT BLD: NEGATIVE

## 2014-09-20 ENCOUNTER — Ambulatory Visit (INDEPENDENT_AMBULATORY_CARE_PROVIDER_SITE_OTHER): Payer: Commercial Managed Care - HMO | Admitting: Nurse Practitioner

## 2014-09-20 ENCOUNTER — Encounter: Payer: Self-pay | Admitting: Nurse Practitioner

## 2014-09-20 VITALS — BP 111/66 | HR 74 | Temp 97.1°F | Ht 72.0 in | Wt 264.0 lb

## 2014-09-20 DIAGNOSIS — Z09 Encounter for follow-up examination after completed treatment for conditions other than malignant neoplasm: Secondary | ICD-10-CM | POA: Diagnosis not present

## 2014-09-20 DIAGNOSIS — I5032 Chronic diastolic (congestive) heart failure: Secondary | ICD-10-CM

## 2014-09-20 MED ORDER — SIMVASTATIN 40 MG PO TABS
40.0000 mg | ORAL_TABLET | Freq: Every day | ORAL | Status: DC
Start: 2014-09-20 — End: 2014-12-13

## 2014-09-20 NOTE — Progress Notes (Signed)
   Subjective:    Patient ID: Frederick Foster, male    DOB: 04/16/1951, 64 y.o.   MRN: [id number]  HPI The patient was seen last week for a hospital visit follow up and experienced MI type symptoms and was admitted to the hospital for 3 days with a diagnosis of fluid overload related to heart failure-21 pounds of fluid was removed with diuretic therapy.  The patient will follow up with his cardiologist -Dr. Nevada Crane on 10/11/2014. The patient states he has fatigue but is recovering well, with no complaints today.   Review of Systems  Constitutional: Positive for fatigue. Negative for unexpected weight change.  Respiratory: Negative for cough, chest tightness and shortness of breath.   Cardiovascular: Negative for chest pain, palpitations and leg swelling.  All other systems reviewed and are negative.      Objective:   Physical Exam  Constitutional: He is oriented to person, place, and time. He appears well-developed and well-nourished.  Cardiovascular: Normal rate, regular rhythm, normal heart sounds and intact distal pulses.   No murmur heard. Pulmonary/Chest: Effort normal and breath sounds normal. No respiratory distress. He has no rales.  Musculoskeletal: He exhibits no edema.  Neurological: He is alert and oriented to person, place, and time.  Skin: Skin is warm and dry.    BP 111/66 mmHg  Pulse 74  Temp(Src) 97.1 F (36.2 C) (Oral)  Ht 6' (1.829 m)  Wt 264 lb (119.75 kg)  BMI 35.80 kg/m2        Assessment & Plan:   1. Hospital discharge follow-up   2. Chronic diastolic CHF (congestive heart failure)    Limit fluid intake RTO prn  Mary-Margaret Hassell Done, FNP

## 2014-09-20 NOTE — Addendum Note (Signed)
Addended by: Chevis Pretty on: 09/20/2014 03:19 PM   Modules accepted: Orders, Medications

## 2014-09-20 NOTE — Patient Instructions (Signed)

## 2014-11-20 ENCOUNTER — Telehealth: Payer: Self-pay | Admitting: Nurse Practitioner

## 2014-11-20 DIAGNOSIS — I10 Essential (primary) hypertension: Secondary | ICD-10-CM

## 2014-11-20 NOTE — Patient Outreach (Signed)
[facility]) Care Management  11/20/2014  Frederick Foster 09/30/1950 [id number]   Referral from [facility] assigned to Mariann Laster, RN.  [name]. [place] CM Assistant Phone: [phone] Fax: [phone]

## 2014-11-21 ENCOUNTER — Other Ambulatory Visit: Payer: Self-pay

## 2014-11-21 MED ORDER — CARVEDILOL 3.125 MG PO TABS: 3.1250 mg | ORAL_TABLET | Freq: Two times a day (BID) | ORAL | Status: DC

## 2014-11-21 MED ORDER — HYDROCODONE-ACETAMINOPHEN 5-325 MG PO TABS: 1.0000 | ORAL_TABLET | Freq: Four times a day (QID) | ORAL | Status: DC | PRN

## 2014-11-21 NOTE — Telephone Encounter (Signed)
Patient picked up rx.

## 2014-11-21 NOTE — Telephone Encounter (Signed)
Pt aware written Rx ready at front office ready for pickup

## 2014-11-21 NOTE — Patient Outreach (Signed)
[facility]) Care Management  11/21/2014  SHARVIL HOEY 31-Dec-1950 [id number]    Referral Date:  11/20/14 Referral Source:  [facility] [id number] Issue:  Renal failure, cancer, (bladder, prostate, renal cell carcinoma, non-hodgkin's lymphoma; CAD post CABG, HTN, CHF, DM, h/o MI.  H/o IP admissions:  5 Most recent admission to [facility] 10/22/14 - 11/06/14 due to perineal wound infection, wound debridement and drainage.  Admission complicated by hypotension, renal failure with acute metabolic acidosis.  Treated with CRRT [continuous renal replacement therapy] and transitioned to hemodialysis.  Refused recommended SNF at discharge. PCP:  Dr. Redge Gainer  Triage Date:  Pending contact  Current contact information:  [address] [place], [place] [address] [place] [phone] home [phone] mobile   RN CM attempted outreach call #1.  Patient not reached.  HIPAA compliant voice message left on answering machine.  RN CM will reschedule for next attempt.   Mariann Laster, RN, BSN, [facility], CCM  [employer] Management Coordinator [phone] Office [phone] Direct [phone] Cell

## 2014-11-21 NOTE — Telephone Encounter (Signed)
rx ready for pickup 

## 2014-11-25 ENCOUNTER — Other Ambulatory Visit: Payer: Self-pay

## 2014-11-25 NOTE — Patient Outreach (Signed)
[facility]) Care Management  11/25/2014  JADARRIUS MASELLI December 22, 1950 [id number]  Initial Assessment sent to primary MD, Dr. Redge Gainer via [place], RN, BSN, [facility], [place] Management Care Management Coordinator [phone] Office [phone] Direct [phone] Cell

## 2014-11-25 NOTE — Patient Outreach (Signed)
Frederick Foster) Care Management  11/25/2014  Frederick Foster [birth year] [id number]   Referral Date: 11/20/14 Referral Source: Frederick Foster [facility] [id number] Issue: Renal failure, cancer, (bladder, prostate, renal cell carcinoma, non-hodgkin's lymphoma; CAD post CABG, HTN, CHF, DM, h/o MI.  H/o IP admissions: 5 Most recent admission to [facility] 10/22/14 - 11/06/14 due to perineal wound infection, wound debridement and drainage. Admission complicated by hypotension, renal failure with acute metabolic acidosis. Treated with CRRT [continuous renal replacement therapy] and transitioned to hemodialysis. Refused recommended SNF at discharge. Triage Screening and Initial Assessment Date:  11/25/2014  Interdisciplinary Team: Dr. Redge Foster - next appt first week of June 2016 Cardiologist: Heart Pacer check scheduled for 12/26/2014 [facility]: Surgeon:  Dr. Tresa Foster Hemodialysis Tuesday - Thursday - Saturday at [facility] in [place], [place]:   Married and lives in his home with his wife; 4 children.   Depression:  Yes.  Denies any unmanaged symptoms on current medication regime.  Depression screening completed this call.  SSD since 2008.   Current contact information verified:  [address] [place], [place] [address] [place] [phone] home [phone] mobile  Transportation:  Patient or wife.  DME:  BP cuff, glucometer, scales, wound dressing supplies, cane, walker, W/C, scooter, BSC, hospital bed.   Advance Directives:  None.  Patient states "I have explained to my wife and 4 kids what I would want but I have never completed any document."  States he would want to be a full code until a determination is made that revival is no longer possible.  RN CM provided education on the difference between a living will and advance directive.   Patient agreed to RN CM mailing patient a  copy for review.   RN CM will follow up again on future calls to determine patient's decision / wishes to complete formal document.   Diabetes: Diagnosed in 1991 Blood sugar 120-140.  Patient only checking blood sugar 1-2 times a week.  States he thinks MD would like for him to check his blood sugar 3-4 times a day but is not sure.  States reason for not checking is lack of testing supplies.  States he has not received any supplies since discontinuing his insurance with [employer] and starting coverage with [employer].  Patient uses [employer] mail order and is not able to confirm if Frederick Foster has been provided an MD order for his diabetic testing supplies.  Patient states last A1C 7.3 and MD's target goal is below 6 (per patient).  However, per EPIC last A1C - 8.6 on 09/11/14. Weight:  250 lbs Height: 6 feet Denies any issues with his feet  No Diabetic eye exam for 10+years.  MD follow-up every 3 months for Diabetes care with Dr. Laurance Foster. RN CM identifies need for Diabetes Education. RN CM identifies care coordination needs regarding Diabetic testing supplies:  MD order needed and new health provider [employer] Mail order service needs to be notified.  RN CM will send the following educational materials -Diabetes: Why Get Your A1C Checked? -Diabetes - Eye Care -Diabetes - Preventing Heart Attack and Stroke STG:  Patient will review educational materials and review materials with RN CM within one month.   CHF 09/2014 admission with a diagnosis of fluid overload related to heart failure and had 21 pounds of fluid removed with diuretic therapy.  Cardiologist -Dr. [place] Foster   Cardiologist: Heart Pacer check scheduled for 12/26/2014 Patient  states he encountered much swelling in the abdomen on this last occurrence but has not problems at this time.  Patient states he weighs but not everyday.  Patient only weighs when he feels he is gaining weight.  RN CM identifies patient in need of CHF education and disease management.   RN  CM will provide CHF following DM program goals being met.   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   Most recent admission to [facility] 10/22/14 - 11/06/14 due to perineal wound infection, wound debridement and drainage. Admission complicated by hypotension, renal failure with acute metabolic acidosis. Treated with CRRT [continuous renal replacement therapy] and transitioned to hemodialysis. Refused recommended SNF at discharge. Next wound check with Dr. Rosana Foster - next Monday 12/02/2014 - [place] location  - states Dr. Rosana Foster handles all his dialysis and cancer care needs.  Surgical removal only - no radiation or chemo required.  States h/o past Chemo with cancer management some years ago but none at present time.   Medications: Patient states almost all his medications were discontinued on his most recent Inpt. Admission to [facility] on 10/22/14-11/06/14.  Patient states he has not resumed any medications since home and is not sure what the plan is regarding his medications.  States next primary MD appt is not until first week of June 2016. Medication reconciliation completed this call. (See Medication list).   Plan / Care Coordination: Telephonic Case Management 3 - Acuity Level 3 Disease Management education - Diabetes RN CM will send primary MD, Dr. Laurance Foster barriers letter and initial assessment RN CM will send patient introductory letter and package, consent form, and educational materials on A1C   Care Coordination: Care Coordination needs:  Diabetic testing supplies Contact Primary MD regarding medications:  Most meds discontinued and have not been resumed since last admission 10/2014  Medications Reviewed Today    Reviewed by Frederick Brooking, RN (Registered Nurse) on 11/25/14 at 1428  Med List Status: <None>    Medication Order Taking? Sig Documenting Provider Last Dose Status Informant   ALPRAZolam (XANAX) 1 MG tablet [id number] Yes Take 1 tablet (1 mg total) by mouth at bedtime as needed for anxiety or sleep. Mary-Margaret Hassell Done, FNP Taking Active    amLODipine (NORVASC) 10 MG tablet [id number] No Take 10 mg by mouth. Historical Provider, MD Not Taking Active              Med Note Sunnie Nielsen Nov 25, 2014 12:17 PM): Patient states medication was discontinued on Inpt. [facility] admission 10/22/14 - 11/06/14.      aspirin 81 MG tablet [id number] Yes Take 81 mg by mouth daily.  Historical Provider, MD Taking Active Self             Med Note Hassell Done, [facility]   Tue Nov 20, 2013  8:45 AM): Received from: External Pharmacy    carvedilol (COREG) 3.125 MG tablet [id number] Yes Take 1 tablet (3.125 mg total) by mouth 2 (two) times daily with a meal. Mary-Margaret Hassell Done, FNP Taking Active    Cholecalciferol (VITAMIN D3) 5000 UNITS CAPS [id number] Yes Take 1 capsule by mouth daily. Historical Provider, MD Taking Active Self             Med Note Trinidad Curet, Celedonio Savage   Fri Aug 30, 2014  7:16 PM): Received from: External  Pharmacy Received Sig:     citalopram (CELEXA) 20 MG tablet [id number] Yes Take 1 tablet (20 mg total) by mouth every evening. Mary-Margaret Hassell Done, FNP Taking Active    clopidogrel (PLAVIX) 75 MG tablet [id number] No Take 75 mg by mouth. Historical Provider, MD Not Taking Active              Med Note Sunnie Nielsen Nov 25, 2014 12:17 PM): Patient states medication was discontinued on Inpt. [facility] admission 10/22/14 - 11/06/14.       furosemide (LASIX) 20 MG tablet [id number] No Take 1 tablet (20 mg total) by mouth every morning.  Patient not taking:  Reported on 11/25/2014   Mary-Margaret Hassell Done, [place] Not Taking Active              Med Note Sunnie Nielsen Nov 25, 2014 12:17 PM): Patient states medication was  discontinued on Inpt. [facility] admission 10/22/14 - 11/06/14.     glimepiride (AMARYL) 4 MG tablet [id number] No Take 1 tablet (4 mg total) by mouth daily with breakfast.  Patient not taking:  Reported on 11/25/2014   Mary-Margaret Hassell Done, [place] Not Taking Active              Med Note Sunnie Nielsen Nov 25, 2014 12:18 PM): Patient states medication was discontinued on Inpt. [facility] admission 10/22/14 - 11/06/14.     HYDROcodone-acetaminophen (NORCO/VICODIN) 5-325 MG per tablet [id number] Yes Take 1 tablet by mouth every 6 (six) hours as needed for moderate pain. Mary-Margaret Hassell Done, FNP Taking Active    Insulin Glargine (TOUJEO SOLOSTAR) 300 UNIT/ML SOPN [id number] Yes Inject 65 Units into the skin daily. BID Mary-Margaret Hassell Done, FNP Taking Active              Med Note Tonye Royalty, Annaleah Arata K   Mon Nov 25, 2014  2:28 PM): Currently taking 40 units everyday.     isosorbide mononitrate (IMDUR) 30 MG 24 hr tablet [id number] No Take 1 tablet (30 mg total) by mouth daily.  Patient not taking:  Reported on 11/25/2014   Mary-Margaret Hassell Done, [place] Not Taking Active              Med Note Sunnie Nielsen Nov 25, 2014 12:18 PM): Patient states medication was discontinued on Inpt. [facility] admission 10/22/14 - 11/06/14.     lisinopril (PRINIVIL,ZESTRIL) 20 MG tablet [id number] No Take 1 tablet (20 mg total) by mouth daily.  Patient not taking:  Reported on 11/25/2014   Mary-Margaret Hassell Done, [place] Not Taking Active              Med Note Sunnie Nielsen Nov 25, 2014 12:18 PM): Patient states medication was discontinued on Inpt. [facility] admission 10/22/14 - 11/06/14.      nitroGLYCERIN (NITROSTAT) 0.4 MG SL tablet [id number] No Place 0.4 mg under the tongue. Historical Provider, MD  Active              Med Note Zack Seal Sep 11, 2014  9:37 AM): Received from: [facility]    simvastatin (ZOCOR) 40 MG tablet [id number] No Take 1 tablet (40 mg total) by mouth at bedtime. Mary-Margaret Hassell Done, FNP  Active              Med Note (Sael Furches K  Mon Nov 25, 2014 12:18 PM): Patient states medication was discontinued on Inpt. [facility] admission 10/22/14 - 11/06/14.       sodium bicarbonate 650 MG tablet [id number] No Take 2 tablets (1,300 mg total) by mouth 3 (three) times daily.  Patient not taking:  Reported on 11/25/2014   Mary-Margaret Hassell Done, [place] Not Taking Active Self             Med Note Sunnie Nielsen Nov 25, 2014 12:19 PM): Patient states medication was discontinued on Inpt. [facility] admission 10/22/14 - 11/06/14.       Med List Note Vikki Ports Dorthy Cooler, CPHT 04/25/12 1340): Also uses Bryce Plan Problem One        Patient Outreach Telephone from 11/25/2014 in [place] Problem One  Knowledge deficit relating to Diabetes Management - elevated A1C)   Care Plan for Problem One  Active   THN Long Term Goal (31-90 days)  Patient will increase his knowledge of Diabetes and A1C management and provide teach back to RN CM within the next 90 days.    THN Long Term Goal Start Date  11/25/14   Interventions for Problem One Long Term Goal  RN CM will provide patient supportive education and educational materials to improve A1C knowledge within the next 90 days.    THN CM Short Term Goal #1 (0-30 days)  Patient will review educational materials over the next 30 days and discuss with RN CM within 30 days.    THN CM Short Term Goal #1 Start Date  11/25/14   Interventions for Short Term Goal #1  RN CM will send educational materials:  Diabetes: Why Get Your A1C Checked?, Diabetes - Eye Care, and Diabetes-Preventing Heart Attack and Stroke.     [facility] CM Care Plan Problem Two        Patient Outreach Telephone from 11/25/2014 in [place] Problem Two  Knowledge deficit relating to Advance Directives.    Care Plan for Problem Two  Active   THN CM Short Term Goal #1 (0-30 days)  Patient will review copy of Advance Directives, Living Will, [place] and review materials with RN CM within the next 30 days.    THN CM Short Term Goal #1 Start Date  11/25/14   Interventions for Short Term Goal #2   RN CM will mail copy of Advance Directives to patient within the next 30 days.     [facility] CM Care Plan Problem Three        Patient Outreach Telephone from 11/25/2014 in [place] Problem Three  Non-adherent to self checking blood sugar readings as MD has ordered relating to lack of tesing supplies.   Care Plan for Problem Three  Active   THN CM Short Term Goal #1 (0-30 days)  Patient will resolve issue with obtaining diabetic testing supplies over the next 30 days.    THN CM Short Term Goal #1 Start Date  11/25/14   Interventions for Short Term Goal #1  RN CM will asssist with care-coordination needs to resolve issues surrounding diabetic supply testing.       Mariann Laster, RN, BSN, [facility], CCM  [employer] Management Coordinator [phone] Office [phone] Direct [phone] Cell

## 2014-12-04 ENCOUNTER — Telehealth: Payer: Self-pay | Admitting: Nurse Practitioner

## 2014-12-04 NOTE — Telephone Encounter (Signed)
Appointment given for 6/3 with Ronnald Collum, FNP.

## 2014-12-06 ENCOUNTER — Encounter: Payer: Self-pay | Admitting: Gastroenterology

## 2014-12-10 ENCOUNTER — Ambulatory Visit (INDEPENDENT_AMBULATORY_CARE_PROVIDER_SITE_OTHER): Payer: Commercial Managed Care - HMO | Admitting: Family Medicine

## 2014-12-10 DIAGNOSIS — R262 Difficulty in walking, not elsewhere classified: Secondary | ICD-10-CM | POA: Diagnosis not present

## 2014-12-10 DIAGNOSIS — C229 Malignant neoplasm of liver, not specified as primary or secondary: Secondary | ICD-10-CM

## 2014-12-10 DIAGNOSIS — C679 Malignant neoplasm of bladder, unspecified: Secondary | ICD-10-CM | POA: Diagnosis not present

## 2014-12-10 DIAGNOSIS — I5022 Chronic systolic (congestive) heart failure: Secondary | ICD-10-CM | POA: Diagnosis not present

## 2014-12-13 ENCOUNTER — Encounter: Payer: Self-pay | Admitting: Nurse Practitioner

## 2014-12-13 ENCOUNTER — Ambulatory Visit (INDEPENDENT_AMBULATORY_CARE_PROVIDER_SITE_OTHER): Payer: Commercial Managed Care - HMO | Admitting: Nurse Practitioner

## 2014-12-13 VITALS — BP 117/61 | HR 81 | Temp 97.1°F | Ht 72.0 in | Wt 239.0 lb

## 2014-12-13 DIAGNOSIS — I10 Essential (primary) hypertension: Secondary | ICD-10-CM

## 2014-12-13 DIAGNOSIS — Z09 Encounter for follow-up examination after completed treatment for conditions other than malignant neoplasm: Secondary | ICD-10-CM

## 2014-12-13 DIAGNOSIS — F418 Other specified anxiety disorders: Secondary | ICD-10-CM | POA: Diagnosis not present

## 2014-12-13 MED ORDER — ALPRAZOLAM 1 MG PO TABS: 1.0000 mg | ORAL_TABLET | Freq: Two times a day (BID) | ORAL | Status: DC | PRN

## 2014-12-13 MED ORDER — GLUCOSE BLOOD VI STRP: ORAL_STRIP | Status: AC

## 2014-12-13 NOTE — Progress Notes (Signed)
   Subjective:    Patient ID: Frederick Foster, male    DOB: Feb 24, 1951, 64 y.o.   MRN: [id number]  HPI Patient just recently got  Out of hospital for uretheral infection, acute kidney failure and heart failure.- They removed the entire urethra and he still has a wound in perineal area. Upon discharge they stopped his Imdur, lisinopril, amlodipine , lasix am=nd amaryl. Patient says that he feels terrible. Poor energy. He is now on dialysis 3 days a week. On those days he does not take coreg prior to treatment because his blood pressure drops to low.  * Patient has had a cystomy for 2 years- scant amounts of urine output daily- they have decreased his fluid consumption.  Review of Systems  Constitutional: Negative.   HENT: Negative.   Respiratory: Negative.   Cardiovascular: Negative.   Genitourinary: Negative.   Neurological: Negative.   Psychiatric/Behavioral: Negative.   All other systems reviewed and are negative.      Objective:   Physical Exam  Constitutional: He appears well-developed and well-nourished.  Cardiovascular: Normal rate, regular rhythm and normal heart sounds.   Pulmonary/Chest: Effort normal and breath sounds normal.  Neurological: He is alert.  Skin: Skin is warm.  Right perineal incision with wound packing- no signs of infection.  Psychiatric: He has a normal mood and affect. His behavior is normal. Judgment and thought content normal.   BP 117/61 mmHg  Pulse 81  Temp(Src) 97.1 F (36.2 C) (Oral)  Ht 6' (1.829 m)  Wt 239 lb (108.41 kg)  BMI 32.41 kg/m2        Assessment & Plan:  1. Essential hypertension Continue carvedilol 3.125 1 po BID- if increase to 2 po BID blood pressure will get to low. Keep diary of blood pressure.  2. Hospital discharge follow-up hospital records reviewed  3. Depression with anxiety Take xanax no more then 2 po qd - ALPRAZolam (XANAX) 1 MG tablet; Take 1 tablet (1 mg total) by mouth 2 (two) times daily as needed for  anxiety or sleep.  Dispense: 60 tablet; Refill: [place], FNP

## 2014-12-20 ENCOUNTER — Ambulatory Visit (INDEPENDENT_AMBULATORY_CARE_PROVIDER_SITE_OTHER): Payer: Commercial Managed Care - HMO

## 2014-12-20 ENCOUNTER — Encounter: Payer: Self-pay | Admitting: Physician Assistant

## 2014-12-20 ENCOUNTER — Ambulatory Visit (INDEPENDENT_AMBULATORY_CARE_PROVIDER_SITE_OTHER): Payer: Commercial Managed Care - HMO | Admitting: Physician Assistant

## 2014-12-20 ENCOUNTER — Emergency Department (HOSPITAL_COMMUNITY): Payer: Commercial Managed Care - HMO

## 2014-12-20 ENCOUNTER — Emergency Department (HOSPITAL_COMMUNITY)
Admission: EM | Admit: 2014-12-20 | Discharge: 2014-12-21 | Disposition: A | Discharge status: Home / Self Care | Payer: Commercial Managed Care - HMO | Attending: Emergency Medicine | Admitting: Emergency Medicine

## 2014-12-20 ENCOUNTER — Encounter (HOSPITAL_COMMUNITY): Payer: Self-pay | Admitting: *Deleted

## 2014-12-20 VITALS — BP 145/83 | HR 91 | Temp 96.9°F | Ht 72.0 in | Wt 238.6 lb

## 2014-12-20 DIAGNOSIS — Z8572 Personal history of non-Hodgkin lymphomas: Secondary | ICD-10-CM | POA: Insufficient documentation

## 2014-12-20 DIAGNOSIS — R0600 Dyspnea, unspecified: Secondary | ICD-10-CM

## 2014-12-20 DIAGNOSIS — Z880 Allergy status to penicillin: Secondary | ICD-10-CM | POA: Insufficient documentation

## 2014-12-20 DIAGNOSIS — I2510 Atherosclerotic heart disease of native coronary artery without angina pectoris: Secondary | ICD-10-CM | POA: Diagnosis not present

## 2014-12-20 DIAGNOSIS — Z8719 Personal history of other diseases of the digestive system: Secondary | ICD-10-CM | POA: Diagnosis not present

## 2014-12-20 DIAGNOSIS — Z7982 Long term (current) use of aspirin: Secondary | ICD-10-CM | POA: Diagnosis not present

## 2014-12-20 DIAGNOSIS — I129 Hypertensive chronic kidney disease with stage 1 through stage 4 chronic kidney disease, or unspecified chronic kidney disease: Secondary | ICD-10-CM | POA: Diagnosis not present

## 2014-12-20 DIAGNOSIS — Z8546 Personal history of malignant neoplasm of prostate: Secondary | ICD-10-CM | POA: Insufficient documentation

## 2014-12-20 DIAGNOSIS — R0602 Shortness of breath: Secondary | ICD-10-CM

## 2014-12-20 DIAGNOSIS — Z87891 Personal history of nicotine dependence: Secondary | ICD-10-CM | POA: Diagnosis not present

## 2014-12-20 DIAGNOSIS — Z79899 Other long term (current) drug therapy: Secondary | ICD-10-CM | POA: Diagnosis not present

## 2014-12-20 DIAGNOSIS — Z8551 Personal history of malignant neoplasm of bladder: Secondary | ICD-10-CM | POA: Diagnosis not present

## 2014-12-20 DIAGNOSIS — N183 Chronic kidney disease, stage 3 unspecified: Secondary | ICD-10-CM

## 2014-12-20 DIAGNOSIS — F329 Major depressive disorder, single episode, unspecified: Secondary | ICD-10-CM | POA: Diagnosis not present

## 2014-12-20 DIAGNOSIS — F419 Anxiety disorder, unspecified: Secondary | ICD-10-CM | POA: Insufficient documentation

## 2014-12-20 DIAGNOSIS — E785 Hyperlipidemia, unspecified: Secondary | ICD-10-CM | POA: Diagnosis not present

## 2014-12-20 DIAGNOSIS — Z794 Long term (current) use of insulin: Secondary | ICD-10-CM | POA: Diagnosis not present

## 2014-12-20 DIAGNOSIS — J90 Pleural effusion, not elsewhere classified: Secondary | ICD-10-CM | POA: Diagnosis not present

## 2014-12-20 LAB — COMPREHENSIVE METABOLIC PANEL
ALK PHOS: 71 U/L (ref 38–126)
ALT: 11 U/L — AB (ref 17–63)
ANION GAP: 11 (ref 5–15)
AST: 14 U/L — ABNORMAL LOW (ref 15–41)
Albumin: 3.4 g/dL — ABNORMAL LOW (ref 3.5–5.0)
BILIRUBIN TOTAL: 1.5 mg/dL — AB (ref 0.3–1.2)
BUN: 21 mg/dL — ABNORMAL HIGH (ref 6–20)
CO2: 27 mmol/L (ref 22–32)
Calcium: 8.7 mg/dL — ABNORMAL LOW (ref 8.9–10.3)
Chloride: 100 mmol/L — ABNORMAL LOW (ref 101–111)
Creatinine, Ser: 1.81 mg/dL — ABNORMAL HIGH (ref 0.61–1.24)
GFR calc Af Amer: 44 mL/min — ABNORMAL LOW (ref >60)
GFR calc non Af Amer: 38 mL/min — ABNORMAL LOW (ref >60)
Glucose, Bld: 230 mg/dL — ABNORMAL HIGH (ref 65–99)
POTASSIUM: 3.4 mmol/L — AB (ref 3.5–5.1)
Sodium: 138 mmol/L (ref 135–145)
Total Protein: 6.8 g/dL (ref 6.5–8.1)

## 2014-12-20 LAB — BRAIN NATRIURETIC PEPTIDE: B NATRIURETIC PEPTIDE 5: 1763 pg/mL — AB (ref 0.0–100.0)

## 2014-12-20 LAB — CBC WITH DIFFERENTIAL/PLATELET
BASOS PCT: 1 % (ref 0–1)
Basophils Absolute: 0 10*3/uL (ref 0.0–0.1)
EOS ABS: 0.1 10*3/uL (ref 0.0–0.7)
Eosinophils Relative: 2 % (ref 0–5)
HCT: 32.5 % — ABNORMAL LOW (ref 39.0–52.0)
HEMOGLOBIN: 10.3 g/dL — AB (ref 13.0–17.0)
Lymphocytes Relative: 29 % (ref 12–46)
Lymphs Abs: 1.7 10*3/uL (ref 0.7–4.0)
MCH: 30.3 pg (ref 26.0–34.0)
MCHC: 31.7 g/dL (ref 30.0–36.0)
MCV: 95.6 fL (ref 78.0–100.0)
MONO ABS: 0.5 10*3/uL (ref 0.1–1.0)
MONOS PCT: 9 % (ref 3–12)
Neutro Abs: 3.6 10*3/uL (ref 1.7–7.7)
Neutrophils Relative %: 61 % (ref 43–77)
Platelets: 202 10*3/uL (ref 150–400)
RBC: 3.4 MIL/uL — ABNORMAL LOW (ref 4.22–5.81)
RDW: 15.8 % — AB (ref 11.5–15.5)
WBC: 5.9 10*3/uL (ref 4.0–10.5)

## 2014-12-20 LAB — LIPASE, BLOOD: Lipase: 29 U/L (ref 22–51)

## 2014-12-20 LAB — CBG MONITORING, ED: Glucose-Capillary: 186 mg/dL — ABNORMAL HIGH (ref 65–99)

## 2014-12-20 MED ORDER — TECHNETIUM TO 99M ALBUMIN AGGREGATED
6.0000 | Freq: Once | INTRAVENOUS | Status: AC | PRN
  Administered 2014-12-20: 6.3 via INTRAVENOUS

## 2014-12-20 MED ORDER — TECHNETIUM TC 99M DIETHYLENETRIAME-PENTAACETIC ACID
40.0000 | Freq: Once | INTRAVENOUS | Status: AC | PRN
  Administered 2014-12-20: 44 via INTRAVENOUS

## 2014-12-20 NOTE — ED Notes (Signed)
Pt states he is a diabetic and has not eaten since 0500 this am. Will check CBG.

## 2014-12-20 NOTE — ED Notes (Signed)
Pt. Has extensive hx of cancer.

## 2014-12-20 NOTE — Progress Notes (Signed)
Subjective:     Patient ID: Frederick Foster, male   DOB: 1950/11/05, 64 y.o.   MRN: [id number]  HPI Pt seen as urgent walk in with dyspnea He has a very complicated hx Pt with recent pacemaker [facility] While there he states they changed his meds multiple times and cut many meds out Pt then had a pneumonia following discharge and had to return the hosp Pt has now been out for ~ 1 month Has chronic renal dz and is undergoing dialysis 3x/wk Despite going on a regular basis he has had increasing SOB He states at dialysis he is doing well and states they are getting almost no extra fluid The dyspnea is worse with activities Denis any fever/chills  Review of Systems  Constitutional: Positive for activity change, appetite change and fatigue.  HENT: Negative.   Respiratory: Positive for shortness of breath.   Cardiovascular: Negative.   Neurological: Positive for tremors.       Objective:   Physical Exam  Constitutional: He appears well-developed and well-nourished.  HENT:  Mouth/Throat: Oropharynx is clear and moist.  Neck: Neck supple. No tracheal deviation present.  Cardiovascular: Normal rate, regular rhythm and intact distal pulses.   Pulmonary/Chest: Effort normal. He has no wheezes. He has no rales.  Decrease in lung sounds to the L base  Musculoskeletal: He exhibits no edema.  Lymphadenopathy:    He has no cervical adenopathy.  Nursing note and vitals reviewed. CXR- L pleural effusion Pulse ox on RA at 94 With walking down the hall O2 desat to 87 and pulse increase to 133     Assessment:     Progressive dyspnea L pleural effusion    Plan:     Given renal dz not wanting to place pt right back on Lasix He has has multiple other medical issues that complicate his case Recommended f/u through hosp He would like to be seen at  [facility] His Cardiologist have been through [place] so they could f/u with him in hosp Wife is going to take him  directly F/U prn

## 2014-12-20 NOTE — ED Notes (Signed)
CT called, pt. Is next to be taken to CT. ETA 15-20 min.

## 2014-12-20 NOTE — Patient Instructions (Signed)

## 2014-12-20 NOTE — ED Notes (Signed)
X-ray notified that we need report from radiologist of x-ray taken this morning at PCP's office.

## 2014-12-20 NOTE — ED Notes (Signed)
MD at bedside. 

## 2014-12-20 NOTE — ED Provider Notes (Signed)
CSN: [id number]     Arrival date & time 12/20/14  1221 History   First MD Initiated Contact with Patient 12/20/14 1317     Chief Complaint  Patient presents with  . Shortness of Breath     (Consider location/radiation/quality/duration/timing/severity/associated sxs/prior Treatment) The history is provided by the patient.   64 year old male referred in for abnormal chest x-ray done [facility]. Basically showed pleural effusion. Patient with shortness of breath for 4 weeks worse in the past several days. No fever no chest pain patient is a dialysis patient normally dialyzed Tuesdays Thursdays and Saturdays was dialyzed on Thursday. Patient does not use home oxygen. Patient denies fevers.  Past Medical History  Diagnosis Date  . Mixed hyperlipidemia   . 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 HERNIA, VENTRAL   . CAROTID BRUIT   . Coronary artery disease     h/o MI and bypass surgery x 3 in 4081 complicated by sternal wound infection, sees Dr. Johnsie Cancel  . Vein symptom     injury to left leg due to conveyor belt.  caused injury to veins in left leg/ s/p surgery   . Sinus congestion     has allergies  . AODM   . Hiatal hernia     s/p hiatal hernia surgery 2010  . Anxiety     history of panic attacks  . Depression     takes celexa  . Atrial flutter 11/19/2011  . Atrial fibrillation   . Open abdominal wall wound   . Cancer      kidney ca, also mass to left neck  . Lymphoma   . Bladder cancer   . Prostate cancer   . Non Hodgkin's lymphoma   . CKD (chronic kidney disease), stage III     has one kidney.  had left kidney removed in 2011 for cancer  . Chronic kidney disease, stage III (moderate)   . Acute renal failure 04/06/2013    With hyperkalemia and met acidosis   Past Surgical History  Procedure Laterality Date  . Cholecystectomy    . Kidney surgery    . Hernia repair      2010  .  Coronary artery bypass graft      x3 .  Dr. Florence Canner is present cardiologist  . Vein surgery      s/p injury to left leg from conveyor belt  . Cardiac catheterization      2009  . Portacath placement    . Tee without cardioversion  01/07/2012    Procedure: TRANSESOPHAGEAL ECHOCARDIOGRAM (TEE);  Surgeon: Fay Records, MD;  Location: [facility] ENDOSCOPY;  Service: Cardiovascular;  Laterality: N/A;  . Atrial flutter ablation  01/07/12    CTI ablation by Dr Rayann Heman  . Bladder surgery    . Prostate surgery    . Temporary pacemaker insertion    . Atrial flutter ablation N/A 01/07/2012    Procedure: ATRIAL FLUTTER ABLATION;  Surgeon: Thompson Grayer, MD;  Location: [facility] CATH LAB;  Service: Cardiovascular;  Laterality: N/A;  . Bladder surgery     No family history on file. History  Substance Use Topics  . Smoking status: Former Smoker -- 1.00 packs/day for 40 years    Types: Cigarettes    Quit date: 11/22/2009  . Smokeless tobacco: Never Used  . Alcohol Use: No     Comment: has not drank since 1991  Review of Systems  Constitutional: Negative for fever.  HENT: Negative for congestion.   Eyes: Negative for redness.  Respiratory: Positive for shortness of breath.   Cardiovascular: Negative for chest pain and leg swelling.  Gastrointestinal: Negative for nausea, vomiting and abdominal pain.  Genitourinary: Negative for scrotal swelling.  Musculoskeletal: Negative for back pain.  Skin: Negative for rash.  Neurological: Negative for headaches.  Psychiatric/Behavioral: Negative for confusion.      Allergies  Penicillins; Other; and Sulfonamide derivatives  Home Medications   Prior to Admission medications   Medication Sig Start Date End Date Taking? Authorizing Provider  ALPRAZolam Duanne Moron) 1 MG tablet Take 1 tablet (1 mg total) by mouth 2 (two) times daily as needed for anxiety or sleep. 12/13/14  Yes Mary-Margaret Hassell Done, FNP  aspirin 81 MG tablet Take 81 mg by mouth daily.  11/11/13  Yes  Historical Provider, MD  atorvastatin (LIPITOR) 40 MG tablet Take 40 mg by mouth daily. 09/20/14  Yes Historical Provider, MD  carvedilol (COREG) 3.125 MG tablet Take 1 tablet (3.125 mg total) by mouth 2 (two) times daily with a meal. 11/21/14  Yes Mary-Margaret Hassell Done, FNP  citalopram (CELEXA) 20 MG tablet Take 1 tablet (20 mg total) by mouth every evening. 09/11/14  Yes Mary-Margaret Hassell Done, FNP  glucose blood (ONETOUCH VERIO) test strip Check blood sugar 1x per day E11.9 12/13/14  Yes Mary-Margaret Hassell Done, FNP  HYDROcodone-acetaminophen (NORCO/VICODIN) 5-325 MG per tablet Take 1 tablet by mouth every 6 (six) hours as needed for moderate pain. 11/21/14  Yes Mary-Margaret Hassell Done, FNP  Insulin Glargine (TOUJEO SOLOSTAR) 300 UNIT/ML SOPN Inject 65 Units into the skin daily. BID Patient taking differently: Inject 40 Units into the skin daily.  09/11/14  Yes Mary-Margaret Hassell Done, FNP  nitroGLYCERIN (NITROSTAT) 0.4 MG SL tablet Place 0.4 mg under the tongue. 09/06/14  Yes Historical Provider, MD  oxyCODONE (OXY IR/ROXICODONE) 5 MG immediate release tablet Take 2.5-5 mg by mouth. 11/05/14  Yes Historical Provider, MD   BP 129/71 mmHg  Pulse 50  Temp(Src) 98.2 F (36.8 C) (Oral)  Resp 19  Ht 6' (1.829 m)  Wt 238 lb (107.956 kg)  BMI 32.27 kg/m2  SpO2 93% Physical Exam  Constitutional: He is oriented to person, place, and time. He appears well-developed and well-nourished. No distress.  HENT:  Head: Normocephalic and atraumatic.  Mouth/Throat: Oropharynx is clear and moist.  Eyes: Conjunctivae and EOM are normal. Pupils are equal, round, and reactive to light.  Neck: Normal range of motion. Neck supple.  Cardiovascular: Normal rate, regular rhythm and normal heart sounds.   No murmur heard. Pulmonary/Chest: Effort normal and breath sounds normal. No respiratory distress. He has no wheezes.  Abdominal: Soft. Bowel sounds are normal. There is no tenderness.  Uro ileostomy conduit  Musculoskeletal: Normal  range of motion. He exhibits no edema.  Neurological: He is alert and oriented to person, place, and time. No cranial nerve deficit. Coordination normal.  Skin: Skin is warm. No rash noted.  Nursing note and vitals reviewed.   ED Course  Procedures (including critical care time) Labs Review Labs Reviewed  COMPREHENSIVE METABOLIC PANEL - Abnormal; Notable for the following:    Potassium 3.4 (*)    Chloride 100 (*)    Glucose, Bld 230 (*)    BUN 21 (*)    Creatinine, Ser 1.81 (*)    Calcium 8.7 (*)    Albumin 3.4 (*)    AST 14 (*)    ALT 11 (*)  Total Bilirubin 1.5 (*)    GFR calc non Af Amer 38 (*)    GFR calc Af Amer 44 (*)    All other components within normal limits  CBC WITH DIFFERENTIAL/PLATELET - Abnormal; Notable for the following:    RBC 3.40 (*)    Hemoglobin 10.3 (*)    HCT 32.5 (*)    RDW 15.8 (*)    All other components within normal limits  BRAIN NATRIURETIC PEPTIDE - Abnormal; Notable for the following:    B Natriuretic Peptide 1763.0 (*)    All other components within normal limits  CBG MONITORING, ED - Abnormal; Notable for the following:    Glucose-Capillary 186 (*)    All other components within normal limits  LIPASE, BLOOD   Results for orders placed or performed during the hospital encounter of 12/20/14  Comprehensive metabolic panel  Result Value Ref Range   Sodium 138 135 - 145 mmol/L   Potassium 3.4 (L) 3.5 - 5.1 mmol/L   Chloride 100 (L) 101 - 111 mmol/L   CO2 27 22 - 32 mmol/L   Glucose, Bld 230 (H) 65 - 99 mg/dL   BUN 21 (H) 6 - 20 mg/dL   Creatinine, Ser 1.81 (H) 0.61 - 1.24 mg/dL   Calcium 8.7 (L) 8.9 - 10.3 mg/dL   Total Protein 6.8 6.5 - 8.1 g/dL   Albumin 3.4 (L) 3.5 - 5.0 g/dL   AST 14 (L) 15 - 41 U/L   ALT 11 (L) 17 - 63 U/L   Alkaline Phosphatase 71 38 - 126 U/L   Total Bilirubin 1.5 (H) 0.3 - 1.2 mg/dL   GFR calc non Af Amer 38 (L) >60 mL/min   GFR calc Af Amer 44 (L) >60 mL/min   Anion gap 11 5 - 15  Lipase, blood  Result  Value Ref Range   Lipase 29 22 - 51 U/L  CBC with Differential/Platelet  Result Value Ref Range   WBC 5.9 4.0 - 10.5 K/uL   RBC 3.40 (L) 4.22 - 5.81 MIL/uL   Hemoglobin 10.3 (L) 13.0 - 17.0 g/dL   HCT 32.5 (L) 39.0 - 52.0 %   MCV 95.6 78.0 - 100.0 fL   MCH 30.3 26.0 - 34.0 pg   MCHC 31.7 30.0 - 36.0 g/dL   RDW 15.8 (H) 11.5 - 15.5 %   Platelets 202 150 - 400 K/uL   Neutrophils Relative % 61 43 - 77 %   Neutro Abs 3.6 1.7 - 7.7 K/uL   Lymphocytes Relative 29 12 - 46 %   Lymphs Abs 1.7 0.7 - 4.0 K/uL   Monocytes Relative 9 3 - 12 %   Monocytes Absolute 0.5 0.1 - 1.0 K/uL   Eosinophils Relative 2 0 - 5 %   Eosinophils Absolute 0.1 0.0 - 0.7 K/uL   Basophils Relative 1 0 - 1 %   Basophils Absolute 0.0 0.0 - 0.1 K/uL  Brain natriuretic peptide  Result Value Ref Range   B Natriuretic Peptide 1763.0 (H) 0.0 - 100.0 pg/mL  CBG monitoring, ED  Result Value Ref Range   Glucose-Capillary 186 (H) 65 - 99 mg/dL   Dg Chest 2 View  12/20/2014   CLINICAL DATA:  Increasing shortness of breath since recent April hospitalization.  EXAM: CHEST  2 VIEW  COMPARISON:  11/15/2013  FINDINGS: There is hyperinflation of the lungs compatible with COPD. Small to moderate left pleural effusion with left lower lobe atelectasis or infiltrate. No focal opacity on the  right. Heart is upper limits normal in size. Prior median sternotomy. Right dialysis catheter and Port-A-Cath remain in place, unchanged. Left dialysis catheter tips in the right atrium, no pneumothorax.  IMPRESSION: Small to moderate left pleural effusion with left lower lobe atelectasis or infiltrate.  Underline COPD.   Electronically Signed   By: Rolm Baptise M.D.   On: 12/20/2014 15:49   Ct Chest Wo Contrast  12/20/2014   CLINICAL DATA:  Shortness of breath with decreased left base lung sounds. Multiple primary malignancies, including kidney and bladder.  EXAM: CT CHEST WITHOUT CONTRAST  TECHNIQUE: Multidetector CT imaging of the chest was  performed following the standard protocol without IV contrast.  COMPARISON:  Plain films earlier today. PET of 08/08/2012. Abdominal CT of 08/30/2014  FINDINGS: Mediastinum/Nodes: A left-sided dialysis catheter which terminates at the mid SVC. Dual lead pacer. Mild cardiomegaly with median sternotomy for prior coronary artery bypass graft.  Advanced aortic and branch vessel atherosclerosis. No pericardial effusion. Pulmonary artery enlargement, with the outflow tract measuring 3.5 cm. Mobile small middle mediastinal nodes. Precarinal node measures 1.0 cm, enlarged from approximately 5 mm on the prior.  11 mm node in the azygoesophageal recess is similar and upper normal size. Hilar regions poorly evaluated without intravenous contrast.  Lungs/Pleura: New small left greater than right pleural effusions. Mild motion degradation throughout. Moderate centrilobular emphysema. Patchy left lower lobe atelectasis.  Upper abdomen: Normal imaged portions of the liver, spleen, stomach, pancreas. Probable left nephrectomy. Normal left adrenal gland and right kidney in the image portions. Cholecystectomy. Right adrenal enlargement and mild nodularity are again identified, similar. The most nodular component measures 1.6 cm on image 62 and is unchanged.  Musculoskeletal: Thoracic spondylosis.  IMPRESSION: 1. Motion degraded exam. 2. Small left greater than right pleural effusions with left base atelectasis. Question fluid overload. 3. Developing mild thoracic adenopathy. Possibly reactive and related to fluid overload. Given clinical history of multiple primary malignancies, consider CT followup at approximately 3 months. 4. Right adrenal thickening and nodularity, grossly similar to 08/30/2014 abdominal study. This could also be re-evaluated at followup or more entirely characterized with nonemergent outpatient abdominal MRI versus PET. 5. Pulmonary artery enlargement suggests pulmonary arterial hypertension.   Electronically  Signed   By: Abigail Miyamoto M.D.   On: 12/20/2014 19:30      Imaging Review Dg Chest 2 View  12/20/2014   CLINICAL DATA:  Increasing shortness of breath since recent April hospitalization.  EXAM: CHEST  2 VIEW  COMPARISON:  11/15/2013  FINDINGS: There is hyperinflation of the lungs compatible with COPD. Small to moderate left pleural effusion with left lower lobe atelectasis or infiltrate. No focal opacity on the right. Heart is upper limits normal in size. Prior median sternotomy. Right dialysis catheter and Port-A-Cath remain in place, unchanged. Left dialysis catheter tips in the right atrium, no pneumothorax.  IMPRESSION: Small to moderate left pleural effusion with left lower lobe atelectasis or infiltrate.  Underline COPD.   Electronically Signed   By: Rolm Baptise M.D.   On: 12/20/2014 15:49   Ct Chest Wo Contrast  12/20/2014   CLINICAL DATA:  Shortness of breath with decreased left base lung sounds. Multiple primary malignancies, including kidney and bladder.  EXAM: CT CHEST WITHOUT CONTRAST  TECHNIQUE: Multidetector CT imaging of the chest was performed following the standard protocol without IV contrast.  COMPARISON:  Plain films earlier today. PET of 08/08/2012. Abdominal CT of 08/30/2014  FINDINGS: Mediastinum/Nodes: A left-sided dialysis catheter which terminates at  the mid SVC. Dual lead pacer. Mild cardiomegaly with median sternotomy for prior coronary artery bypass graft.  Advanced aortic and branch vessel atherosclerosis. No pericardial effusion. Pulmonary artery enlargement, with the outflow tract measuring 3.5 cm. Mobile small middle mediastinal nodes. Precarinal node measures 1.0 cm, enlarged from approximately 5 mm on the prior.  11 mm node in the azygoesophageal recess is similar and upper normal size. Hilar regions poorly evaluated without intravenous contrast.  Lungs/Pleura: New small left greater than right pleural effusions. Mild motion degradation throughout. Moderate centrilobular  emphysema. Patchy left lower lobe atelectasis.  Upper abdomen: Normal imaged portions of the liver, spleen, stomach, pancreas. Probable left nephrectomy. Normal left adrenal gland and right kidney in the image portions. Cholecystectomy. Right adrenal enlargement and mild nodularity are again identified, similar. The most nodular component measures 1.6 cm on image 62 and is unchanged.  Musculoskeletal: Thoracic spondylosis.  IMPRESSION: 1. Motion degraded exam. 2. Small left greater than right pleural effusions with left base atelectasis. Question fluid overload. 3. Developing mild thoracic adenopathy. Possibly reactive and related to fluid overload. Given clinical history of multiple primary malignancies, consider CT followup at approximately 3 months. 4. Right adrenal thickening and nodularity, grossly similar to 08/30/2014 abdominal study. This could also be re-evaluated at followup or more entirely characterized with nonemergent outpatient abdominal MRI versus PET. 5. Pulmonary artery enlargement suggests pulmonary arterial hypertension.   Electronically Signed   By: Abigail Miyamoto M.D.   On: 12/20/2014 19:30     EKG Interpretation   Date/Time:  Friday December 20 2014 14:54:14 EDT Ventricular Rate:  79 PR Interval:  192 QRS Duration: 204 QT Interval:  480 QTC Calculation: 550 R Axis:     Text Interpretation:  Sinus rhythm Atrial premature complexes Left bundle  branch block Baseline wander in lead(s) III V2 ATRIAL PACED RHYTHM  Confirmed by Rogene Houston  MD, Rodriquez Thorner [phone]) on 12/20/2014 3:00:55 PM      MDM   Final diagnoses:  SOB (shortness of breath)  Pleural effusion, bilateral    Patient with approximate 4 week history of shortness of breath essentially since he was discharged from the hospital last. Shortness of breath is increased over the past few days. However patient's oxygen saturations here even with brisk walking around the department of never dropped below 90%. Patient is also a  dialysis patient normally dialyzed Tuesdays Thursdays and Saturdays. Was dialyzed on Thursday. Patient does not use oxygen at home and has not required any here. Shortness of breath is worse with exertion. Patient is at high risk for possible pulmonary embolus and a lot of the workup has been in that direction. However patient is not a candidate for CT angios because he's only been on dialysis for a few months. And could not handle the dye load. So a VQ scan was ordered. The results of the VQ scan will be instrumental in determining whether patient can be discharged home or will require admission. If findings are equivocal patient may require admission. Patient is not on any anticoagulation at this time.   Patient has had significant risk factors for PE was recently in the hospital with a sepsis has had multiple cancers. Patient does not have any leg swelling.    Fredia Sorrow, MD 12/20/14 2139

## 2014-12-20 NOTE — ED Notes (Signed)
For evaluation of fluid on lungs per PCP

## 2014-12-20 NOTE — ED Notes (Signed)
Called x-ray again for report of x-ray. X-ray tech checking with radiologist and will call back .

## 2014-12-20 NOTE — ED Notes (Signed)
Pt. Resting in bed. Pt. Given meal. No distress noted.

## 2014-12-20 NOTE — ED Notes (Addendum)
Pt. Appears short of breath upon returning from VQ scan. Pt. sats 86% on room air. Pt. Placed on 2L [place]. Pt. Sitting up on side of bed. O2 sats 94%.

## 2014-12-20 NOTE — Discharge Instructions (Signed)
Follow up with your family md next week. °

## 2014-12-21 MED ORDER — FUROSEMIDE 10 MG/ML IJ SOLN
40.0000 mg | Freq: Once | INTRAMUSCULAR | Status: AC
  Administered 2014-12-21: 40 mg via INTRAVENOUS
  Filled 2014-12-21: qty 4

## 2014-12-21 NOTE — ED Notes (Signed)
Dr Zammit at bedside. 

## 2014-12-23 ENCOUNTER — Other Ambulatory Visit: Payer: Self-pay

## 2014-12-23 NOTE — Patient Outreach (Signed)
[facility]) Care Management  12/23/2014  AXEL FRISK Apr 25, 1951 [id number]  Referral Date: 11/20/14 Referral Source: [facility] [id number] Issue: Renal failure, cancer, (bladder, prostate, renal cell carcinoma, non-hodgkin's lymphoma; CAD post CABG, HTN, CHF, DM, h/o MI.  H/o IP admissions: 5 Most recent admission to [facility] 10/22/14 - 11/06/14 due to perineal wound infection, wound debridement and drainage. Admission complicated by hypotension, renal failure with acute metabolic acidosis. Treated with CRRT [continuous renal replacement therapy] and transitioned to hemodialysis. Refused recommended SNF at discharge. ED visits: 2   12/20/14 Triage Screening and Initial Assessment Date: 11/25/2014  Interdisciplinary Team: Dr. Redge Gainer or Chevis Pretty, [place] [phone] -  Last appt 12/13/14 Cardiologist: Heart Pacer check scheduled for 12/26/2014 [facility]:  Urologist / Surgeon: Dr. Carin Hock, MD. [phone]- last appt 12/23/14 Urology - [place], [place], [place], [place] [address]  Hemodialysis Tuesday - Thursday - Saturday at [facility] in [place], [place] last used 11/2014.   Social:  Married and lives in his home with his wife; 4 children.  SSD since 2008.  Current contact information verified:  [address] [place], [place] [address] [place] [phone] home [phone] mobile  Depression: Yes. Denies any unmanaged symptoms on current medication regime. Transportation: Patient or wife.  DME: BP cuff, glucometer, scales, wound dressing supplies, cane, walker, W/C, scooter, BSC, hospital bed, urostomy bag & supplies.   Advance Directives: None. Patient reported on 11/25/14 RN CM contact call;  "I have explained to my wife and 4 kids what I would want but I have never completed any document." States he would want to  be a full code until a determination is made that revival is no longer possible.  RN CM reviewed education on the difference between a living will and advance directive.  RN CM mailed patient a copy of AD for review on 11/25/14.  RN CM will follow up again on future calls to determine patient's decision / wishes to complete formal document.  Outcome:  12/23/2014 Patient has not reviewed and states it is in his folder but has not reviewed.  RN CM requested patient get his introductory folder out again and review the document sent.  RN CM instructed available for questions and will discuss again on next contact call for questions.   Diabetes: Diagnosed in 1991 Blood sugar 120-140. Morning Blood sugar 125.  States never goes above 200-240 even after eating.  States Glimepiride has been stopped and Toujeo was decreased to 40 Units at bedtime.  Patient states his blood sugar is always ok and reports only checking once a day; h/o patient was checking twice a day.  MD order for BS checks is 1 time per day per Epic Medication Order.  Patient previously reported on 11/25/14  reason for not checking is lack of testing supplies and had not received any supplies since discontinuing his insurance with [facility] and starting coverage with [employer]. Patient uses [employer] mail order and is not able to confirm if Mcarthur Rossetti has been provided an MD order for his diabetic testing supplies. Patient states last A1C 7.3 and MD's target goal is below 6 (per patient). However, per EPIC last A1C - 8.6 on 09/11/14. Weight: 238 and down from 250 lbs on 11/25/14.  Height: 6 feet Denies any issues with his feet  No Diabetic eye exam for 10+years.  MD follow-up every 3 months for Diabetes  care with Dr. Laurance Flatten or Shelah Lewandowsky. RN CM identifies need for Diabetes Education. RN CM identifies care coordination needs regarding Diabetic testing supplies:  RN CM provided patient with education that MD order may need to be updated and new  health provider [employer] Mail order service needs to be notified.  RN CM reviewed educational materials mailed on 11/25/14.  -Diabetes: Why Get Your A1C Checked? -Diabetes - Eye Care -Diabetes - Preventing Heart Attack and Stroke Patient confirms he received and reviewed and has no questions.  RN CM will notify Primary MD of need for diabetic eye exam.  RN CM will verify Diabetes Blood Sugar check order.   CHF (High risk for admission / h/o recent ER visit)  H/o admission 09/2014 - fluid overload related to heart failure; 21 pounds of fluid removed with diuretic therapy.  Cardiologist: Heart Pacer check scheduled for 12/26/2014 Recent ER visit at [facility] 12/20/14 - C/O shortness of breath and swelling in his abdominal girth and chest.  Confirms ER ruled out any blood clots and confirmed "I had fluid on my heart."  States ER did not add or change any medications.  States they did not want to start any meds since the [facility] hospital discontinued a lot of his meds and they have not been resumed.  States Primary Care visit with Chevis Pretty, NP completed post hospital discharge but MD would not restart meds that [facility].   Patient requested Lasix and ER advised patient if given that he would agree to schedule and keep his Primary MD appt for follow up on CHF management and medication review.   Patient states Lasix give with good output via urostomy bag for about then next 12-24 hours.   Patient confirms seeing Urologist/Surgeon, Dr. Tresa Endo this mooring 12/23/2014 who instructed in plans to make a Cardiology referral to MD at [facility]; instructed that the Cardiology office would contact patient with follow-up appt information.  Patient states past H/o Cardiology services with MD at the [facility] group but unable to recall MDs name.  Weight:  Patient reports last weight taken was about 238.  Patient states he weighs but not everyday. Patient only weighs when he feels he  is gaining weight.  States he is not able to identify his weight gain with daily home monitoring due to his dialysis can cause him to gain or loose 3 lbs everyday and he would be calling his MD all the time.   RN CM will continue to follow progress on Cardiology referral.   HTN Primary MD has requested patient log and track his BP readings.  RN CM sent BP and Weight logging sheet via mail to patient to encourage logging.   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 Surgical removal only - no radiation or chemo required. States h/o past Chemo with cancer management some years ago but none at present time.   Most recent admission to [facility] 10/22/14 - 11/06/14 due to perineal wound infection, wound debridement and drainage. Admission complicated by hypotension, renal failure with acute metabolic acidosis. Treated with CRRT [continuous renal replacement therapy] and transitioned to hemodialysis. Refused recommended SNF at discharge. Last appt with Urology/Surgeon, Dr. Tresa Endo was 12/23/2014 [place] location - states Dr. Rosana Hoes handles all his dialysis and cancer care needs.  States MD advised patient on 12/23/14 visit that patient is 2-3 weeks ahead  of schedule with healing and progress.  Next appt scheduled for 01/20/2015.   Medications: Patient states almost all his medications were discontinued on his most recent Inpt. Admission to [facility] on 10/22/14-11/06/14. Patient states he has not resumed any medications since home and is not sure what the plan is regarding his medications.   Per Epic Review:  Medication updates per Primary MD appt on 12/13/14 HTN:  Continue carvedilol 3.125 1 po BID - if increase to 2 po BID blood pressure will get to low (Keep diary of blood pressure) Depression with anxiety:  Take xanax no more  then 2 po qd - ALPRAZolam (XANAX) 1 MG tablet; Take 1 tablet (1 mg total) by mouth 2 (two) times daily as needed for anxiety or sleep. Dispense: 60 tablet; Refill: 1 Medication reconciliation completed this call. (See Medication list). RN CM advised to take medications as ordered.   Consent Patient confirms receipt of introductory package but states he has not completed the consent due to so busy with dialysis and appt's.   RN CM requested patient get his introductory folder out again and review the document sent.  RN CM instructed available for questions and will discuss again on next contact call for questions.   Plan / Care Coordination: Telephonic Case Management 3 - Acuity Level 3 RN CM advised to please notify MD of any changes in her condition prior to scheduled appt's.   RN CM provided contact name and #, 24-hour nurse line # [phone].   RN CM confirmed patient is aware of 911 services for urgent emergency needs.  Mariann Laster, RN, BSN, [facility], CCM  [employer] Management Coordinator [phone] Office [phone] Direct [phone] Cell

## 2014-12-27 NOTE — ED Notes (Signed)
Dr. Roderic Palau made aware of pts. O2 sats and that pt. Is requesting to speak to EDP regarding questions about his discharge.

## 2014-12-27 NOTE — ED Notes (Signed)
Dr. Roderic Palau states that pt is to be given lasix and then be discharged.

## 2015-01-01 ENCOUNTER — Encounter: Payer: Self-pay | Admitting: Nurse Practitioner

## 2015-01-01 ENCOUNTER — Ambulatory Visit (INDEPENDENT_AMBULATORY_CARE_PROVIDER_SITE_OTHER): Payer: Commercial Managed Care - HMO | Admitting: Nurse Practitioner

## 2015-01-01 VITALS — BP 100/62 | HR 67 | Temp 97.8°F | Ht 72.0 in | Wt 228.0 lb

## 2015-01-01 DIAGNOSIS — I5033 Acute on chronic diastolic (congestive) heart failure: Secondary | ICD-10-CM

## 2015-01-01 DIAGNOSIS — E1159 Type 2 diabetes mellitus with other circulatory complications: Secondary | ICD-10-CM

## 2015-01-01 DIAGNOSIS — F418 Other specified anxiety disorders: Secondary | ICD-10-CM | POA: Diagnosis not present

## 2015-01-01 DIAGNOSIS — E875 Hyperkalemia: Secondary | ICD-10-CM

## 2015-01-01 DIAGNOSIS — I10 Essential (primary) hypertension: Secondary | ICD-10-CM

## 2015-01-01 DIAGNOSIS — I495 Sick sinus syndrome: Secondary | ICD-10-CM | POA: Diagnosis not present

## 2015-01-01 DIAGNOSIS — I5032 Chronic diastolic (congestive) heart failure: Secondary | ICD-10-CM | POA: Diagnosis not present

## 2015-01-01 DIAGNOSIS — N183 Chronic kidney disease, stage 3 unspecified: Secondary | ICD-10-CM

## 2015-01-01 DIAGNOSIS — E785 Hyperlipidemia, unspecified: Secondary | ICD-10-CM | POA: Diagnosis not present

## 2015-01-01 DIAGNOSIS — R0989 Other specified symptoms and signs involving the circulatory and respiratory systems: Secondary | ICD-10-CM

## 2015-01-01 DIAGNOSIS — Z950 Presence of cardiac pacemaker: Secondary | ICD-10-CM

## 2015-01-01 DIAGNOSIS — E871 Hypo-osmolality and hyponatremia: Secondary | ICD-10-CM

## 2015-01-01 DIAGNOSIS — Z951 Presence of aortocoronary bypass graft: Secondary | ICD-10-CM | POA: Diagnosis not present

## 2015-01-01 LAB — POCT GLYCOSYLATED HEMOGLOBIN (HGB A1C): HEMOGLOBIN A1C: 7.6

## 2015-01-01 MED ORDER — CITALOPRAM HYDROBROMIDE 20 MG PO TABS: 20.0000 mg | ORAL_TABLET | Freq: Every evening | ORAL | Status: DC

## 2015-01-01 MED ORDER — ALPRAZOLAM 1 MG PO TABS: 1.0000 mg | ORAL_TABLET | Freq: Two times a day (BID) | ORAL | Status: DC | PRN

## 2015-01-01 NOTE — Patient Instructions (Signed)
Sodium and Fluid Restriction Some health conditions may require you to restrict your sodium and fluid intake. Sodium is part of the salt in the blood. Sodium may be restricted because when you take in a lot of salt, you become thirsty. Limiting salt with help you become less thirsty and may make it easier to restrict fluid. Talk to your caregiver or dietician about how many cups of fluid and how many milligrams of sodium you are allowed each day. If your caregiver has restricted your sodium and fluids, usually the amount you can drink depends on several things, such as:  Your urine output.  How much fluid you are retaining.  Your blood pressure. Every 2 cups (500 mL) of fluid retained in the body becomes an extra 1 pound (0.5 kg) of body weight. The following are examples of some fluids you will have to restrict:  Tea, coffee, soda, lemonade, milk, water, and juice.  Alcoholic beverages.  Cream.  Gravy.  Ice cubes.  Soup and broth. The following are foods that become liquid at room temperature. These foods will count towards your fluid intake.  Ice cream and ice milk.  Frozen yogurt and sherbet.  Frozen ice pops.  Flavored gelatin. YOU MAY BE TAKING IN TOO MUCH FLUID IF:  Your weight increases.  Your face, hands, legs, feet, and abdomen start to swell.  You have trouble breathing. HOME CARE INSTRUCTIONS If you follow a low sodium diet closely, you will eat approximately 1,500 mg of sodium a day.   Avoid salty foods. This increases your thirst and makes fluid control more difficult. Foods high in sodium include:  Most canned foods, including most meats.  Most processed foods, including most meats.  Cheese.  Dried pasta and rice mixes.  Snack foods (chips, popcorn, pretzels, cheese puffs, salted nuts).  Dips, sauces, and salad dressings.  Do not use salt in cooking or add salt to your meal. Cook with herbs and spices, but not those that have salt in the name. Ask  your caregiver if it is okay to use salt substitutes.  Eat home-prepared meals. Use fresh ingredients. Avoid canned, frozen, or packaged meals.  Read food labels to see how much sodium is in the food. Know how much sodium you are allowed each day.  When eating out, ask for dressings and sauces on the side.  Weigh yourself every morning with an empty bladder before you eat or drink. If your weight is going up, you are retaining fluid.  Freeze fruit juice or water in an ice cube tray. Use this as part of your fluid allowance.  Brush your teeth often or rinse your mouth with mouthwash to help your dry mouth. Lemon wedges, hard sour candies, chewing gum, or breath spray may help to moisten your mouth too.  Add a slice of fresh lemon or lemon juice to water or ice. This helps satisfy your thirst.  Try frozen fruits between meals, such as grapes or strawberries.  Swallow your pills along with meals or soft foods. This helps you save your fluid for something you enjoy.  Use small cups and glasses and learn to sip fluids slowly.  Keep your home cooler. Keep the air in your home as humid as possible. Dry air increases thirst.  Avoid being out in the hot sun. Each morning, fill a jug with the amount of water you are allowed for the day. You can use this water as a guideline for fluid allowance. Each time you take in   fluid, pour an equal amount of water out of the container. This helps you to see how much fluid you are taking in. It also helps plan your fluid intake for the rest of the day. CONVERSIONS TO HELP MEASURE FLUID INTAKE  1 cup equals 8 oz (240 mL).   cup equals 6 oz (180 mL).   cup equals 5  oz (160 mL).   cup equals 4 oz (120 mL).   cup equals 2  oz (80 mL).   cup equals 2 oz (60 mL).  2 tbs equals 1 oz (30 mL). Document Released: 04/25/2007 Document Revised: 12/28/2011 Document Reviewed: 12/10/2011 ExitCare Patient Information 2015 [employer]. This information is  not intended to replace advice given to you by your health care provider. Make sure you discuss any questions you have with your health care provider.  

## 2015-01-01 NOTE — Addendum Note (Signed)
Addended by: Chevis Pretty on: 01/01/2015 11:46 AM   Modules accepted: Orders

## 2015-01-01 NOTE — Progress Notes (Signed)
Subjective:    Patient ID: Frederick Foster, male    DOB: October 21, 1950, 64 y.o.   MRN: [id number]  HPI Patient here today for follow up of chronic medical problems. He has been in and out of the hospital over 7 times in the last year.  He was just discharged last week with fluid build up in lungs.He has had heart problems and breathing problems and has had several abdominal surgeries. He is doing ok right now- his only complaint is fatigue, but he says that that is improving slowly. His blood sugars have been running around 180 fasting. He is trying to watch diet. No C/O SOB or chest pain.  Patient Active Problem List   Diagnosis Date Noted  . Sick sinus syndrome 05/21/2013  . Cardiac pacemaker in situ 04/07/2013  . Hyperkalemia 04/07/2013  . Bladder cancer 02/23/2013  . Arteriosclerosis of coronary artery 01/15/2013  . Depression with anxiety 10/23/2012  . Chronic diastolic CHF (congestive heart failure) 04/26/2012  . CKD (chronic kidney disease), stage III 04/26/2012  . CHB (complete heart block) 04/25/2012  . Acute on chronic diastolic heart failure 29/92/4268  . Atrial flutter 11/19/2011  . Lymphoma 06/22/2011  . DM (diabetes mellitus), type 2, uncontrolled 05/17/2011  . Solitary kidney 05/16/2011  . Hyponatremia 05/16/2011  . HTN (hypertension) 05/13/2011  . Hyperlipidemia with target LDL less than 100 10/22/2008  . HERNIA, VENTRAL 10/22/2008  . Carotid bruit 10/22/2008  . CORONARY ARTERY BYPASS GRAFT, TWO VESSEL, HX OF 10/22/2008   Outpatient Encounter Prescriptions as of 01/01/2015  Medication Sig Note  . ALPRAZolam (XANAX) 1 MG tablet Take 1 tablet (1 mg total) by mouth 2 (two) times daily as needed for anxiety or sleep.   Marland Kitchen aspirin 81 MG tablet Take 81 mg by mouth daily.  11/20/2013: Received from: External Pharmacy  . atorvastatin (LIPITOR) 40 MG tablet Take 40 mg by mouth daily. 12/13/2014: Received from: External Pharmacy Received Sig:   . carvedilol (COREG) 3.125 MG tablet  Take 1 tablet (3.125 mg total) by mouth 2 (two) times daily with a meal.   . citalopram (CELEXA) 20 MG tablet Take 1 tablet (20 mg total) by mouth every evening.   . furosemide (LASIX) 40 MG tablet Take 3 tablets by mouth daily. 01/01/2015: Received from: External Pharmacy Received Sig:   . glucose blood (ONETOUCH VERIO) test strip Check blood sugar 1x per day E11.9   . HYDROcodone-acetaminophen (NORCO/VICODIN) 5-325 MG per tablet Take 1 tablet by mouth every 6 (six) hours as needed for moderate pain.   . Insulin Glargine (TOUJEO SOLOSTAR) 300 UNIT/ML SOPN Inject 65 Units into the skin daily. BID (Patient taking differently: Inject 40 Units into the skin daily. ) 11/25/2014: Currently taking 40 units everyday.   . nitroGLYCERIN (NITROSTAT) 0.4 MG SL tablet Place 0.4 mg under the tongue. 09/11/2014: Received from: [facility]  . oxyCODONE (OXY IR/ROXICODONE) 5 MG immediate release tablet Take 2.5-5 mg by mouth. 12/13/2014: Received from: [facility]   Facility-Administered Encounter Medications as of 01/01/2015  Medication  . 0.9 %  sodium chloride infusion  . benzocaine (HURRICAINE) 20 % mouth spray 1 application  . fentaNYL (SUBLIMAZE) injection 250 mcg  . midazolam (VERSED) injection 10 mg       Review of Systems  Constitutional: Positive for appetite change and fatigue.  HENT: Negative.   Respiratory: Negative for shortness of breath.   Cardiovascular: Negative.   Gastrointestinal: Negative.   Genitourinary: Negative.  Neurological: Negative.   Psychiatric/Behavioral: Negative.        Objective:   Physical Exam  Constitutional: He is oriented to person, place, and time. He appears well-developed and well-nourished.  HENT:  Head: Normocephalic.  Right Ear: External ear normal.  Left Ear: External ear normal.  Nose: Nose normal.  Mouth/Throat: Oropharynx is clear and moist.  Eyes: EOM are normal. Pupils are equal, round, and reactive  to light.  Neck: Normal range of motion. Neck supple. No JVD present. No thyromegaly present.  Cardiovascular: Normal rate, regular rhythm, normal heart sounds and intact distal pulses.  Exam reveals no gallop and no friction rub.   No murmur heard. Pulmonary/Chest: Effort normal and breath sounds normal. No respiratory distress. He has no wheezes. He has no rales. He exhibits no tenderness.  Abdominal: Soft. Bowel sounds are normal. He exhibits no mass. There is no tenderness.  cystostomy bag intact- stump clear  Genitourinary: Prostate normal and penis normal.  Musculoskeletal: Normal range of motion. He exhibits no edema.  Lymphadenopathy:    He has no cervical adenopathy.  Neurological: He is alert and oriented to person, place, and time. No cranial nerve deficit.  Skin: Skin is warm and dry. There is pallor.  Psychiatric: He has a normal mood and affect. His behavior is normal. Judgment and thought content normal.   BP 100/62 mmHg  Pulse 67  Temp(Src) 97.8 F (36.6 C) (Oral)  Ht 6' (1.829 m)  Wt 228 lb (103.42 kg)  BMI 30.92 kg/m2    Results for orders placed or performed in visit on 01/01/15  POCT glycosylated hemoglobin (Hb A1C)  Result Value Ref Range   Hemoglobin A1C 7.6          Assessment & Plan:   1. Hyperlipidemia with target LDL less than 100   2. Depression with anxiety   3. Sick sinus syndrome   4. Essential hypertension   5. CORONARY ARTERY BYPASS GRAFT, TWO VESSEL, HX OF   6. Chronic diastolic CHF (congestive heart failure)   7. Acute on chronic diastolic heart failure   8. CKD (chronic kidney disease), stage III   9. Hyponatremia   10. Hyperkalemia   11. Type 2 diabetes mellitus with other circulatory complications   12. Cardiac pacemaker in situ   13. Bilateral carotid bruits    Meds ordered this encounter  Medications  . furosemide (LASIX) 40 MG tablet    Sig: Take 3 tablets by mouth daily.  . citalopram (CELEXA) 20 MG tablet    Sig: Take 1  tablet (20 mg total) by mouth every evening.    Dispense:  90 tablet    Refill:  1  . ALPRAZolam (XANAX) 1 MG tablet    Sig: Take 1 tablet (1 mg total) by mouth 2 (two) times daily as needed for anxiety or sleep.    Dispense:  60 tablet    Refill:  1    Do not fill till 01/11/15    Order Specific Question:  Supervising Provider    Answer:  Chipper Herb [[id number]]   Diet and exercise encouraged Keep check of daily weights- if > 2 lbs increase in one day let me know Keep follow up appointment with cardiologist and nephrologist RTO in 3 months and prn  Mary-Margaret Hassell Done, FNP

## 2015-01-02 LAB — MAGNESIUM, RBC: Magnesium RBC: 6.2 mg/dL (ref 4.2–6.8)

## 2015-01-02 LAB — CMP14+EGFR
ALBUMIN: 3.3 g/dL — AB (ref 3.6–4.8)
ALT: 10 IU/L (ref 0–44)
AST: 12 IU/L (ref 0–40)
Albumin/Globulin Ratio: 1.1 (ref 1.1–2.5)
Alkaline Phosphatase: 81 IU/L (ref 39–117)
BILIRUBIN TOTAL: 0.7 mg/dL (ref 0.0–1.2)
BUN/Creatinine Ratio: 9 — ABNORMAL LOW (ref 10–22)
BUN: 17 mg/dL (ref 8–27)
CALCIUM: 8.4 mg/dL — AB (ref 8.6–10.2)
CO2: 25 mmol/L (ref 18–29)
Chloride: 93 mmol/L — ABNORMAL LOW (ref 97–108)
Creatinine, Ser: 1.92 mg/dL — ABNORMAL HIGH (ref 0.76–1.27)
GFR calc Af Amer: 41 mL/min/{1.73_m2} — ABNORMAL LOW (ref >59)
GFR, EST NON AFRICAN AMERICAN: 36 mL/min/{1.73_m2} — AB (ref >59)
GLUCOSE: 282 mg/dL — AB (ref 65–99)
Globulin, Total: 3 g/dL (ref 1.5–4.5)
POTASSIUM: 3.9 mmol/L (ref 3.5–5.2)
Sodium: 134 mmol/L (ref 134–144)
TOTAL PROTEIN: 6.3 g/dL (ref 6.0–8.5)

## 2015-01-02 LAB — LIPID PANEL
Chol/HDL Ratio: 3.2 ratio units (ref 0.0–5.0)
Cholesterol, Total: 100 mg/dL (ref 100–199)
HDL: 31 mg/dL — AB (ref >39)
LDL Calculated: 50 mg/dL (ref 0–99)
TRIGLYCERIDES: 97 mg/dL (ref 0–149)
VLDL CHOLESTEROL CAL: 19 mg/dL (ref 5–40)

## 2015-01-03 ENCOUNTER — Encounter: Payer: Self-pay | Admitting: Physician Assistant

## 2015-01-03 ENCOUNTER — Ambulatory Visit (INDEPENDENT_AMBULATORY_CARE_PROVIDER_SITE_OTHER): Payer: Commercial Managed Care - HMO | Admitting: Physician Assistant

## 2015-01-03 ENCOUNTER — Other Ambulatory Visit: Payer: Self-pay | Admitting: Physician Assistant

## 2015-01-03 VITALS — BP 95/55 | HR 74 | Temp 97.1°F | Ht 72.0 in | Wt 225.0 lb

## 2015-01-03 DIAGNOSIS — J069 Acute upper respiratory infection, unspecified: Secondary | ICD-10-CM

## 2015-01-03 DIAGNOSIS — J020 Streptococcal pharyngitis: Secondary | ICD-10-CM | POA: Diagnosis not present

## 2015-01-03 LAB — POCT RAPID STREP A (OFFICE): Rapid Strep A Screen: NEGATIVE

## 2015-01-03 MED ORDER — AZITHROMYCIN 250 MG PO TABS: ORAL_TABLET | ORAL | Status: DC

## 2015-01-03 MED ORDER — FLUTICASONE PROPIONATE 50 MCG/ACT NA SUSP: 2.0000 | Freq: Every day | NASAL | Status: AC

## 2015-01-03 NOTE — Progress Notes (Signed)
   Subjective:    Patient ID: Frederick Foster, male    DOB: 08/19/1950, 64 y.o.   MRN: [id number]  HPI 64 y/o male presetns with c/o sore throat,hoarseness x 4 days. Has not tried any medications for relief. States that his wife saw "white patches " in it last night     Review of Systems  Constitutional: Negative.   HENT: Positive for congestion (nasal ), ear pain (behind left ear ), postnasal drip, rhinorrhea and sore throat. Negative for sneezing.   Eyes: Negative.   Respiratory: Positive for cough (nonproductive ).   Cardiovascular: Negative.   Gastrointestinal: Positive for nausea.  All other systems reviewed and are negative.      Objective:   Physical Exam  Constitutional: He is oriented to person, place, and time. He appears well-developed and well-nourished. No distress.  HENT:  Head: Normocephalic.  Posterior pharynx injection and erythema bilaterally Left TM slightly erythematous and bulging  Eyes: Right eye exhibits no discharge. Left eye exhibits no discharge.  Cardiovascular: Normal rate and regular rhythm.   No murmur heard. Pacemaker in place   Pulmonary/Chest: Effort normal and breath sounds normal. No respiratory distress. He has no wheezes. He has no rales. He exhibits no tenderness.  Neurological: He is alert and oriented to person, place, and time.  Skin: He is not diaphoretic.  Psychiatric: He has a normal mood and affect. His behavior is normal. Judgment and thought content normal.  Nursing note and vitals reviewed.         Assessment & Plan:  1. Streptococcal sore throat  - POCT rapid strep A - Culture, Group A Strep - fluticasone (FLONASE) 50 MCG/ACT nasal spray; Place 2 sprays into both nostrils daily.  Dispense: 16 g; Refill: 6  2. Acute upper respiratory infection  - fluticasone (FLONASE) 50 MCG/ACT nasal spray; Place 2 sprays into both nostrils daily.  Dispense: 16 g; Refill: 6 - azithromycin (ZITHROMAX) 250 MG tablet; 2 tabs on day 1, 1  tab on day 2-5  Dispense: 6 tablet; Refill: 0   Continue all meds Labs pending Health Maintenance reviewed Diet and exercise encouraged RTO prn   Kyre Jeffries A. Benjamin Stain PA-C

## 2015-01-03 NOTE — Patient Instructions (Signed)
Take over the counter Zyrtec

## 2015-01-06 LAB — CULTURE, GROUP A STREP

## 2015-01-17 ENCOUNTER — Encounter: Payer: Self-pay | Admitting: *Deleted

## 2015-01-17 ENCOUNTER — Ambulatory Visit (INDEPENDENT_AMBULATORY_CARE_PROVIDER_SITE_OTHER): Payer: Commercial Managed Care - HMO | Admitting: *Deleted

## 2015-01-17 VITALS — BP 97/57 | HR 81 | Ht 70.0 in | Wt 220.0 lb

## 2015-01-17 DIAGNOSIS — Z23 Encounter for immunization: Secondary | ICD-10-CM

## 2015-01-17 DIAGNOSIS — Z0000 Encounter for general adult medical examination without abnormal findings: Secondary | ICD-10-CM | POA: Diagnosis not present

## 2015-01-17 NOTE — Patient Instructions (Addendum)
Continue current medications.  Check cost of Zostavax at next appointment.  Keep follow up appointments. Do chair exercises as tolerated to build up strength. Refer to handout.  Review Advance Directives and if completed bring a copy to our office. Stand slowly and move carefully to avoid falls.    Pneumococcal Vaccine, Polyvalent suspension for injection What is this medicine? PNEUMOCOCCAL VACCINE, POLYVALENT (NEU mo KOK al vak SEEN, pol ee VEY luhnt) is a vaccine to prevent pneumococcus bacteria infection. These bacteria are a major cause of ear infections, 'Strep throat' infections, and serious pneumonia, meningitis, or blood infections worldwide. These vaccines help the body to produce antibodies (protective substances) that help your body defend against these bacteria. This vaccine is recommended for infants and young children. This vaccine will not treat an infection. This medicine may be used for other purposes; ask your health care provider or pharmacist if you have questions. COMMON BRAND NAME(S): Prevnar 13 What should I tell my health care provider before I take this medicine? They need to know if you have any of these conditions: -bleeding problems -fever -immune system problems -low platelet count in the blood -seizures -an unusual or allergic reaction to pneumococcal vaccine, diphtheria toxoid, other vaccines, latex, other medicines, foods, dyes, or preservatives -pregnant or trying to get pregnant -breast-feeding How should I use this medicine? This vaccine is for injection into a muscle. It is given by a health care professional. A copy of Vaccine Information Statements will be given before each vaccination. Read this sheet carefully each time. The sheet may change frequently. Talk to your pediatrician regarding the use of this medicine in children. While this drug may be prescribed for children as young as 67 weeks old for selected conditions, precautions do  apply. Overdosage: If you think you have taken too much of this medicine contact a poison control center or emergency room at once. NOTE: This medicine is only for you. Do not share this medicine with others. What if I miss a dose? It is important not to miss your dose. Call your doctor or health care professional if you are unable to keep an appointment. What may interact with this medicine? -medicines for cancer chemotherapy -medicines that suppress your immune function -medicines that treat or prevent blood clots like warfarin, enoxaparin, and dalteparin -steroid medicines like prednisone or cortisone This list may not describe all possible interactions. Give your health care provider a list of all the medicines, herbs, non-prescription drugs, or dietary supplements you use. Also tell them if you smoke, drink alcohol, or use illegal drugs. Some items may interact with your medicine. What should I watch for while using this medicine? Mild fever and pain should go away in 3 days or less. Report any unusual symptoms to your doctor or health care professional. What side effects may I notice from receiving this medicine? Side effects that you should report to your doctor or health care professional as soon as possible: -allergic reactions like skin rash, itching or hives, swelling of the face, lips, or tongue -breathing problems -confused -fever over 102 degrees F -pain, tingling, numbness in the hands or feet -seizures -unusual bleeding or bruising -unusual muscle weakness Side effects that usually do not require medical attention (report to your doctor or health care professional if they continue or are bothersome): -aches and pains -diarrhea -fever of 102 degrees F or less -headache -irritable -loss of appetite -pain, tender at site where injected -trouble sleeping This list may not describe all possible side  effects. Call your doctor for medical advice about side effects. You may  report side effects to FDA at 1-800-FDA-1088. Where should I keep my medicine? This does not apply. This vaccine is given in a clinic, pharmacy, doctor's office, or other health care setting and will not be stored at home. NOTE: This sheet is a summary. It may not cover all possible information. If you have questions about this medicine, talk to your doctor, pharmacist, or health care provider.  2015, Elsevier/Gold Standard. (2008-09-10 10:17:22)  Health Maintenance A healthy lifestyle and preventative care can promote health and wellness.  Maintain regular health, dental, and eye exams.  Eat a healthy diet. Foods like vegetables, fruits, whole grains, low-fat dairy products, and lean protein foods contain the nutrients you need and are low in calories. Decrease your intake of foods high in solid fats, added sugars, and salt. Get information about a proper diet from your health care provider, if necessary.  Regular physical exercise is one of the most important things you can do for your health. Most adults should get at least 150 minutes of moderate-intensity exercise (any activity that increases your heart rate and causes you to sweat) each week. In addition, most adults need muscle-strengthening exercises on 2 or more days a week.   Maintain a healthy weight. The body mass index (BMI) is a screening tool to identify possible weight problems. It provides an estimate of body fat based on height and weight. Your health care provider can find your BMI and can help you achieve or maintain a healthy weight. For males 20 years and older:  A BMI below 18.5 is considered underweight.  A BMI of 18.5 to 24.9 is normal.  A BMI of 25 to 29.9 is considered overweight.  A BMI of 30 and above is considered obese.  Maintain normal blood lipids and cholesterol by exercising and minimizing your intake of saturated fat. Eat a balanced diet with plenty of fruits and vegetables. Blood tests for lipids and  cholesterol should begin at age 52 and be repeated every 5 years. If your lipid or cholesterol levels are high, you are over age 12, or you are at high risk for heart disease, you may need your cholesterol levels checked more frequently.Ongoing high lipid and cholesterol levels should be treated with medicines if diet and exercise are not working.  If you smoke, find out from your health care provider how to quit. If you do not use tobacco, do not start.  Lung cancer screening is recommended for adults aged 6-80 years who are at high risk for developing lung cancer because of a history of smoking. A yearly low-dose CT scan of the lungs is recommended for people who have at least a 30-pack-year history of smoking and are current smokers or have quit within the past 15 years. A pack year of smoking is smoking an average of 1 pack of cigarettes a day for 1 year (for example, a 30-pack-year history of smoking could mean smoking 1 pack a day for 30 years or 2 packs a day for 15 years). Yearly screening should continue until the smoker has stopped smoking for at least 15 years. Yearly screening should be stopped for people who develop a health problem that would prevent them from having lung cancer treatment.  If you choose to drink alcohol, do not have more than 2 drinks per day. One drink is considered to be 12 oz (360 mL) of beer, 5 oz (150 mL) of wine,  or 1.5 oz (45 mL) of liquor.  Avoid the use of street drugs. Do not share needles with anyone. Ask for help if you need support or instructions about stopping the use of drugs.  High blood pressure causes heart disease and increases the risk of stroke. Blood pressure should be checked at least every 1-2 years. Ongoing high blood pressure should be treated with medicines if weight loss and exercise are not effective.  If you are 38-24 years old, ask your health care provider if you should take aspirin to prevent heart disease.  Diabetes screening involves  taking a blood sample to check your fasting blood sugar level. This should be done once every 3 years after age 18 if you are at a normal weight and without risk factors for diabetes. Testing should be considered at a younger age or be carried out more frequently if you are overweight and have at least 1 risk factor for diabetes.  Colorectal cancer can be detected and often prevented. Most routine colorectal cancer screening begins at the age of 36 and continues through age 60. However, your health care provider may recommend screening at an earlier age if you have risk factors for colon cancer. On a yearly basis, your health care provider may provide home test kits to check for hidden blood in the stool. A small camera at the end of a tube may be used to directly examine the colon (sigmoidoscopy or colonoscopy) to detect the earliest forms of colorectal cancer. Talk to your health care provider about this at age 80 when routine screening begins. A direct exam of the colon should be repeated every 5-10 years through age 7, unless early forms of precancerous polyps or small growths are found.  People who are at an increased risk for hepatitis B should be screened for this virus. You are considered at high risk for hepatitis B if:  You were born in a country where hepatitis B occurs often. Talk with your health care provider about which countries are considered high risk.  Your parents were born in a high-risk country and you have not received a shot to protect against hepatitis B (hepatitis B vaccine).  You have HIV or AIDS.  You use needles to inject street drugs.  You live with, or have sex with, someone who has hepatitis B.  You are a man who has sex with other men (MSM).  You get hemodialysis treatment.  You take certain medicines for conditions like cancer, organ transplantation, and autoimmune conditions.  Hepatitis C blood testing is recommended for all people born from [birth year] through 1967  and any individual with known risk factors for hepatitis C.  Healthy men should no longer receive prostate-specific antigen (PSA) blood tests as part of routine cancer screening. Talk to your health care provider about prostate cancer screening.  Testicular cancer screening is not recommended for adolescents or adult males who have no symptoms. Screening includes self-exam, a health care provider exam, and other screening tests. Consult with your health care provider about any symptoms you have or any concerns you have about testicular cancer.  Practice safe sex. Use condoms and avoid high-risk sexual practices to reduce the spread of sexually transmitted infections (STIs).  You should be screened for STIs, including gonorrhea and chlamydia if:  You are sexually active and are younger than 24 years.  You are older than 24 years, and your health care provider tells you that you are at risk for this type of infection.  Your sexual activity has changed since you were last screened, and you are at an increased risk for chlamydia or gonorrhea. Ask your health care provider if you are at risk.  If you are at risk of being infected with HIV, it is recommended that you take a prescription medicine daily to prevent HIV infection. This is called pre-exposure prophylaxis (PrEP). You are considered at risk if:  You are a man who has sex with other men (MSM).  You are a heterosexual man who is sexually active with multiple partners.  You take drugs by injection.  You are sexually active with a partner who has HIV.  Talk with your health care provider about whether you are at high risk of being infected with HIV. If you choose to begin PrEP, you should first be tested for HIV. You should then be tested every 3 months for as long as you are taking PrEP.  Use sunscreen. Apply sunscreen liberally and repeatedly throughout the day. You should seek shade when your shadow is shorter than you. Protect  yourself by wearing long sleeves, pants, a wide-brimmed hat, and sunglasses year round whenever you are outdoors.  Tell your health care provider of new moles or changes in moles, especially if there is a change in shape or color. Also, tell your health care provider if a mole is larger than the size of a pencil eraser.  A one-time screening for abdominal aortic aneurysm (AAA) and surgical repair of large AAAs by ultrasound is recommended for men aged 29-75 years who are current or former smokers.  Stay current with your vaccines (immunizations). Document Released: 12/25/2007 Document Revised: 07/03/2013 Document Reviewed: 11/23/2010 [facility] Patient Information 2015 [place], [place]. This information is not intended to replace advice given to you by your health care provider. Make sure you discuss any questions you have with your health care provider.

## 2015-01-17 NOTE — Progress Notes (Signed)
Patient ID: [name], male   DOB: 03-04-1951, 64 y.o.   MRN: [id number]    Subjective:   Frederick Foster is a 64 y.o. male who presents for an Initial Medicare Annual Wellness Visit. Frederick Foster is married and lives at home with his wife. He has 4 children, 9 grandchildren and 3 great grandchildren. He is retired from the [employer] and is active in his church. He has an extensive surgical history due to heart disease and cancer. He is currently on dialysis. He seems to be in good spirits despite this.   Review of Systems   Cardiac Risk Factors include: advanced age (>21men, >41 women);sedentary lifestyle;obesity (BMI >30kg/m2);male gender;hypertension   Urinary: On dialysis and wears a urine collection bag due to having bladder removed  Other systems negative    Objective:    Today's Vitals   01/17/15 1035  BP: 97/57  Pulse: 81  Height: $Remove'5\' 10"'uaTlxvW$  (1.778 m)  Weight: 220 lb (99.791 kg)    Current Medications (verified) Outpatient Encounter Prescriptions as of 01/17/2015  Medication Sig  . ALPRAZolam (XANAX) 1 MG tablet Take 1 tablet (1 mg total) by mouth 2 (two) times daily as needed for anxiety or sleep.  Marland Kitchen aspirin 81 MG tablet Take 81 mg by mouth daily.   Marland Kitchen atorvastatin (LIPITOR) 40 MG tablet Take 40 mg by mouth daily.  . carvedilol (COREG) 3.125 MG tablet Take 1 tablet (3.125 mg total) by mouth 2 (two) times daily with a meal.  . citalopram (CELEXA) 20 MG tablet Take 1 tablet (20 mg total) by mouth every evening.  . fluticasone (FLONASE) 50 MCG/ACT nasal spray Place 2 sprays into both nostrils daily.  . furosemide (LASIX) 40 MG tablet Take 3 tablets by mouth daily.  Marland Kitchen glucose blood (ONETOUCH VERIO) test strip Check blood sugar 1x per day E11.9  . HYDROcodone-acetaminophen (NORCO/VICODIN) 5-325 MG per tablet Take 1 tablet by mouth every 6 (six) hours as needed for moderate pain.  . Insulin Glargine (TOUJEO SOLOSTAR) 300 UNIT/ML SOPN Inject 65 Units into the skin daily. BID  (Patient taking differently: Inject 40 Units into the skin daily. )  . nitroGLYCERIN (NITROSTAT) 0.4 MG SL tablet Place 0.4 mg under the tongue.  . [DISCONTINUED] azithromycin (ZITHROMAX) 250 MG tablet 2 tabs on day 1, 1 tab on day 2-5  . [DISCONTINUED] oxyCODONE (OXY IR/ROXICODONE) 5 MG immediate release tablet Take 2.5-5 mg by mouth.   Facility-Administered Encounter Medications as of 01/17/2015  Medication  . 0.9 %  sodium chloride infusion  . benzocaine (HURRICAINE) 20 % mouth spray 1 application  . fentaNYL (SUBLIMAZE) injection 250 mcg  . midazolam (VERSED) injection 10 mg    Allergies (verified) Penicillins; Other; and Sulfonamide derivatives   History: Past Medical History  Diagnosis Date  . Mixed hyperlipidemia   . 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 HERNIA, VENTRAL   . CAROTID BRUIT   . Coronary artery disease     h/o MI and bypass surgery x 3 in 8937 complicated by sternal wound infection, sees Dr. Johnsie Cancel  . Vein symptom     injury to left leg due to conveyor belt.  caused injury to veins in left leg/ s/p surgery   . Sinus congestion     has allergies  . AODM   . Hiatal hernia     s/p hiatal hernia surgery 2010  . Anxiety  history of panic attacks  . Depression     takes celexa  . Atrial flutter 11/19/2011  . Atrial fibrillation   . Open abdominal wall wound   . Cancer      kidney ca, also mass to left neck  . Lymphoma   . Bladder cancer   . Prostate cancer   . Non Hodgkin's lymphoma   . CKD (chronic kidney disease), stage III     has one kidney.  had left kidney removed in 2011 for cancer  . Chronic kidney disease, stage III (moderate)   . Acute renal failure 04/06/2013    With hyperkalemia and met acidosis   Past Surgical History  Procedure Laterality Date  . Cholecystectomy    . Kidney surgery    . Hernia repair      2010  . Coronary artery bypass graft      x3 .  Dr. Florence Canner is  present cardiologist  . Vein surgery      s/p injury to left leg from conveyor belt  . Cardiac catheterization      2009  . Portacath placement    . Tee without cardioversion  01/07/2012    Procedure: TRANSESOPHAGEAL ECHOCARDIOGRAM (TEE);  Surgeon: Fay Records, MD;  Location: [facility] ENDOSCOPY;  Service: Cardiovascular;  Laterality: N/A;  . Atrial flutter ablation  01/07/12    CTI ablation by Dr Rayann Heman  . Bladder surgery    . Prostate surgery    . Temporary pacemaker insertion    . Atrial flutter ablation N/A 01/07/2012    Procedure: ATRIAL FLUTTER ABLATION;  Surgeon: Thompson Grayer, MD;  Location: [facility] CATH LAB;  Service: Cardiovascular;  Laterality: N/A;  . Bladder surgery     Family History  Problem Relation Age of Onset  . Heart disease Mother    Social History   Occupational History  . Retired Risk analyst    Social History Main Topics  . Smoking status: Former Smoker -- 1.00 packs/day for 40 years    Types: Cigarettes    Quit date: 11/22/2009  . Smokeless tobacco: Never Used  . Alcohol Use: No     Comment: has not drank since 1991  . Drug Use: No  . Sexual Activity: Not Currently    Activities of Daily Living In your present state of health, do you have any difficulty performing the following activities: 01/17/2015 11/25/2014  Hearing? N N  Vision? N N  Difficulty concentrating or making decisions? N N  Walking or climbing stairs? Y N  Dressing or bathing? N N  Doing errands, shopping? N N  Preparing Food and eating ? N N  Using the Toilet? N N  In the past six months, have you accidently leaked urine? (No Data) N  Do you have problems with loss of bowel control? N N  Managing your Medications? N N  Managing your Finances? N N  Housekeeping or managing your Housekeeping? N N  -He does have difficulty climbing stairs due to general weakness. -He does not have a bladder and wears a urine collection bag  Immunizations and Health Maintenance Immunization History    Administered Date(s) Administered  . Influenza Split 03/20/2013  . Pneumococcal Conjugate-13 01/17/2015  . Pneumococcal Polysaccharide-23 08/12/2009   Health Maintenance Due  Topic Date Due  . PNA vac Low Risk Adult (1 of 2 - PCV13) 06/10/2014  . OPHTHALMOLOGY EXAM  07/13/2014  Reports having eye exam while at [facility] last year. I will try to obtain those  records.   Patient Care Team: Chevis Pretty, FNP as PCP - General (Nurse Practitioner) Standley Brooking, RN as [place] Management Inda Castle, MD as Consulting Physician (Gastroenterology) Josue Hector, MD as Consulting Physician (Cardiology) Myrlene Broker, MD as Attending Physician (Urology)   Indicate any recent Medical Services you may have received from other than [facility] providers in the past year (date may be approximate). -Dialysis 3 times weekly in [place], [place] -Visit with Tresa Endo, MD (urology) 12/23/14, 12/02/14. -Urethral bx at [facility] 10/15/14 and I&D 10/22/14     Assessment:   This is a routine wellness examination for Frederick Foster.   Hearing/Vision screen No hearing or vision deficits noted during visit  Dietary issues and exercise activities discussed: Current Exercise Habits:: Exercise is limited by, Limited by:: Other - see comments (weakness due to dialysis and hx of cancer)  Depression Screen PHQ 2/9 Scores 01/17/2015 01/01/2015 12/13/2014 11/25/2014  PHQ - 2 Score $Remov'2 2 1 1  'dRNrEZ$ PHQ- 9 Score 5 - - -   -Patient is on medication  Fall Risk Fall Risk  01/17/2015 01/01/2015 12/13/2014 11/25/2014 06/10/2014  Falls in the past year? No No Yes No No  Number falls in past yr: - - 1 - -  Injury with Fall? - - No - -  Risk for fall due to : - - - - -    Cognitive Function: MMSE - Mini Mental State Exam 01/17/2015  Orientation to time 4  Orientation to Place 5  Registration 3  Attention/ Calculation 5  Recall 3  Language- name 2 objects 2  Language- repeat 1  Language- follow 3 step command 3   Language- read & follow direction 1  Write a sentence 1  Copy design 1  Total score 29  -No deficit apparent deficit  Screening Tests Health Maintenance  Topic Date Due  . PNA vac Low Risk Adult (1 of 2 - PCV13) 06/10/2014  . OPHTHALMOLOGY EXAM  07/13/2014  . ZOSTAVAX  03/14/2015 (Originally 06/10/2009)  . HIV Screening  09/11/2015 (Originally 06[birth year]10/1964)  . INFLUENZA VACCINE  02/10/2015  . HEMOGLOBIN A1C  07/03/2015  . URINE MICROALBUMIN  09/11/2015  . COLON CANCER SCREENING ANNUAL FOBT  09/20/2015  . FOOT EXAM  01/01/2016  . COLONOSCOPY  05/08/2019  . TETANUS/TDAP  06/11/2022        Plan:  Continue current medications.  Check cost of Zostavax at next appointment.  Keep follow up appointments. Do chair exercises as tolerated to build up strength. Refer to handout.  Review Advance Directives and if completed bring a copy to our office. Stand slowly and move carefully to avoid falls.   During the course of the visit Frederick Foster was educated and counseled about the following appropriate screening and preventive services:   Vaccines to include Pneumoccal-complete, Influenza-suggested annually, TD-Pt reported that he had one at [facility] while hospitalized last year. I can't find a record so he may need this at his next visit if not too costly, Zostavax-too expensive  Electrocardiogram-Done 12/23/14  Colorectal cancer screening-FOBT done 09/17/14, colonoscopy due 2020  Glaucoma screening-Suggested annually. Will obtain copy of last exam.   Nutrition counseling-Balanced diet with lean proteins, vegetables, and small amount of fruit that isn't overly ripe. Low glycemic foods are best.   Patient Instructions (the written plan) were given to the patient.   Chong Sicilian, RN   01/17/2015       I have reviewed and agree with the above AWV documentation.  Warren Stacks, M.D.  

## 2015-01-20 ENCOUNTER — Other Ambulatory Visit: Payer: Medicare HMO

## 2015-01-20 NOTE — Patient Outreach (Signed)
[place] [facility]) Care Management  01/20/2015  Frederick Foster May 30, 1951 [id number]   Telephonic RN CM outreach call #1.  Member not reached.  Line Busy x 3 attempts today.   RN CM will reschedule for outreach.   Mariann Laster, RN, BSN, [facility], CCM  [employer] Management Coordinator [phone] Office [phone] Direct [phone] Cell

## 2015-01-27 ENCOUNTER — Other Ambulatory Visit: Payer: Medicare HMO

## 2015-01-27 NOTE — Patient Outreach (Signed)
[place] [facility]) Care Management  01/27/2015  Frederick Foster 05/08/1951 [id number]   Telephonic Care Management Note   Telephonic RN CM outreach call #2 for monthly telephonic assessment. Member not reached. Voice message left with name and contact #.  RN CM will reschedule for next  outreach.   Mariann Laster, RN, BSN, [facility], CCM  [employer] Management Coordinator [phone] Office [phone] Direct [phone] Cell

## 2015-01-29 ENCOUNTER — Other Ambulatory Visit: Payer: Medicare HMO

## 2015-01-29 NOTE — Patient Outreach (Signed)
  [place] [facility]) Care Management  01/29/2015  SEABORN NAKAMA June 19, 1951 [id number]  Telephonic Care Management Note  Telephonic RN CM outreach call # 3 for monthly telephonic assessment. Member not reached. Voice message left with name and contact #. [phone] - cell - left message [phone] - home - left message   Plan:  RN CM will send unsuccessful outreach letter and follow-up in 10 business days; will close case if no response.   Mariann Laster, RN, BSN, [facility], CCM  [employer] Management Coordinator [phone] Office [phone] Direct [phone] Cell

## 2015-02-03 ENCOUNTER — Other Ambulatory Visit: Payer: Self-pay

## 2015-02-03 NOTE — Patient Outreach (Signed)
[facility]) Care Management  02/03/2015  Frederick Foster 1951-07-10 [id number]  Telephonic Care Management Note  Outreach call #4 to patient's New Cell # [phone].  Patient not reached.  RN CM left HIPAA compliant voice message with name and contact #.   RN CM will follow-up for response by 02/12/15 and close case if no response received from patient.   Mariann Laster, RN, BSN, [facility], CCM  [employer] Management Coordinator [phone] Office [phone] Direct [phone] Cell

## 2015-02-07 ENCOUNTER — Encounter: Payer: Self-pay | Admitting: Nurse Practitioner

## 2015-02-07 ENCOUNTER — Ambulatory Visit (INDEPENDENT_AMBULATORY_CARE_PROVIDER_SITE_OTHER): Payer: Commercial Managed Care - HMO | Admitting: Nurse Practitioner

## 2015-02-07 VITALS — BP 114/69 | HR 81 | Temp 98.6°F | Ht 70.0 in | Wt 222.0 lb

## 2015-02-07 DIAGNOSIS — R590 Localized enlarged lymph nodes: Secondary | ICD-10-CM | POA: Diagnosis not present

## 2015-02-07 MED ORDER — DOXYCYCLINE HYCLATE 100 MG PO TABS: 100.0000 mg | ORAL_TABLET | Freq: Two times a day (BID) | ORAL | Status: DC

## 2015-02-07 NOTE — Progress Notes (Signed)
   Subjective:    Patient ID: Frederick Foster, male    DOB: 03-02-1951, 64 y.o.   MRN: [id number]  HPI Patient in c/o knot on his neck that he noticed 2 weeks ago- he had a cancerous lesion ( non hodgkins lymphoma ) it has  nt changed in size since he found it.     Review of Systems  Constitutional: Negative.   HENT: Negative.   Respiratory: Negative.   Cardiovascular: Negative.   Musculoskeletal: Negative.   Neurological: Negative.   Psychiatric/Behavioral: Negative.   All other systems reviewed and are negative.      Objective:   Physical Exam  Constitutional: He appears well-developed and well-nourished. No distress.  Cardiovascular: Normal rate, regular rhythm and normal heart sounds.   Pulmonary/Chest: Effort normal and breath sounds normal.  Lymphadenopathy:    He has cervical adenopathy (left anterior cervical palpable slightly tender lymphadenopathy).  Skin: Skin is warm.  Psychiatric: He has a normal mood and affect. His behavior is normal. Judgment and thought content normal.    BP 114/69 mmHg  Pulse 81  Temp(Src) 98.6 F (37 C) (Oral)  Ht 5\' 10"  (1.778 m)  Wt 222 lb (100.699 kg)  BMI 31.85 kg/m2       Assessment & Plan:   1. Lymphadenopathy, cervical    Meds ordered this encounter  Medications  . oxyCODONE (OXY IR/ROXICODONE) 5 MG immediate release tablet    Sig:   . doxycycline (VIBRA-TABS) 100 MG tablet    Sig: Take 1 tablet (100 mg total) by mouth 2 (two) times daily. 1 po bid    Dispense:  20 tablet    Refill:  0    Order Specific Question:  Supervising Provider    Answer:  Chipper Herb [[id number]]   If no better next week call and let me know and will order u/s  Mary-Margaret Hassell Done, FNP

## 2015-02-07 NOTE — Patient Instructions (Signed)
Swollen Lymph Nodes  The lymphatic system filters fluid from around cells. It is like a system of blood vessels. These channels carry lymph instead of blood. The lymphatic system is an important part of the immune (disease fighting) system. When people talk about "swollen glands in the neck," they are usually talking about swollen lymph nodes. The lymph nodes are like the little traps for infection. You and your caregiver may be able to feel lymph nodes, especially swollen nodes, in these common areas: the groin (inguinal area), armpits (axilla), and above the clavicle (supraclavicular). You may also feel them in the neck (cervical) and the back of the head just above the hairline (occipital).  Swollen glands occur when there is any condition in which the body responds with an allergic type of reaction. For instance, the glands in the neck can become swollen from insect bites or any type of minor infection on the head. These are very noticeable in children with only minor problems. Lymph nodes may also become swollen when there is a tumor or problem with the lymphatic system, such as Hodgkin's disease.  TREATMENT    Most swollen glands do not require treatment. They can be observed (watched) for a short period of time, if your caregiver feels it is necessary. Most of the time, observation is not necessary.   Antibiotics (medicines that kill germs) may be prescribed by your caregiver. Your caregiver may prescribe these if he or she feels the swollen glands are due to a bacterial (germ) infection. Antibiotics are not used if the swollen glands are caused by a virus.  HOME CARE INSTRUCTIONS    Take medications as directed by your caregiver. Only take over-the-counter or prescription medicines for pain, discomfort, or fever as directed by your caregiver.  SEEK MEDICAL CARE IF:    If you begin to run a temperature greater than 102 F (38.9 C), or as your caregiver suggests.  MAKE SURE YOU:    Understand these  instructions.   Will watch your condition.   Will get help right away if you are not doing well or get worse.  Document Released: 06/18/2002 Document Revised: 09/20/2011 Document Reviewed: 06/28/2005  ExitCare Patient Information 2015 [employer]. This information is not intended to replace advice given to you by your health care provider. Make sure you discuss any questions you have with your health care provider.

## 2015-02-12 ENCOUNTER — Other Ambulatory Visit: Payer: Self-pay

## 2015-02-12 NOTE — Patient Outreach (Signed)
[facility]) Care Management  02/12/2015  Frederick Foster January 31, 1951 [id number]  2nd Successful outreach letter sent to patient due to patient is not able to locate his copy.  Enclosure: [facility] Introductory Package, Consent Form (please sign and return), Pamphlet, Magnet, Advanced Directive document, Blood pressure and weight logging sheet.   Mariann Laster, RN, BSN, [facility], CCM  [employer] Management Coordinator [phone] Office [phone] Direct [phone] Cell

## 2015-02-12 NOTE — Patient Outreach (Signed)
[facility]) Care Management  02/12/2015  Frederick Foster Jan 28, 1951 [id number]   Referral Date: 11/20/14 Referral Source: Frederick Foster [facility] [id number] Issue: Renal failure, cancer, (bladder, prostate, renal cell carcinoma, non-hodgkin's lymphoma; CAD post CABG, HTN, CHF, DM, H/O MI.  H/O IP admissions: 5 Most recent admission to [facility] 10/22/14 - 11/06/14 due to perineal wound infection, wound debridement and drainage. Admission complicated by hypotension, renal failure with acute metabolic acidosis. Treated with CRRT [continuous renal replacement therapy] and transitioned to hemodialysis. Refused recommended SNF at discharge. ED visits: 2 12/20/14 Triage Screening and Initial Assessment Date: 11/25/2014  Interdisciplinary Team: Frederick Foster or Frederick Foster, [place] [phone] - Last appt 02/07/15 Cardiologist:  Last Heart Pacer check  12/26/2014 [facility]: Urologist / Surgeon: Dr. Carin Hock, MD. [phone]- last appt 12/23/14 - next appt 04/25/2015 Urology - [place], [place], [place], [place] [address]  Hemodialysis Tuesday - Thursday - Saturday at [facility] in [place], [place] last used 11/2014.   Social:  Married and lives in his home with his wife; 4 children.  SSD since 2008.  Current contact information verified:  [address] [place], [place] [address] [place] [phone] home [phone] mobile  Depression: Yes. Denies any unmanaged symptoms on current medication regime. Transportation: Patient or wife.  DME: BP cuff, glucometer, scales, wound dressing supplies, cane, walker, W/C, scooter, BSC, hospital bed, urostomy bag & supplies.   Advance Directives: None.  Patient reported on 11/25/14 RN CM contact call; "I have explained to my wife and 4 kids what I would want but I have never completed any document."  States he would want to be a full code until a determination is made that revival is no longer possible.  RN CM reviewed education on the difference between a living will and advance directive.  RN CM mailed patient a copy of AD for review on 11/25/14 but patient states this contact he did not receive and request a 2nd mailing. RN CM will follow up again on future calls to determine patient's decision / wishes to complete formal document. RN CM will mail Advance Directive document to patient.    Diabetes: Diagnosed in 1991 Blood sugar 120-140. States only checks once daily when he feels a need to.  MD order for BS checks is 1 time per day per Epic Medication Order.  A1C 7.6. June 2016 Toujeo 40 Units at bedtime.  Weight: 220 down from 238 in July 2016 and 250 lbs in May 2016.  (Down 30 lbs over 3 months.)  Height: 5'10" Denies any issues with his feet  No Diabetic eye exam for 10+years.  MD follow-up every 3 months for Diabetes care with Frederick Foster or Frederick Foster. RN CM identifies care coordination needs regarding Diabetic testing supplies but patient states he does not need to check daily and has everything he needs.  RN CM provided patient with education that MD order may need to be updated and new health provider [employer] Mail order service needs to be notified if patient has any issues with testing needs.  Education mailed 11/25/14. (reviewed with patient 12/23/14) -Diabetes: Why Get Your A1C Checked? -Diabetes - Eye Care -Diabetes - Preventing Heart Attack and Stroke  RN CM will notify Primary MD of need for diabetic eye exam.  RN CM will contact Primary MD to verify Diabetes Blood Sugar check order.   CHF  H/O admission  09/2014 - fluid overload related to heart failure. Cardiologist: Heart Pacer check scheduled for 12/26/2014 Recent ER visit at [facility] 12/20/14 - C/O shortness of breath and swelling in his abdominal girth and chest. Confirms ER ruled out any  blood clots and confirmed "I had fluid on my heart." States ER did not add or change any medications. States they did not want to start any meds since the [facility] hospital discontinued a lot of his meds and they have not been resumed. States Primary Care visit with Frederick Pretty, NP completed post hospital discharge but MD would not restart meds that [facility]. Patient requested Lasix and ER advised patient if given that he would agree to schedule and keep his Primary MD appt for follow up on CHF management and medication review. Patient states Lasix was given with good output via urostomy bag for about then next 12-24 hours.  Patient confirms seeing Urologist/Surgeon, Frederick Foster this mooring 12/23/2014 who made referral to Cardiology department at [facility] - next appt 02/10/2016 Weight:220 - weighs on dialysis appointments 3 days a week.  RN CM encouraged to report weight gain to MD and keep all MD appt's.   HTN Primary MD has requested patient log and track his BP readings.  Patient states he has his BP checked at dialysis and BP runs low with dialysis.   BP 114/69 on last MD appt 02/07/15  RN CM encouraged patient to check daily and log for MD.  RN CM will send 2nd copy of BP and Weight logging sheet to patient to encourage logging.   Non-hodgkin's lymphoma Patient states he has a NEW lump versus cyst on his neck that the MD is following.  Patient stated he was not taking doxycycline on medication review.  RN CM will follow-up on clarification of this order.   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 Surgical removal only - no radiation or chemo required. States H/O past Chemo with cancer management some years ago but none at present time.  H/O admission to [facility] 10/22/14 - 11/06/14  - perineal wound  infection, wound debridement and drainage. Admission complicated by hypotension, renal failure with acute metabolic acidosis. Treated with CRRT [continuous renal replacement therapy] and transitioned to hemodialysis. Refused recommended SNF at discharge. Urology/Surgeon, Frederick Foster- manages dialysis and cancer care needs. Next appt 04/25/2015  Medications: Patient states he is taking all meds as ordered, getting his refills and not missing any doses.  States no issues affording co-pays.  Insulin co-pay is $45.00 / month but able to afford.  Patient stated he was not taking doxycycline on medication review.  RN CM will follow-up on clarification of this order.   Consent Patient confirmed on 12/23/14 contact call receipt of introductory package but states he has not completed the consent due to so busy with dialysis and appt's. However, patient states today he does not have or receive and request a 2nd mailing.  Patient provided verbal consent for continued [facility] services. RN CM instructed in Consent form which will be enclosed in introductory package; please sign and return to [facility].  RN CM will send 2nd Successful outreach letter with package. RN CM instructed in next follow-up call within 30 days and / or as needed for additional care coordination services.   RN CM advised to please notify MD of any changes in her condition  prior to scheduled appt's.  RN CM provided contact name and #, 24-hour nurse line # [phone].  RN CM confirmed patient is aware of 911 services for urgent emergency needs.  Plan  RN CM will continue Telephonic Case Management 3 - Acuity Level 3 due to care coordination needs. RN CM will mail: successful outreach letter, introductory package, consent, BP/Weight log sheet, advanced directive document.  RN CM will contact Primary MD to address confirmation on doxycycline 02/07/15 order.  RN CM will notify Primary MD of need for diabetic eye exam.  RN CM will  contact Primary MD to verify Diabetes Blood Sugar check order.    Mariann Laster, RN, BSN, [facility], CCM  [employer] Management Coordinator [phone] Office [phone] Direct [phone] Cell

## 2015-02-12 NOTE — Patient Outreach (Signed)
[facility]) Care Management  02/12/2015  LINKEN MCGLOTHEN Mar 16, 1951 [id number]  RN CM sent in-basket to Dr. Tawanna Sat NP:  Chevis Pretty.   1) Patient reports he only checks his BS when he feels like he needs to.  States when he does check it is only once a day.   RN CM would like order verification for daily BS checks in order to provide supportive teaching based on MD order.    2) MD Notification:  Patient reported no diabetic eye exam.  Thanks,   Mariann Laster, RN, BSN, [facility] Management Care Management Coordinator [phone] Office [phone] Direct [phone] Cell

## 2015-02-12 NOTE — Patient Outreach (Signed)
[place] [facility]) Care Management  02/12/2015  MATTOX SCHORR 1950/09/25 [id number]  Telephonic Care Coordination Note  Patient stated he was not taking doxycycline on medication review earlier today. RN CM reviewed last Primary MD note and MD ordered doxycycline (Vibra-Tabs) 100mg  - 1 tablet 100mg  by mouth 2 times daily #20 tabs.   RN CM contacted patient to provide clarification on instructions.   Patient confirms he did feel the prescription and identifies the medication by "antibiotic" rather than medication name.  States he thought that RN CM was referring to regular medications that he takes daily.  Verbalized understanding that if lump is not improved followed medication that he should contact his MD for follow-up.   Issue resolved.    Mariann Laster, RN, BSN, [facility], CCM  [employer] Management Coordinator [phone] Office [phone] Direct [phone] Cell

## 2015-02-14 ENCOUNTER — Other Ambulatory Visit: Payer: Self-pay

## 2015-02-14 NOTE — Patient Outreach (Signed)
[facility]) Care Management  02/14/2015  DAETON KLUTH [birth year] [id number]   Care Coordination Note:  RN CM received update via in-basket from  Dr. Tawanna Sat NP: Chevis Pretty regarding the following:  1) Patient reports he only checks his BS when he feels like he needs to. States when he does check it is only once a day. (Primary Care confirms order for daily BS checks and patient knows that he should.   2) MD Notification: Patient reported no diabetic eye exam. (Primary Care confirms MD has advised patient on importance of scheduling this exam but non-adherent to recommendations.)  Plan:   RN CM will provide patient with supportive education to support MD's recommendations in healthcare management.   Mariann Laster, RN, BSN, [facility], CCM  [employer] Management Coordinator [phone] Direct [phone] Cell [phone] Office [phone] Fax

## 2015-03-13 ENCOUNTER — Other Ambulatory Visit: Payer: Medicare HMO

## 2015-03-13 NOTE — Patient Outreach (Signed)
[facility]) Care Management  03/13/2015  Frederick Foster Jan 05, 1951 [id number]   Telephonic Care Management Note  Outreach call #1 for monthly telephonic assessment and care coordination follow-up review.   Patient not reached.   RN CM left HIPAA compliant voice message with name and number.   RN CM will follow-up with next outreach call within one week.  Mariann Laster, RN, BSN, [facility], CCM  [employer] Management Coordinator [phone] Direct [phone] Cell [phone] Office [phone] Fax

## 2015-03-19 ENCOUNTER — Other Ambulatory Visit: Payer: Medicare HMO

## 2015-03-19 ENCOUNTER — Other Ambulatory Visit: Payer: Self-pay

## 2015-03-19 NOTE — Patient Outreach (Signed)
[place] [facility]) Care Management  03/19/2015  Frederick Foster January 16, 1951 [id number]   Inbound call received from [phone] cell number.  Contact states number does not belong to [employer].   Plan:   RN CM notified [place] Assistant to update patient's phone record to remove cell # [phone] from Epic.  Mariann Laster, RN, BSN, [facility], CCM  [employer] Management Coordinator [phone] Direct [phone] Cell [phone] Office [phone] Fax

## 2015-03-19 NOTE — Patient Outreach (Signed)
[facility]) Care Management  03/19/2015  CORTAVIUS MONTESINOS 03-19-1951 [id number]   Telephonic Care Management Note  Outreach call #2 for monthly telephonic assessment and care coordination follow-up review. Patient not reached.  [phone] home - left message [phone] cell - left message RN CM left HIPAA compliant voice message with name and number.  RN CM will follow-up with next outreach call within one week.  Mariann Laster, RN, BSN, [facility], CCM  [employer] Management Coordinator [phone] Direct [phone] Cell [phone] Office [phone] Fax

## 2015-03-21 ENCOUNTER — Other Ambulatory Visit: Payer: Medicare HMO

## 2015-03-21 NOTE — Patient Outreach (Signed)
[place] [facility]) Care Management  03/21/2015  KISHAWN PICKAR [birth year] [id number]  Telephonic Care Management Note  Outreach call #3 for monthly telephonic assessment and care coordination follow-up review.  Patient not reached at [phone] home. RN CM left HIPAA compliant voice message with name and number.  Plan: RN CM will send unsuccessful outreach letter to patient.  RN CM will review case in 2 weeks and close case if no response received back from patient.  Mariann Laster, RN, BSN, [facility] [place] Management Care Management Coordinator [phone] Direct [phone] Cell [phone] Office [phone] Fax  Mariann Laster, RN, BSN, [facility] [place] Management Care Management Coordinator [phone] Direct [phone] Cell [phone] Office [phone] Fax

## 2015-04-04 ENCOUNTER — Ambulatory Visit (INDEPENDENT_AMBULATORY_CARE_PROVIDER_SITE_OTHER): Payer: Commercial Managed Care - HMO | Admitting: Nurse Practitioner

## 2015-04-04 ENCOUNTER — Encounter: Payer: Self-pay | Admitting: Nurse Practitioner

## 2015-04-04 VITALS — BP 110/69 | HR 82 | Temp 97.1°F | Ht 70.0 in | Wt 215.0 lb

## 2015-04-04 DIAGNOSIS — F418 Other specified anxiety disorders: Secondary | ICD-10-CM | POA: Diagnosis not present

## 2015-04-04 DIAGNOSIS — E1159 Type 2 diabetes mellitus with other circulatory complications: Secondary | ICD-10-CM | POA: Diagnosis not present

## 2015-04-04 DIAGNOSIS — I10 Essential (primary) hypertension: Secondary | ICD-10-CM | POA: Diagnosis not present

## 2015-04-04 DIAGNOSIS — N183 Chronic kidney disease, stage 3 unspecified: Secondary | ICD-10-CM

## 2015-04-04 DIAGNOSIS — E1165 Type 2 diabetes mellitus with hyperglycemia: Secondary | ICD-10-CM | POA: Diagnosis not present

## 2015-04-04 DIAGNOSIS — E875 Hyperkalemia: Secondary | ICD-10-CM | POA: Diagnosis not present

## 2015-04-04 DIAGNOSIS — R0989 Other specified symptoms and signs involving the circulatory and respiratory systems: Secondary | ICD-10-CM

## 2015-04-04 DIAGNOSIS — I5032 Chronic diastolic (congestive) heart failure: Secondary | ICD-10-CM | POA: Diagnosis not present

## 2015-04-04 DIAGNOSIS — Z950 Presence of cardiac pacemaker: Secondary | ICD-10-CM | POA: Diagnosis not present

## 2015-04-04 DIAGNOSIS — I5033 Acute on chronic diastolic (congestive) heart failure: Secondary | ICD-10-CM | POA: Diagnosis not present

## 2015-04-04 DIAGNOSIS — E785 Hyperlipidemia, unspecified: Secondary | ICD-10-CM

## 2015-04-04 LAB — POCT GLYCOSYLATED HEMOGLOBIN (HGB A1C): Hemoglobin A1C: 11.8

## 2015-04-04 LAB — GLUCOSE, POCT (MANUAL RESULT ENTRY): POC Glucose: 213 mg/dl — AB (ref 70–99)

## 2015-04-04 MED ORDER — ALPRAZOLAM 1 MG PO TABS
1.0000 mg | ORAL_TABLET | Freq: Two times a day (BID) | ORAL | Status: DC | PRN
Start: 2015-04-04 — End: 2015-07-04

## 2015-04-04 MED ORDER — FUROSEMIDE 40 MG PO TABS: 120.0000 mg | ORAL_TABLET | Freq: Every day | ORAL | Status: DC

## 2015-04-04 MED ORDER — CARVEDILOL 3.125 MG PO TABS: 3.1250 mg | ORAL_TABLET | Freq: Two times a day (BID) | ORAL | Status: DC

## 2015-04-04 MED ORDER — ATORVASTATIN CALCIUM 40 MG PO TABS: 40.0000 mg | ORAL_TABLET | Freq: Every day | ORAL | Status: AC

## 2015-04-04 MED ORDER — INSULIN GLARGINE 300 UNIT/ML ~~LOC~~ SOPN: 65.0000 [IU] | PEN_INJECTOR | Freq: Every day | SUBCUTANEOUS | Status: DC

## 2015-04-04 MED ORDER — CITALOPRAM HYDROBROMIDE 20 MG PO TABS: 20.0000 mg | ORAL_TABLET | Freq: Every evening | ORAL | Status: DC

## 2015-04-04 NOTE — Patient Instructions (Signed)

## 2015-04-04 NOTE — Progress Notes (Signed)
Subjective:    Patient ID: Frederick Foster, male    DOB: 1951/04/06, 64 y.o.   MRN: [id number]  HPI Patient in today for follow up of chronic medical problems. He has been doing well since his last visit and has not had to go to the hospital- He has been in and out of the hospital several times over the last year. He multiple chronic medical problems.  Outpatient Encounter Prescriptions as of 04/04/2015  Medication Sig  . ALPRAZolam (XANAX) 1 MG tablet Take 1 tablet (1 mg total) by mouth 2 (two) times daily as needed for anxiety or sleep.  Marland Kitchen aspirin 81 MG tablet Take 81 mg by mouth daily.   Marland Kitchen atorvastatin (LIPITOR) 40 MG tablet Take 40 mg by mouth daily.  . carvedilol (COREG) 3.125 MG tablet Take 1 tablet (3.125 mg total) by mouth 2 (two) times daily with a meal.  . citalopram (CELEXA) 20 MG tablet Take 1 tablet (20 mg total) by mouth every evening.  . fluticasone (FLONASE) 50 MCG/ACT nasal spray Place 2 sprays into both nostrils daily.  . furosemide (LASIX) 40 MG tablet Take 3 tablets by mouth daily.  Marland Kitchen glucose blood (ONETOUCH VERIO) test strip Check blood sugar 1x per day E11.9  . HYDROcodone-acetaminophen (NORCO/VICODIN) 5-325 MG per tablet Take 1 tablet by mouth every 6 (six) hours as needed for moderate pain.  . Insulin Glargine (TOUJEO SOLOSTAR) 300 UNIT/ML SOPN Inject 65 Units into the skin daily. BID (Patient taking differently: Inject 40 Units into the skin daily. )  . nitroGLYCERIN (NITROSTAT) 0.4 MG SL tablet Place 0.4 mg under the tongue.  Marland Kitchen oxyCODONE (OXY IR/ROXICODONE) 5 MG immediate release tablet   . [DISCONTINUED] doxycycline (VIBRA-TABS) 100 MG tablet Take 1 tablet (100 mg total) by mouth 2 (two) times daily. 1 po bid (Patient not taking: Reported on 02/12/2015)   Facility-Administered Encounter Medications as of 04/04/2015  Medication  . 0.9 %  sodium chloride infusion  . benzocaine (HURRICAINE) 20 % mouth spray 1 application  . fentaNYL (SUBLIMAZE) injection 250 mcg  .  midazolam (VERSED) injection 10 mg   Patient Active Problem List   Diagnosis Date Noted  . Sick sinus syndrome 05/21/2013  . Cardiac pacemaker in situ 04/07/2013  . Hyperkalemia 04/07/2013  . Bladder cancer 02/23/2013  . Arteriosclerosis of coronary artery 01/15/2013  . Depression with anxiety 10/23/2012  . Chronic diastolic CHF (congestive heart failure) 04/26/2012  . CKD (chronic kidney disease), stage III 04/26/2012  . CHB (complete heart block) 04/25/2012  . Acute on chronic diastolic heart failure 11/65/7903  . Atrial flutter 11/19/2011  . Lymphoma 06/22/2011  . Diabetes 05/17/2011  . Solitary kidney 05/16/2011  . Hyponatremia 05/16/2011  . HTN (hypertension) 05/13/2011  . Hyperlipidemia with target LDL less than 100 10/22/2008  . HERNIA, VENTRAL 10/22/2008  . Carotid bruit 10/22/2008  . CORONARY ARTERY BYPASS GRAFT, TWO VESSEL, HX OF 10/22/2008     Review of Systems  Constitutional: Positive for fatigue.  HENT: Negative.   Respiratory: Positive for shortness of breath (no more then usual).   Cardiovascular: Negative.   Gastrointestinal: Negative.   Genitourinary: Negative.   Neurological: Negative.   Psychiatric/Behavioral: Negative.   All other systems reviewed and are negative.      Objective:   Physical Exam  Constitutional: He is oriented to person, place, and time. He appears well-developed and well-nourished.  HENT:  Head: Normocephalic.  Right Ear: External ear normal.  Left Ear: External ear normal.  Nose:  Nose normal.  Mouth/Throat: Oropharynx is clear and moist.  Eyes: EOM are normal. Pupils are equal, round, and reactive to light.  Neck: Normal range of motion. Neck supple. No JVD present. No thyromegaly present.  Cardiovascular: Normal rate, regular rhythm, normal heart sounds and intact distal pulses.  Exam reveals no gallop and no friction rub.   No murmur heard. Pulmonary/Chest: Effort normal and breath sounds normal. No respiratory distress.  He has no wheezes. He has no rales. He exhibits no tenderness.  Abdominal: Soft. Bowel sounds are normal. He exhibits no mass. There is no tenderness.  ilieal conduit stump - normal- no signs of infection.  Musculoskeletal: Normal range of motion. He exhibits no edema.  Lymphadenopathy:    He has no cervical adenopathy.  Neurological: He is alert and oriented to person, place, and time. No cranial nerve deficit.  Skin: Skin is warm and dry.  Psychiatric: He has a normal mood and affect. His behavior is normal. Judgment and thought content normal.    BP 110/69 mmHg  Pulse 82  Temp(Src) 97.1 F (36.2 C) (Oral)  Ht 5\' 10"  (1.778 m)  Wt 215 lb (97.523 kg)  BMI 30.85 kg/m2       Assessment & Plan:   1. Hyperlipidemia with target LDL less than 100   2. Essential hypertension   3. Type 2 diabetes mellitus with other circulatory complications   4. Acute on chronic diastolic heart failure   5. Chronic diastolic CHF (congestive heart failure)   6. CKD (chronic kidney disease), stage III   7. Depression with anxiety   8. Bilateral carotid bruits   9. Cardiac pacemaker in situ   10. Hyperkalemia   11. Type 2 diabetes mellitus with hyperglycemia    Labs pending Continue diet and exercise Continue all meds Follow up in 3 months  [place], FNP

## 2015-04-04 NOTE — Patient Outreach (Signed)
[place] [facility]) Care Management  04/04/2015  LAURO MANLOVE Mar 17, 1951 [id number]   Case Closure Note  Case closed due to no response to phone attempts or unsuccessful outreach letter.   Plan:   RN CM notified [place] of case closure:  Patient has withdrawn from program.  Patient agreed to participate but has not responded to follow-up call attempts or unsuccessful outreach letter.  RN CM notified Primary MD via case closure letter.   Mariann Laster, RN, BSN, [facility], CCM  [facility] [employer] Management Coordinator [phone] Direct [phone] Cell [phone] Office [phone] Fax

## 2015-04-05 LAB — CMP14+EGFR
ALBUMIN: 3.1 g/dL — AB (ref 3.6–4.8)
ALK PHOS: 88 IU/L (ref 39–117)
ALT: 8 IU/L (ref 0–44)
AST: 7 IU/L (ref 0–40)
Albumin/Globulin Ratio: 1 — ABNORMAL LOW (ref 1.1–2.5)
BILIRUBIN TOTAL: 0.4 mg/dL (ref 0.0–1.2)
BUN/Creatinine Ratio: 24 — ABNORMAL HIGH (ref 10–22)
BUN: 38 mg/dL — AB (ref 8–27)
CHLORIDE: 96 mmol/L — AB (ref 97–108)
CO2: 23 mmol/L (ref 18–29)
Calcium: 8.5 mg/dL — ABNORMAL LOW (ref 8.6–10.2)
Creatinine, Ser: 1.61 mg/dL — ABNORMAL HIGH (ref 0.76–1.27)
GFR calc Af Amer: 51 mL/min/{1.73_m2} — ABNORMAL LOW (ref >59)
GFR calc non Af Amer: 44 mL/min/{1.73_m2} — ABNORMAL LOW (ref >59)
GLUCOSE: 208 mg/dL — AB (ref 65–99)
Globulin, Total: 3 g/dL (ref 1.5–4.5)
Potassium: 4.7 mmol/L (ref 3.5–5.2)
Sodium: 135 mmol/L (ref 134–144)
Total Protein: 6.1 g/dL (ref 6.0–8.5)

## 2015-04-05 LAB — LIPID PANEL
CHOLESTEROL TOTAL: 138 mg/dL (ref 100–199)
Chol/HDL Ratio: 3.8 ratio units (ref 0.0–5.0)
HDL: 36 mg/dL — ABNORMAL LOW (ref >39)
LDL Calculated: 81 mg/dL (ref 0–99)
TRIGLYCERIDES: 105 mg/dL (ref 0–149)
VLDL CHOLESTEROL CAL: 21 mg/dL (ref 5–40)

## 2015-04-07 ENCOUNTER — Other Ambulatory Visit: Payer: Self-pay | Admitting: Nurse Practitioner

## 2015-04-07 MED ORDER — SODIUM BICARBONATE 650 MG PO TABS: 650.0000 mg | ORAL_TABLET | Freq: Two times a day (BID) | ORAL | Status: DC

## 2015-04-07 NOTE — Telephone Encounter (Signed)
done

## 2015-04-09 NOTE — Patient Outreach (Signed)
[place] [facility]) Care Management  04/09/2015  Frederick Foster October 19, 1950 [id number]   Notification from Mariann Laster, RN to close case due to unable to contact patient for [place] Management services.  Thanks, [name]. [place], [place] Assistant Phone: [phone] Fax: [phone]

## 2015-04-15 ENCOUNTER — Ambulatory Visit (INDEPENDENT_AMBULATORY_CARE_PROVIDER_SITE_OTHER): Payer: Commercial Managed Care - HMO

## 2015-04-15 DIAGNOSIS — Z23 Encounter for immunization: Secondary | ICD-10-CM

## 2015-04-30 ENCOUNTER — Other Ambulatory Visit: Payer: Self-pay | Admitting: *Deleted

## 2015-04-30 MED ORDER — INSULIN GLARGINE 300 UNIT/ML ~~LOC~~ SOPN: 65.0000 [IU] | PEN_INJECTOR | Freq: Every day | SUBCUTANEOUS | Status: DC

## 2015-05-05 ENCOUNTER — Telehealth: Payer: Self-pay | Admitting: Nurse Practitioner

## 2015-05-06 MED ORDER — SODIUM BICARBONATE 650 MG PO TABS: 650.0000 mg | ORAL_TABLET | Freq: Two times a day (BID) | ORAL | Status: AC

## 2015-05-06 NOTE — Telephone Encounter (Signed)
rx sent to pharmacy

## 2015-05-20 NOTE — Patient Outreach (Signed)
[place]) Care Management  05/20/2015  Frederick Foster [birth year] [id number]   Referral from [facility] tier 4 list, assigned Sherrin Daisy, RN for patient outreach.  Garold Sheeler L. Dilynn Munroe, [place] Care Management Assistant

## 2015-05-26 ENCOUNTER — Other Ambulatory Visit: Payer: Self-pay | Admitting: *Deleted

## 2015-05-27 ENCOUNTER — Other Ambulatory Visit: Payer: Medicare HMO | Admitting: *Deleted

## 2015-05-27 NOTE — Patient Outreach (Signed)
[place] [facility]) Care Management  05/27/2015  Frederick Foster 1951-01-23 [id number]   Telephone call to patient; left message on voice mail. Call made to alternate number and was advised wrong number.  Plan: will follow up.  Sherrin Daisy, RN BSN [place] Management Coordinator [facility] Care Management  [phone]

## 2015-05-27 NOTE — Patient Outreach (Signed)
[place] [facility]) Care Management  05/27/2015  Frederick Foster 05-03-1951 [id number]    [facility] HMO Tier 4 list referral: Telephone call made to patient 05/26/15; left message on voice mail requesting return call.  Plan: will follow up. Sherrin Daisy, RN BSN [place] Management Coordinator [facility] Care Management  [phone]

## 2015-06-02 ENCOUNTER — Other Ambulatory Visit: Payer: Medicare HMO | Admitting: *Deleted

## 2015-06-02 ENCOUNTER — Encounter: Payer: Self-pay | Admitting: *Deleted

## 2015-06-02 NOTE — Patient Outreach (Signed)
[place] [facility]) Care Management  06/02/2015  Frederick Foster 08-21-1950 [id number]   Telephone call attempt x 3 ; left HIPPA compliant voice mail requesting return call.   Plan: Send outreach letter; follow up in 10 business days. Sherrin Daisy, RN BSN [place] Management Coordinator [facility] Care Management  [phone]

## 2015-06-16 ENCOUNTER — Telehealth: Payer: Self-pay | Admitting: Nurse Practitioner

## 2015-06-16 ENCOUNTER — Other Ambulatory Visit: Payer: Self-pay | Admitting: *Deleted

## 2015-06-16 MED ORDER — INSULIN GLARGINE 300 UNIT/ML ~~LOC~~ SOPN: 65.0000 [IU] | PEN_INJECTOR | Freq: Every day | SUBCUTANEOUS | Status: DC

## 2015-06-16 NOTE — Patient Outreach (Signed)
[place] [facility]) Care Management  06/16/2015  Frederick Foster 26-Jul-1950 [id number]  No response from patient after 3 call attempts and outreach letter.  Plan:  Send MD closure letter. Send to care management assistant to close.  Sherrin Daisy, RN BSN [place] Management Coordinator [facility] Care Management  [phone]

## 2015-06-16 NOTE — Telephone Encounter (Signed)
Samples put in fridge for pt and pt aware samples ready.

## 2015-06-18 ENCOUNTER — Encounter: Payer: Self-pay | Admitting: *Deleted

## 2015-06-25 ENCOUNTER — Encounter (HOSPITAL_COMMUNITY): Payer: Self-pay | Admitting: Emergency Medicine

## 2015-06-25 ENCOUNTER — Emergency Department (HOSPITAL_COMMUNITY): Payer: Commercial Managed Care - HMO

## 2015-06-25 ENCOUNTER — Observation Stay (HOSPITAL_COMMUNITY)
Admission: EM | Admit: 2015-06-25 | Discharge: 2015-06-27 | Disposition: A | Discharge status: Home / Self Care | Payer: Commercial Managed Care - HMO | Attending: Internal Medicine | Admitting: Internal Medicine

## 2015-06-25 DIAGNOSIS — K439 Ventral hernia without obstruction or gangrene: Secondary | ICD-10-CM | POA: Diagnosis not present

## 2015-06-25 DIAGNOSIS — F419 Anxiety disorder, unspecified: Secondary | ICD-10-CM | POA: Insufficient documentation

## 2015-06-25 DIAGNOSIS — Z7982 Long term (current) use of aspirin: Secondary | ICD-10-CM | POA: Insufficient documentation

## 2015-06-25 DIAGNOSIS — Z79899 Other long term (current) drug therapy: Secondary | ICD-10-CM | POA: Insufficient documentation

## 2015-06-25 DIAGNOSIS — R7989 Other specified abnormal findings of blood chemistry: Secondary | ICD-10-CM | POA: Diagnosis present

## 2015-06-25 DIAGNOSIS — Z8546 Personal history of malignant neoplasm of prostate: Secondary | ICD-10-CM | POA: Insufficient documentation

## 2015-06-25 DIAGNOSIS — Z8572 Personal history of non-Hodgkin lymphomas: Secondary | ICD-10-CM | POA: Diagnosis not present

## 2015-06-25 DIAGNOSIS — I4891 Unspecified atrial fibrillation: Secondary | ICD-10-CM | POA: Insufficient documentation

## 2015-06-25 DIAGNOSIS — N183 Chronic kidney disease, stage 3 unspecified: Secondary | ICD-10-CM | POA: Diagnosis present

## 2015-06-25 DIAGNOSIS — R778 Other specified abnormalities of plasma proteins: Secondary | ICD-10-CM | POA: Diagnosis present

## 2015-06-25 DIAGNOSIS — Z87891 Personal history of nicotine dependence: Secondary | ICD-10-CM | POA: Diagnosis not present

## 2015-06-25 DIAGNOSIS — Z880 Allergy status to penicillin: Secondary | ICD-10-CM | POA: Diagnosis not present

## 2015-06-25 DIAGNOSIS — Z794 Long term (current) use of insulin: Secondary | ICD-10-CM | POA: Diagnosis not present

## 2015-06-25 DIAGNOSIS — I10 Essential (primary) hypertension: Secondary | ICD-10-CM

## 2015-06-25 DIAGNOSIS — I509 Heart failure, unspecified: Secondary | ICD-10-CM | POA: Diagnosis not present

## 2015-06-25 DIAGNOSIS — N179 Acute kidney failure, unspecified: Secondary | ICD-10-CM | POA: Insufficient documentation

## 2015-06-25 DIAGNOSIS — R079 Chest pain, unspecified: Secondary | ICD-10-CM | POA: Diagnosis not present

## 2015-06-25 DIAGNOSIS — F418 Other specified anxiety disorders: Secondary | ICD-10-CM | POA: Diagnosis present

## 2015-06-25 DIAGNOSIS — E785 Hyperlipidemia, unspecified: Secondary | ICD-10-CM | POA: Diagnosis present

## 2015-06-25 DIAGNOSIS — F329 Major depressive disorder, single episode, unspecified: Secondary | ICD-10-CM | POA: Diagnosis not present

## 2015-06-25 DIAGNOSIS — E1159 Type 2 diabetes mellitus with other circulatory complications: Secondary | ICD-10-CM

## 2015-06-25 DIAGNOSIS — I2510 Atherosclerotic heart disease of native coronary artery without angina pectoris: Secondary | ICD-10-CM | POA: Diagnosis not present

## 2015-06-25 DIAGNOSIS — E782 Mixed hyperlipidemia: Secondary | ICD-10-CM | POA: Diagnosis not present

## 2015-06-25 DIAGNOSIS — Z85528 Personal history of other malignant neoplasm of kidney: Secondary | ICD-10-CM

## 2015-06-25 DIAGNOSIS — E1129 Type 2 diabetes mellitus with other diabetic kidney complication: Secondary | ICD-10-CM | POA: Diagnosis present

## 2015-06-25 DIAGNOSIS — I5032 Chronic diastolic (congestive) heart failure: Secondary | ICD-10-CM | POA: Diagnosis present

## 2015-06-25 DIAGNOSIS — Z950 Presence of cardiac pacemaker: Secondary | ICD-10-CM | POA: Diagnosis present

## 2015-06-25 DIAGNOSIS — Z955 Presence of coronary angioplasty implant and graft: Secondary | ICD-10-CM

## 2015-06-25 DIAGNOSIS — Z8551 Personal history of malignant neoplasm of bladder: Secondary | ICD-10-CM | POA: Diagnosis not present

## 2015-06-25 DIAGNOSIS — Z951 Presence of aortocoronary bypass graft: Secondary | ICD-10-CM

## 2015-06-25 HISTORY — DX: Disorder of kidney and ureter, unspecified: N28.9

## 2015-06-25 HISTORY — DX: Heart failure, unspecified: I50.9

## 2015-06-25 LAB — TROPONIN I
TROPONIN I: 0.04 ng/mL — AB (ref <0.031)
TROPONIN I: 0.05 ng/mL — AB (ref <0.031)

## 2015-06-25 LAB — BASIC METABOLIC PANEL
ANION GAP: 9 (ref 5–15)
BUN: 52 mg/dL — ABNORMAL HIGH (ref 6–20)
CALCIUM: 8.6 mg/dL — AB (ref 8.9–10.3)
CO2: 24 mmol/L (ref 22–32)
Chloride: 97 mmol/L — ABNORMAL LOW (ref 101–111)
Creatinine, Ser: 1.75 mg/dL — ABNORMAL HIGH (ref 0.61–1.24)
GFR calc non Af Amer: 39 mL/min — ABNORMAL LOW (ref >60)
GFR, EST AFRICAN AMERICAN: 45 mL/min — AB (ref >60)
GLUCOSE: 269 mg/dL — AB (ref 65–99)
POTASSIUM: 4.4 mmol/L (ref 3.5–5.1)
Sodium: 130 mmol/L — ABNORMAL LOW (ref 135–145)

## 2015-06-25 LAB — CBC
HEMATOCRIT: 31.6 % — AB (ref 39.0–52.0)
HEMOGLOBIN: 10.6 g/dL — AB (ref 13.0–17.0)
MCH: 30.8 pg (ref 26.0–34.0)
MCHC: 33.5 g/dL (ref 30.0–36.0)
MCV: 91.9 fL (ref 78.0–100.0)
Platelets: 225 10*3/uL (ref 150–400)
RBC: 3.44 MIL/uL — AB (ref 4.22–5.81)
RDW: 14.2 % (ref 11.5–15.5)
WBC: 6.4 10*3/uL (ref 4.0–10.5)

## 2015-06-25 LAB — MRSA PCR SCREENING: MRSA BY PCR: NEGATIVE

## 2015-06-25 LAB — GLUCOSE, CAPILLARY: Glucose-Capillary: 200 mg/dL — ABNORMAL HIGH (ref 65–99)

## 2015-06-25 MED ORDER — SODIUM CHLORIDE 0.9 % IJ SOLN
3.0000 mL | Freq: Two times a day (BID) | INTRAMUSCULAR | Status: DC
  Administered 2015-06-26: 3 mL via INTRAVENOUS

## 2015-06-25 MED ORDER — SODIUM BICARBONATE 650 MG PO TABS
650.0000 mg | ORAL_TABLET | Freq: Two times a day (BID) | ORAL | Status: DC
  Administered 2015-06-25 – 2015-06-26 (×3): 650 mg via ORAL
  Filled 2015-06-25 (×3): qty 1

## 2015-06-25 MED ORDER — ALPRAZOLAM 1 MG PO TABS
1.0000 mg | ORAL_TABLET | Freq: Two times a day (BID) | ORAL | Status: DC | PRN
  Administered 2015-06-25 – 2015-06-26 (×2): 1 mg via ORAL
  Filled 2015-06-25 (×2): qty 1

## 2015-06-25 MED ORDER — INSULIN ASPART 100 UNIT/ML ~~LOC~~ SOLN
0.0000 [IU] | Freq: Three times a day (TID) | SUBCUTANEOUS | Status: DC
  Administered 2015-06-26: 1 [IU] via SUBCUTANEOUS

## 2015-06-25 MED ORDER — INSULIN ASPART 100 UNIT/ML ~~LOC~~ SOLN: 0.0000 [IU] | Freq: Every day | SUBCUTANEOUS | Status: DC

## 2015-06-25 MED ORDER — ONDANSETRON HCL 4 MG PO TABS: 4.0000 mg | ORAL_TABLET | Freq: Four times a day (QID) | ORAL | Status: DC | PRN

## 2015-06-25 MED ORDER — INSULIN GLARGINE 100 UNIT/ML ~~LOC~~ SOLN
65.0000 [IU] | Freq: Every day | SUBCUTANEOUS | Status: DC
  Administered 2015-06-26: 65 [IU] via SUBCUTANEOUS
  Filled 2015-06-25 (×2): qty 0.65

## 2015-06-25 MED ORDER — FUROSEMIDE 80 MG PO TABS
120.0000 mg | ORAL_TABLET | Freq: Every day | ORAL | Status: DC
  Administered 2015-06-26: 120 mg via ORAL
  Filled 2015-06-25: qty 1

## 2015-06-25 MED ORDER — ATORVASTATIN CALCIUM 40 MG PO TABS
40.0000 mg | ORAL_TABLET | Freq: Every day | ORAL | Status: DC
  Administered 2015-06-26: 40 mg via ORAL
  Filled 2015-06-25: qty 1

## 2015-06-25 MED ORDER — ASPIRIN 81 MG PO CHEW
81.0000 mg | CHEWABLE_TABLET | Freq: Every day | ORAL | Status: DC
  Administered 2015-06-26: 81 mg via ORAL
  Filled 2015-06-25: qty 1

## 2015-06-25 MED ORDER — ENOXAPARIN SODIUM 40 MG/0.4ML ~~LOC~~ SOLN
40.0000 mg | SUBCUTANEOUS | Status: DC
  Administered 2015-06-25 – 2015-06-26 (×2): 40 mg via SUBCUTANEOUS
  Filled 2015-06-25 (×2): qty 0.4

## 2015-06-25 MED ORDER — MORPHINE SULFATE (PF) 4 MG/ML IV SOLN
4.0000 mg | INTRAVENOUS | Status: DC | PRN
  Administered 2015-06-25: 4 mg via INTRAVENOUS
  Filled 2015-06-25: qty 1

## 2015-06-25 MED ORDER — ASPIRIN 81 MG PO CHEW
324.0000 mg | CHEWABLE_TABLET | Freq: Once | ORAL | Status: AC
  Administered 2015-06-25: 324 mg via ORAL
  Filled 2015-06-25: qty 4

## 2015-06-25 MED ORDER — NITROGLYCERIN 0.4 MG SL SUBL
0.4000 mg | SUBLINGUAL_TABLET | SUBLINGUAL | Status: DC | PRN
  Administered 2015-06-25 (×2): 0.4 mg via SUBLINGUAL
  Filled 2015-06-25: qty 1

## 2015-06-25 MED ORDER — ENOXAPARIN SODIUM 30 MG/0.3ML ~~LOC~~ SOLN: 30.0000 mg | SUBCUTANEOUS | Status: DC

## 2015-06-25 MED ORDER — FLUTICASONE PROPIONATE 50 MCG/ACT NA SUSP: 2.0000 | Freq: Every day | NASAL | Status: DC

## 2015-06-25 MED ORDER — CITALOPRAM HYDROBROMIDE 20 MG PO TABS
20.0000 mg | ORAL_TABLET | Freq: Every evening | ORAL | Status: DC
  Administered 2015-06-25 – 2015-06-26 (×2): 20 mg via ORAL
  Filled 2015-06-25 (×2): qty 1

## 2015-06-25 MED ORDER — ONDANSETRON HCL 4 MG/2ML IJ SOLN
4.0000 mg | Freq: Four times a day (QID) | INTRAMUSCULAR | Status: DC | PRN
Start: 2015-06-25 — End: 2015-06-27

## 2015-06-25 MED ORDER — FUROSEMIDE 80 MG PO TABS: 120.0000 mg | ORAL_TABLET | Freq: Every day | ORAL | Status: DC

## 2015-06-25 MED ORDER — MORPHINE SULFATE (PF) 4 MG/ML IV SOLN
4.0000 mg | INTRAVENOUS | Status: DC | PRN
  Administered 2015-06-25 – 2015-06-26 (×4): 4 mg via INTRAVENOUS
  Filled 2015-06-25 (×4): qty 1

## 2015-06-25 NOTE — ED Notes (Signed)
Admission bed assigned at this time.

## 2015-06-25 NOTE — H&P (Signed)
[facility] History and Physical  Frederick Foster MEQ:[id number] DOB: 1951/06/02 DOA: 06/25/2015  Referring physician: ER PCP: Chevis Pretty, FNP   Chief Complaint: Chest pain  HPI: Frederick Foster is a 64 y.o. 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 He describes it as dull in nature and seems to radiate to his back. However, earlier he had given the history that it seems to radiate to the right side of his chest to the ER physician. Approximately 3 weeks ago he had been lifting a heavy door. He denies any chest pain after this had started to get the pain 4 days ago. He told the emergency room physician that the pain is worse with exertion and last approximately 20 minutes and improves with rest. However, he tells me that he has had it all night long and into today and this is why he came to the emergency room. He says that the pain sometimes makes him sweat and nauseous. It is not associated with dyspnea. He is now being admitted for further investigation.   Review of Systems:  Apart from symptoms above, all systems are negative.  Past Medical History  Diagnosis Date  . Mixed hyperlipidemia   . 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 HERNIA, VENTRAL   . CAROTID BRUIT   . Coronary artery disease     h/o MI and bypass surgery x 3 in 2979 complicated by sternal wound infection, sees Dr. Johnsie Cancel  . Vein symptom     injury to left leg due to conveyor belt.  caused injury to veins in left leg/ s/p surgery   . Sinus congestion     has allergies  . AODM   . Hiatal hernia     s/p hiatal hernia surgery 2010  . Anxiety     history of panic attacks  . Depression     takes celexa  . Atrial flutter ([place]) 11/19/2011   . Atrial fibrillation ([place])   . Open abdominal wall wound   . Cancer ([place])      kidney ca, also mass to left neck  . Lymphoma ([place])   . Bladder cancer ([place])   . Prostate cancer ([place])   . Non Hodgkin's lymphoma ([place])   . CKD (chronic kidney disease), stage III     has one kidney.  had left kidney removed in 2011 for cancer  . Chronic kidney disease, stage III (moderate)   . Acute renal failure ([place]) 04/06/2013    With hyperkalemia and met acidosis   Past Surgical History  Procedure Laterality Date  . Cholecystectomy    . Kidney surgery    . Hernia repair      2010  . Coronary artery bypass graft      x3 .  Dr. Florence Canner is present cardiologist  . Vein surgery      s/p injury to left leg from conveyor belt  . Cardiac catheterization      2009  . Portacath placement    . Tee without cardioversion  01/07/2012    Procedure: TRANSESOPHAGEAL ECHOCARDIOGRAM (TEE);  Surgeon: Fay Records, MD;  Location: [facility] ENDOSCOPY;  Service: Cardiovascular;  Laterality: N/A;  . Atrial flutter ablation  01/07/12    CTI  ablation by Dr Rayann Heman  . Bladder surgery    . Prostate surgery    . Temporary pacemaker insertion    . Atrial flutter ablation N/A 01/07/2012    Procedure: ATRIAL FLUTTER ABLATION;  Surgeon: Thompson Grayer, MD;  Location: [facility] CATH LAB;  Service: Cardiovascular;  Laterality: N/A;  . Bladder surgery     Social History:  reports that he quit smoking about 5 years ago. His smoking use included Cigarettes. He has a 40 pack-year smoking history. He has never used smokeless tobacco. He reports that he does not drink alcohol or use illicit drugs.  Allergies  Allergen Reactions  . Penicillins Anaphylaxis    Has patient had a PCN reaction causing immediate rash, facial/tongue/throat swelling, SOB or lightheadedness with hypotension: No Has patient had a PCN reaction causing severe rash involving mucus membranes or skin necrosis: No Has patient had a PCN reaction that required hospitalization  Yes Has patient had a PCN reaction occurring within the last 10 years: No If all of the above answers are "NO", then may proceed with Cephalosporin use.   . Other Other (See Comments)    "I'm allergic to a certain type of sutures."  . Sulfonamide Derivatives Rash    Family History  Problem Relation Age of Onset  . Heart disease Mother     Prior to Admission medications   Medication Sig Start Date End Date Taking? Authorizing Provider  ALPRAZolam Duanne Moron) 1 MG tablet Take 1 tablet (1 mg total) by mouth 2 (two) times daily as needed for anxiety or sleep. 04/04/15  Yes Mary-Margaret Hassell Done, FNP  aspirin 81 MG tablet Take 81 mg by mouth daily.  11/11/13  Yes Historical Provider, MD  atorvastatin (LIPITOR) 40 MG tablet Take 1 tablet (40 mg total) by mouth daily. 04/04/15  Yes Mary-Margaret Hassell Done, FNP  citalopram (CELEXA) 20 MG tablet Take 1 tablet (20 mg total) by mouth every evening. 04/04/15  Yes Mary-Margaret Hassell Done, FNP  fluticasone (FLONASE) 50 MCG/ACT nasal spray Place 2 sprays into both nostrils daily. Patient taking differently: Place 2 sprays into both nostrils daily as needed for allergies.  01/03/15  Yes Tiffany A Gann, PA-C  furosemide (LASIX) 40 MG tablet Take 3 tablets (120 mg total) by mouth daily. Patient taking differently: Take 80 mg by mouth daily.  04/04/15  Yes Mary-Margaret Hassell Done, FNP  Insulin Glargine (TOUJEO SOLOSTAR) 300 UNIT/ML SOPN Inject 65 Units into the skin daily. BID Patient taking differently: Inject 60 Units into the skin daily. BID 06/16/15  Yes Mary-Margaret Hassell Done, FNP  nitroGLYCERIN (NITROSTAT) 0.4 MG SL tablet Place 0.4 mg under the tongue. 09/06/14  Yes Historical Provider, MD  sodium bicarbonate 650 MG tablet Take 1 tablet (650 mg total) by mouth 2 (two) times daily. Take 2 tabs twice dialy Patient taking differently: Take 1,300 mg by mouth 2 (two) times daily. Take 2 tabs twice dialy 05/06/15  Yes Mary-Margaret Hassell Done, FNP  glucose blood [facility] VERIO) test  strip Check blood sugar 1x per day E11.9 12/13/14   Mary-Margaret Hassell Done, FNP   Physical Exam: Filed Vitals:   06/25/15 1630 06/25/15 1700 06/25/15 1715 06/25/15 1730  BP: 114/60 119/61  116/75  Pulse: 89 80 77 82  Temp:      TempSrc:      Resp: 18 14 11 18   Height:      Weight:      SpO2: 100% 99% 97% 98%    Wt Readings from Last 3 Encounters:  06/25/15 99.791 kg (220 lb)  04/04/15 97.523 kg (215 lb)  02/12/15 99.791 kg (220 lb)    General:  Appears calm and comfortable Eyes: PERRL, normal lids, irises & conjunctiva ENT: grossly normal hearing, lips & tongue Neck: no LAD, masses or thyromegaly Cardiovascular: RRR, no m/r/g. No LE edema. Telemetry: Paced rhythm. Respiratory: CTA bilaterally, no w/r/r. Normal respiratory effort. Abdomen: soft, ntnd Skin: no rash or induration seen on limited exam Musculoskeletal: grossly normal tone BUE/BLE Psychiatric: grossly normal mood and affect, speech fluent and appropriate Neurologic: grossly non-focal.          Labs on Admission:  Basic Metabolic Panel:  Recent Labs Lab 06/25/15 1428  NA 130*  K 4.4  CL 97*  CO2 24  GLUCOSE 269*  BUN 52*  CREATININE 1.75*  CALCIUM 8.6*   Liver Function Tests: No results for input(s): AST, ALT, ALKPHOS, BILITOT, PROT, ALBUMIN in the last 168 hours. No results for input(s): LIPASE, AMYLASE in the last 168 hours. No results for input(s): AMMONIA in the last 168 hours. CBC:  Recent Labs Lab 06/25/15 1428  WBC 6.4  HGB 10.6*  HCT 31.6*  MCV 91.9  PLT 225   Cardiac Enzymes:  Recent Labs Lab 06/25/15 1428  TROPONINI 0.04*    BNP (last 3 results)  Recent Labs  12/20/14 1415  BNP 1763.0*    ProBNP (last 3 results) No results for input(s): PROBNP in the last 8760 hours.  CBG: No results for input(s): GLUCAP in the last 168 hours.  Radiological Exams on Admission: Dg Chest 2 View  06/25/2015  CLINICAL DATA:  Mid chest pain radiating to the right for 4 days. Former  smoker. Cough. EXAM: CHEST  2 VIEW COMPARISON:  12/20/2014; chest CT - 12/20/2014 FINDINGS: Grossly unchanged cardiac silhouette and mediastinal contours post median sternotomy and CABG. Interval removal of left jugular approach dialysis catheter. Otherwise, stable position of remaining support apparatus. The lungs remain hyperexpanded. Resolved small left-sided effusion and associated bibasilar opacities. No focal airspace opacities. No evidence of edema or pneumothorax. No acute osseus abnormalities. IMPRESSION: 1. Hyperexpanded lungs without acute cardiopulmonary disease. 2. Resolved small left-sided effusion and associated left basilar atelectasis. Electronically Signed   By: Sandi Mariscal M.D.   On: 06/25/2015 14:56    EKG: Independently reviewed. Paced rhythm.  Assessment/Plan   1. Chest pain. The etiology is not clear to me at all. The history seems to vary also. Obtain serial troponin levels. Cardiology consultation the morning. 2. Diabetes. Continue with home medications and sliding scale of insulin. 3. Hypertension. Stable. Continue home medications. 4. Chronic kidney disease. Stable at baseline.  He'll be admitted to telemetry under observation. Further recommendations will depend on patient's hospital progress.  Code Status: Full code.  DVT Prophylaxis: Lovenox.  Family Communication: I discussed the plan with the patient at the bedside.   Disposition Plan: Home when medically stable  Time spent: 45 minutes.  Doree Albee [facility] Pager [phone].

## 2015-06-25 NOTE — ED Notes (Signed)
Pt states that he has been having pain off and on since before Thanksgiving, however pain has become worst last few days - At time pain bad enough to make him feel nauseated -- denies this feeling at this time

## 2015-06-25 NOTE — ED Provider Notes (Signed)
CSN: [id number]     Arrival date & time 06/25/15  1404 History   First MD Initiated Contact with Patient 06/25/15 1416     Chief Complaint  Patient presents with  . Chest Pain     HPI  Pt was seen at 1435. Per pt, c/o gradual onset and worsening of persistent chest "pain" for the past 3 weeks, worse since last night. Pt describes the CP as "dull," located mid-sternal chest area with radiation into the right side of his chest. Has been associated with nausea. Symptoms worsen with exertion (ie: lifting, walking), lasts approximately 73mn, and improves with resting. Pt took oxycodone without improvement. Denies palpitations, no SOB/cough, no back pain, no abd pain, no vomiting/diarrhea, no fevers.     Past Medical History  Diagnosis Date  . Mixed hyperlipidemia   . 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 HERNIA, VENTRAL   . CAROTID BRUIT   . Coronary artery disease     h/o MI and bypass surgery x 3 in 26433complicated by sternal wound infection, sees Dr. NJohnsie Cancel . Vein symptom     injury to left leg due to conveyor belt.  caused injury to veins in left leg/ s/p surgery   . Sinus congestion     has allergies  . AODM   . Hiatal hernia     s/p hiatal hernia surgery 2010  . Anxiety     history of panic attacks  . Depression     takes celexa  . Atrial flutter ([place] 11/19/2011  . Atrial fibrillation ([place]   . Open abdominal wall wound   . Cancer ([place]      kidney ca, also mass to left neck  . Lymphoma ([place]   . Bladder cancer ([place]   . Prostate cancer ([place]   . Non Hodgkin's lymphoma ([place]   . CKD (chronic kidney disease), stage III     has one kidney.  had left kidney removed in 2011 for cancer  . Chronic kidney disease, stage III (moderate)   . Acute renal failure ([place] 04/06/2013    With hyperkalemia and met acidosis   Past Surgical History  Procedure Laterality Date  . Cholecystectomy    . Kidney surgery    .  Hernia repair      2010  . Coronary artery bypass graft      x3 .  Dr. NFlorence Canneris present cardiologist  . Vein surgery      s/p injury to left leg from conveyor belt  . Cardiac catheterization      2009  . Portacath placement    . Tee without cardioversion  01/07/2012    Procedure: TRANSESOPHAGEAL ECHOCARDIOGRAM (TEE);  Surgeon: PFay Records MD;  [facility];  Service: Cardiovascular;  Laterality: N/A;  . Atrial flutter ablation  01/07/12    CTI ablation by Dr ARayann Heman . Bladder surgery    . Prostate surgery    . Temporary pacemaker insertion    . Atrial flutter ablation N/A 01/07/2012    Procedure: ATRIAL FLUTTER ABLATION;  Surgeon: JThompson Grayer MD;  [facility];  Service: Cardiovascular;  Laterality: N/A;  . Bladder surgery     Family History  Problem Relation Age of Onset  . Heart disease Mother    Social History  Substance Use Topics  . Smoking status: Former Smoker -- 1.00 packs/day for 40 years  Types: Cigarettes    Quit date: 11/22/2009  . Smokeless tobacco: Never Used  . Alcohol Use: No     Comment: has not drank since 1991    Review of Systems ROS: Statement: All systems negative except as marked or noted in the HPI; Constitutional: Negative for fever and chills. ; ; Eyes: Negative for eye pain, redness and discharge. ; ; ENMT: Negative for ear pain, hoarseness, nasal congestion, sinus pressure and sore throat. ; ; Cardiovascular: +CP. Negative for palpitations, diaphoresis, dyspnea and peripheral edema. ; ; Respiratory: Negative for cough, wheezing and stridor. ; ; Gastrointestinal: +nausea. Negative for vomiting, diarrhea, abdominal pain, blood in stool, hematemesis, jaundice and rectal bleeding. . ; ; Genitourinary: Negative for dysuria, flank pain and hematuria. ; ; Musculoskeletal: Negative for back pain and neck pain. Negative for swelling and trauma.; ; Skin: Negative for pruritus, rash, abrasions, blisters, bruising and skin lesion.; ; Neuro:  Negative for headache, lightheadedness and neck stiffness. Negative for weakness, altered level of consciousness , altered mental status, extremity weakness, paresthesias, involuntary movement, seizure and syncope.      Allergies  Penicillins; Other; and Sulfonamide derivatives  Home Medications   Prior to Admission medications   Medication Sig Start Date End Date Taking? Authorizing Provider  ALPRAZolam Duanne Moron) 1 MG tablet Take 1 tablet (1 mg total) by mouth 2 (two) times daily as needed for anxiety or sleep. 04/04/15  Yes Mary-Margaret Hassell Done, FNP  aspirin 81 MG tablet Take 81 mg by mouth daily.  11/11/13  Yes Historical Provider, MD  atorvastatin (LIPITOR) 40 MG tablet Take 1 tablet (40 mg total) by mouth daily. 04/04/15  Yes Mary-Margaret Hassell Done, FNP  citalopram (CELEXA) 20 MG tablet Take 1 tablet (20 mg total) by mouth every evening. 04/04/15  Yes Mary-Margaret Hassell Done, FNP  fluticasone (FLONASE) 50 MCG/ACT nasal spray Place 2 sprays into both nostrils daily. Patient taking differently: Place 2 sprays into both nostrils daily as needed for allergies.  01/03/15  Yes Tiffany A Gann, PA-C  furosemide (LASIX) 40 MG tablet Take 3 tablets (120 mg total) by mouth daily. Patient taking differently: Take 80 mg by mouth daily.  04/04/15  Yes Mary-Margaret Hassell Done, FNP  Insulin Glargine (TOUJEO SOLOSTAR) 300 UNIT/ML SOPN Inject 65 Units into the skin daily. BID Patient taking differently: Inject 60 Units into the skin daily. BID 06/16/15  Yes Mary-Margaret Hassell Done, FNP  nitroGLYCERIN (NITROSTAT) 0.4 MG SL tablet Place 0.4 mg under the tongue. 09/06/14  Yes Historical Provider, MD  sodium bicarbonate 650 MG tablet Take 1 tablet (650 mg total) by mouth 2 (two) times daily. Take 2 tabs twice dialy Patient taking differently: Take 1,300 mg by mouth 2 (two) times daily. Take 2 tabs twice dialy 05/06/15  Yes Mary-Margaret Hassell Done, FNP  glucose blood (ONETOUCH VERIO) test strip Check blood sugar 1x per day E11.9  12/13/14   Mary-Margaret Hassell Done, FNP   BP 116/76 mmHg  Pulse 87  Temp(Src) 98 F (36.7 C) (Oral)  Resp 18  Ht 6' (1.829 m)  Wt 220 lb (99.791 kg)  BMI 29.83 kg/m2  SpO2 99% Physical Exam  1440: Physical examination:  Nursing notes reviewed; Vital signs and O2 SAT reviewed;  Constitutional: Well developed, Well nourished, Well hydrated, In no acute distress; Head:  Normocephalic, atraumatic; Eyes: EOMI, PERRL, No scleral icterus; ENMT: Mouth and pharynx normal, Mucous membranes moist; Neck: Supple, Full range of motion, No lymphadenopathy; Cardiovascular: Regular rate and rhythm, No gallop; Respiratory: Breath sounds clear & equal bilaterally, No wheezes.  Speaking full sentences with ease, Normal respiratory effort/excursion; Chest: Nontender, Movement normal; Abdomen: Soft, Nontender, Nondistended, Normal bowel sounds. +ostomy. Genitourinary: No CVA tenderness; Extremities: Pulses normal, No tenderness, No edema, No calf edema or asymmetry.; Neuro: AA&Ox3, Major CN grossly intact.  Speech clear. No gross focal motor or sensory deficits in extremities.; Skin: Color normal, Warm, Dry.    ED Course  Procedures (including critical care time) Labs Review   Imaging Review  I have personally reviewed and evaluated these images and lab results as part of my medical decision-making.   EKG Interpretation   Date/Time:  Wednesday June 25 2015 14:11:06 EST Ventricular Rate:  86 PR Interval:  182 QRS Duration: 185 QT Interval:  437 QTC Calculation: 523 R Axis:   -65 Text Interpretation:  Atrial-sensed ventricular-paced rhythm No further  analysis attempted due to paced rhythm Baseline wander When compared with  ECG of 08/30/2014 No significant change was found Confirmed by [facility]  MD,  Nunzio Cory [phone]) on 06/25/2015 3:27:27 PM      MDM  MDM Reviewed: previous chart, nursing note and vitals Reviewed previous: labs and ECG Interpretation: labs, ECG and x-ray      Results for  orders placed or performed during the hospital encounter of 85/63/14  Basic metabolic panel  Result Value Ref Range   Sodium 130 (L) 135 - 145 mmol/L   Potassium 4.4 3.5 - 5.1 mmol/L   Chloride 97 (L) 101 - 111 mmol/L   CO2 24 22 - 32 mmol/L   Glucose, Bld 269 (H) 65 - 99 mg/dL   BUN 52 (H) 6 - 20 mg/dL   Creatinine, Ser 1.75 (H) 0.61 - 1.24 mg/dL   Calcium 8.6 (L) 8.9 - 10.3 mg/dL   GFR calc non Af Amer 39 (L) >60 mL/min   GFR calc Af Amer 45 (L) >60 mL/min   Anion gap 9 5 - 15  CBC  Result Value Ref Range   WBC 6.4 4.0 - 10.5 K/uL   RBC 3.44 (L) 4.22 - 5.81 MIL/uL   Hemoglobin 10.6 (L) 13.0 - 17.0 g/dL   HCT 31.6 (L) 39.0 - 52.0 %   MCV 91.9 78.0 - 100.0 fL   MCH 30.8 26.0 - 34.0 pg   MCHC 33.5 30.0 - 36.0 g/dL   RDW 14.2 11.5 - 15.5 %   Platelets 225 150 - 400 K/uL  Troponin I  Result Value Ref Range   Troponin I 0.04 (H) <0.031 ng/mL   Dg Chest 2 View 06/25/2015  CLINICAL DATA:  Mid chest pain radiating to the right for 4 days. Former smoker. Cough. EXAM: CHEST  2 VIEW COMPARISON:  12/20/2014; chest CT - 12/20/2014 FINDINGS: Grossly unchanged cardiac silhouette and mediastinal contours post median sternotomy and CABG. Interval removal of left jugular approach dialysis catheter. Otherwise, stable position of remaining support apparatus. The lungs remain hyperexpanded. Resolved small left-sided effusion and associated bibasilar opacities. No focal airspace opacities. No evidence of edema or pneumothorax. No acute osseus abnormalities. IMPRESSION: 1. Hyperexpanded lungs without acute cardiopulmonary disease. 2. Resolved small left-sided effusion and associated left basilar atelectasis. Electronically Signed   By: Sandi Mariscal M.D.   On: 06/25/2015 14:56   Results for KAVON, VALENZA (MRN [id number]) as of 06/25/2015 16:45  Ref. Range 12/20/2014 14:15 01/01/2015 15:04 04/04/2015 09:58 06/25/2015 14:28  BUN Latest Ref Range: 6-20 mg/dL 21 (H) 17 38 (H) 52 (H)  Creatinine Latest Ref  Range: 0.61-1.24 mg/dL 1.81 (H) 1.92 (H) 1.61 (H) 1.75 (H)  1655:  ASA, SL ntg and IV morphine given with slow improvement of pain. Troponin mildly elevated, Heart score 6; will observation admit. Dx and testing d/w pt and family.  Questions answered.  Verb understanding, agreeable to admit.  T/C to [facility] Dr. Anastasio Champion, case discussed, including:  HPI, pertinent PM/SHx, VS/PE, dx testing, ED course and treatment:  Agreeable to admit, requests to write temporary orders, obtain observation tele bed to team APAdmits.   Francine Graven, DO 06/28/15 [phone]

## 2015-06-26 ENCOUNTER — Encounter (HOSPITAL_COMMUNITY): Payer: Self-pay | Admitting: Cardiology

## 2015-06-26 DIAGNOSIS — Z955 Presence of coronary angioplasty implant and graft: Secondary | ICD-10-CM

## 2015-06-26 DIAGNOSIS — N183 Chronic kidney disease, stage 3 (moderate): Secondary | ICD-10-CM | POA: Diagnosis not present

## 2015-06-26 DIAGNOSIS — I5032 Chronic diastolic (congestive) heart failure: Secondary | ICD-10-CM | POA: Diagnosis not present

## 2015-06-26 DIAGNOSIS — R079 Chest pain, unspecified: Secondary | ICD-10-CM | POA: Diagnosis not present

## 2015-06-26 DIAGNOSIS — R7989 Other specified abnormal findings of blood chemistry: Secondary | ICD-10-CM

## 2015-06-26 DIAGNOSIS — R778 Other specified abnormalities of plasma proteins: Secondary | ICD-10-CM | POA: Diagnosis present

## 2015-06-26 DIAGNOSIS — Z8551 Personal history of malignant neoplasm of bladder: Secondary | ICD-10-CM

## 2015-06-26 DIAGNOSIS — Z85528 Personal history of other malignant neoplasm of kidney: Secondary | ICD-10-CM

## 2015-06-26 DIAGNOSIS — Z950 Presence of cardiac pacemaker: Secondary | ICD-10-CM | POA: Diagnosis not present

## 2015-06-26 LAB — COMPREHENSIVE METABOLIC PANEL
ALBUMIN: 3.1 g/dL — AB (ref 3.5–5.0)
ALK PHOS: 75 U/L (ref 38–126)
ALT: 29 U/L (ref 17–63)
ANION GAP: 6 (ref 5–15)
AST: 23 U/L (ref 15–41)
BUN: 52 mg/dL — ABNORMAL HIGH (ref 6–20)
CALCIUM: 8.8 mg/dL — AB (ref 8.9–10.3)
CO2: 28 mmol/L (ref 22–32)
Chloride: 101 mmol/L (ref 101–111)
Creatinine, Ser: 1.69 mg/dL — ABNORMAL HIGH (ref 0.61–1.24)
GFR calc Af Amer: 47 mL/min — ABNORMAL LOW (ref >60)
GFR calc non Af Amer: 41 mL/min — ABNORMAL LOW (ref >60)
GLUCOSE: 68 mg/dL (ref 65–99)
Potassium: 4.5 mmol/L (ref 3.5–5.1)
SODIUM: 135 mmol/L (ref 135–145)
Total Bilirubin: 0.5 mg/dL (ref 0.3–1.2)
Total Protein: 6.9 g/dL (ref 6.5–8.1)

## 2015-06-26 LAB — GLUCOSE, CAPILLARY
GLUCOSE-CAPILLARY: 81 mg/dL (ref 65–99)
GLUCOSE-CAPILLARY: 144 mg/dL — AB (ref 65–99)
GLUCOSE-CAPILLARY: 134 mg/dL — AB (ref 65–99)
Glucose-Capillary: 61 mg/dL — ABNORMAL LOW (ref 65–99)
Glucose-Capillary: 52 mg/dL — ABNORMAL LOW (ref 65–99)
Glucose-Capillary: 80 mg/dL (ref 65–99)

## 2015-06-26 LAB — CBC
HCT: 32.7 % — ABNORMAL LOW (ref 39.0–52.0)
HEMOGLOBIN: 10.7 g/dL — AB (ref 13.0–17.0)
MCH: 30.4 pg (ref 26.0–34.0)
MCHC: 32.7 g/dL (ref 30.0–36.0)
MCV: 92.9 fL (ref 78.0–100.0)
Platelets: 258 10*3/uL (ref 150–400)
RBC: 3.52 MIL/uL — AB (ref 4.22–5.81)
RDW: 14.3 % (ref 11.5–15.5)
WBC: 6.8 10*3/uL (ref 4.0–10.5)

## 2015-06-26 LAB — TROPONIN I
Troponin I: 0.05 ng/mL — ABNORMAL HIGH (ref <0.031)
Troponin I: 0.05 ng/mL — ABNORMAL HIGH (ref <0.031)

## 2015-06-26 MED ORDER — INSULIN GLARGINE 100 UNIT/ML ~~LOC~~ SOLN
45.0000 [IU] | Freq: Every day | SUBCUTANEOUS | Status: DC
  Filled 2015-06-26 (×4): qty 0.45

## 2015-06-26 NOTE — Consult Note (Signed)
   [facility] CM Inpatient Consult   06/26/2015  Frederick Foster 02-Oct-1950 [id number]  Spoke with patient at bedside regarding restart [facility] services as patient has had [facility] program services in the past. Patient does not want to participate with [facility] at this time. Patient given [facility] brochure and contact information for future reference.  Of note, [facility] Care Management services would not replace or interfere with any services that are arranged by inpatient case management or social work. Inpatient case manager made aware of above. For additional questions or referrals please contact:  Royetta Crochet. Laymond Purser, RN, BSN, [facility] Liaison [phone]

## 2015-06-26 NOTE — Care Management Note (Signed)
Case Management Note  Patient Details  Name: Frederick Foster MRN: [id number] Date of Birth: Nov 09, 1950  Subjective/Objective:                  Pt admitted from home with CP. Pt lives with his wife and will return home at discharge. Pt is independent with ADL's. Pt has a cane and walker for prn use. Pt also has an ostomy and receives supplies in the mail.  Action/Plan: No CM needs noted.   Expected Discharge Date:                  Expected Discharge Plan:  Home/Self Care  In-House Referral:  NA  Discharge planning Services  CM Consult  Post Acute Care Choice:  NA Choice offered to:  NA  DME Arranged:    DME Agency:     HH Arranged:    HH Agency:     Status of Service:  Completed, signed off  Medicare Important Message Given:    Date Medicare IM Given:    Medicare IM give by:    Date Additional Medicare IM Given:    Additional Medicare Important Message give by:     If discussed at [place] of Stay Meetings, dates discussed:    Additional Comments:  Joylene Draft, RN 06/26/2015, 11:15 AM

## 2015-06-26 NOTE — Progress Notes (Signed)
Patient pills give to son.  Patient had 10pills of different shaped returned to his son and son took medication home.

## 2015-06-26 NOTE — Progress Notes (Signed)
[facility] PROGRESS NOTE  Frederick Foster [id number] DOB: 08-20-1950 DOA: 06/25/2015 PCP: Chevis Pretty, FNP  Assessment/Plan: Chest pain with history of coronary artery disease -Cardiology is planning for an Lexus scan Myoview in a.m. -Currently patient is chest pain-free.  Hypertension -Well-controlled. -Continue current medications.  Type 2 diabetes -Had episode of hypoglycemia this a.m. -We'll decrease his Lantus from 65-45 units this evening, especially as he will be nothing by mouth after midnight for his stress test in a.m.  Chronic kidney disease stage III -Creatinine at baseline of 1.6-1.7.  Code Status: Full code Family Communication: Patient only  Disposition Plan: Stress test in a.m.   Consultants:  Cardiology, Dr. branch   Antibiotics:  None   Subjective: Feels well, no complaints, no chest pain  Objective: Filed Vitals:   06/25/15 2046 06/26/15 0544 06/26/15 1325 06/26/15 1348  BP: 110/57 118/66 118/72   Pulse: 67 87 80   Temp: 97.9 F (36.6 C) 97.9 F (36.6 C) 98 F (36.7 C)   TempSrc: Oral Oral Oral   Resp: 20 20 20    Height:      Weight: 97.75 kg (215 lb 8 oz)     SpO2: 100% 97% 99% 98%    Intake/Output Summary (Last 24 hours) at 06/26/15 1621 Last data filed at 06/26/15 1200  Gross per 24 hour  Intake    490 ml  Output    400 ml  Net     90 ml   Filed Weights   06/25/15 1414 06/25/15 2046  Weight: 99.791 kg (220 lb) 97.75 kg (215 lb 8 oz)    Exam:   General:  Alert, awake, oriented 3, no distress  Cardiovascular: Regular rate and rhythm, no murmurs, rubs or gallops  Respiratory: Clear to auscultation bilaterally  Abdomen: Soft, nontender, nondistended, positive bowel sounds  Extremities: Trace bilateral edema, positive pulses   Neurologic:  Grossly intact and nonfocal  Data Reviewed: Basic Metabolic Panel:  Recent Labs Lab 06/25/15 1428 06/26/15 0434  NA 130* 135  K 4.4 4.5  CL 97*  101  CO2 24 28  GLUCOSE 269* 68  BUN 52* 52*  CREATININE 1.75* 1.69*  CALCIUM 8.6* 8.8*   Liver Function Tests:  Recent Labs Lab 06/26/15 0434  AST 23  ALT 29  ALKPHOS 75  BILITOT 0.5  PROT 6.9  ALBUMIN 3.1*   No results for input(s): LIPASE, AMYLASE in the last 168 hours. No results for input(s): AMMONIA in the last 168 hours. CBC:  Recent Labs Lab 06/25/15 1428 06/26/15 0434  WBC 6.4 6.8  HGB 10.6* 10.7*  HCT 31.6* 32.7*  MCV 91.9 92.9  PLT 225 258   Cardiac Enzymes:  Recent Labs Lab 06/25/15 1428 06/25/15 1840 06/26/15 0019 06/26/15 0438  TROPONINI 0.04* 0.05* 0.05* 0.05*   BNP (last 3 results)  Recent Labs  12/20/14 1415  BNP 1763.0*    ProBNP (last 3 results) No results for input(s): PROBNP in the last 8760 hours.  CBG:  Recent Labs Lab 06/25/15 2045 06/26/15 0735 06/26/15 0814 06/26/15 1131  GLUCAP 200* 61* 81 144*    Recent Results (from the past 240 hour(s))  MRSA PCR Screening     Status: None   Collection Time: 06/25/15  8:50 PM  Result Value Ref Range Status   MRSA by PCR NEGATIVE NEGATIVE Final    Comment:        The GeneXpert MRSA Assay (FDA approved for NASAL specimens only), is one component of a  comprehensive MRSA colonization surveillance program. It is not intended to diagnose MRSA infection nor to guide or monitor treatment for MRSA infections.      Studies: Dg Chest 2 View  06/25/2015  CLINICAL DATA:  Mid chest pain radiating to the right for 4 days. Former smoker. Cough. EXAM: CHEST  2 VIEW COMPARISON:  12/20/2014; chest CT - 12/20/2014 FINDINGS: Grossly unchanged cardiac silhouette and mediastinal contours post median sternotomy and CABG. Interval removal of left jugular approach dialysis catheter. Otherwise, stable position of remaining support apparatus. The lungs remain hyperexpanded. Resolved small left-sided effusion and associated bibasilar opacities. No focal airspace opacities. No evidence of edema or  pneumothorax. No acute osseus abnormalities. IMPRESSION: 1. Hyperexpanded lungs without acute cardiopulmonary disease. 2. Resolved small left-sided effusion and associated left basilar atelectasis. Electronically Signed   By: Sandi Mariscal M.D.   On: 06/25/2015 14:56    Scheduled Meds: . aspirin  81 mg Oral Daily  . atorvastatin  40 mg Oral q1800  . citalopram  20 mg Oral QPM  . enoxaparin (LOVENOX) injection  40 mg Subcutaneous Q24H  . fluticasone  2 spray Each Nare Daily  . furosemide  120 mg Oral Daily  . insulin aspart  0-5 Units Subcutaneous QHS  . insulin aspart  0-9 Units Subcutaneous TID WC  . insulin glargine  65 Units Subcutaneous Daily  . sodium bicarbonate  650 mg Oral BID  . sodium chloride  3 mL Intravenous Q12H   Continuous Infusions:   Principal Problem:   Chest pain with moderate risk of acute coronary syndrome Active Problems:   Hyperlipidemia with target LDL less than 100   Hx of CABG x 3 2008   HTN (hypertension)   Type 2 diabetes mellitus with renal manifestations (HCC)   Chronic diastolic CHF (congestive heart failure) (HCC)   CKD (chronic kidney disease), stage III   Depression with anxiety   Cardiac pac[facility] in [facility]-BS [facility]P [facility]   S/P SVG-PDA stent [facility]) Feb 2016   Troponin level elevated   H/O renal cell cancer-s/p nephrectomy 2011   Hx of bladder cancer- s/p iliostomy 2014    Time spent: 25 minutes. Greater than 50% of this time was spent in direct contact with the patient coordinating care.    Lelon Frohlich  [facility] Pager [phone]  If 7PM-7AM, please contact night-coverage at [email or web address], password [facility] 06/26/2015, 4:21 PM

## 2015-06-26 NOTE — Consult Note (Signed)
Reason for Consult:   Chest pain  Requesting Physician: [facility] Primary Cardiologist [place]  HPI:   64 y/o Caucasian male with a complicated medical history. Pt known to [place], s/p CABG x 3 in 2008 with an LIMA-OM, Radial artery to PDA, and RIMA-LAD. This was complicated by post op sternal wound infection. He subsequently was seen by Dr Rayann Heman in 2013 for PAF/ flutter with SSS component. He had a RFA then and post ablation he had bradycardia and AVB. We had planned conservative treatment but the pt required complicated bladder surgery at [facility] for cancer in NOv 2014 and it was decided there to place a BS pacemaker before surgery. The pt then had a Vision stent placed to the radial artery graft at [facility] Feb 2016. He says his usual angina is Lt shoulder pain. He presented to the ED at [facility] 12/14 with Rt sided chest pain that is worse with movement. No associated nausea, diaphoresis, or SOB. NO change with NTG. His Troponin is elevated but flat-0.05.   PMHx:  Past Medical History  Diagnosis Date  . Mixed hyperlipidemia   . 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 HERNIA, VENTRAL   . CAROTID BRUIT   . Coronary artery disease     h/o MI and bypass surgery x 3 in [id number] complicated by sternal wound infection, sees Dr. Johnsie Cancel  . Vein symptom     injury to left leg due to conveyor belt.  caused injury to veins in left leg/ s/p surgery   . Sinus congestion     has allergies  . AODM   . Hiatal hernia     s/p hiatal hernia surgery 2010  . Anxiety     history of panic attacks  . Depression     takes celexa  . Atrial flutter ([place]) 11/19/2011  . Atrial fibrillation ([place])   . Open abdominal wall wound   . Cancer ([place])      kidney ca, also mass to left neck  . Lymphoma ([place])   . Bladder cancer ([place])   . Prostate cancer ([place])   . Non Hodgkin's lymphoma ([place])   . CKD (chronic kidney disease), stage III     has one  kidney.  had left kidney removed in 2011 for cancer  . Chronic kidney disease, stage III (moderate)   . Acute renal failure ([place]) 04/06/2013    With hyperkalemia and met acidosis    Past Surgical History  Procedure Laterality Date  . Cholecystectomy    . Kidney surgery    . Hernia repair      2010  . Coronary artery bypass graft      x3 .  Dr. Florence Canner is present cardiologist  . Vein surgery      s/p injury to left leg from conveyor belt  . Cardiac catheterization      2009  . Portacath placement    . Tee without cardioversion  01/07/2012    Procedure: TRANSESOPHAGEAL ECHOCARDIOGRAM (TEE);  Surgeon: Fay Records, MD;  Location[facility] ENDOSCOPY;  Service: Cardiovascular;  Laterality: N/A;  . Coronary angioplasty with stent placement  01/07/12    CTI ablation by Dr Rayann Heman  . Bladder [facility]  09/05/14    [facility]  . Prostate surgery    . [facility]  [facility]  . Atrial flutter ablation  N/A 01/07/2012    Procedure: ATRIAL FLUTTER ABLATION;  Surgeon: Thompson Grayer, MD;  Location[facility] CATH LAB;  Service: Cardiovascular;  Laterality: N/A;  . Bladder surgery      SOCHx:  reports that he quit smoking about 5 years ago. His smoking use included Cigarettes. He has a 40 pack-year smoking history. He has never used smokeless tobacco. He reports that he does not drink alcohol or use illicit drugs.  FAMHx: Family History  Problem Relation Age of Onset  . Heart disease Mother     ALLERGIES: Allergies  Allergen Reactions  . Penicillins Anaphylaxis    Has patient had a PCN reaction causing immediate rash, facial/tongue/throat swelling, SOB or lightheadedness with hypotension: No Has patient had a PCN reaction causing severe rash involving mucus membranes or skin necrosis: No Has patient had a PCN reaction that required hospitalization Yes Has patient had a PCN reaction occurring within the last 10 years: No If all of the above answers are "NO", then may proceed with  Cephalosporin use.   . Other Other (See Comments)    "I'm allergic to a certain type of sutures."  . Sulfonamide Derivatives Rash    ROS: Review of Systems: General: negative for chills, fever, night sweats or weight changes.  Cardiovascular: negative for chest pain, dyspnea on exertion, edema, orthopnea, palpitations, paroxysmal nocturnal dyspnea or shortness of breath HEENT: negative for any visual disturbances, blindness, glaucoma Dermatological: negative for rash Respiratory: negative for cough, hemoptysis, or wheezing Urologic: negative for hematuria or dysuria Abdominal: negative for nausea, vomiting, diarrhea, bright red blood per rectum, melena, or hematemesis Neurologic: negative for visual changes, syncope, or dizziness, unable to dorsiflex his Lt foot Musculoskeletal: negative for back pain, joint pain, or swelling Psych: cooperative and appropriate All other systems reviewed and are otherwise negative except as noted above.   HOME MEDICATIONS: Prior to Admission medications   Medication Sig Start Date End Date Taking? Authorizing Provider  ALPRAZolam Duanne Moron) 1 MG tablet Take 1 tablet (1 mg total) by mouth 2 (two) times daily as needed for anxiety or sleep. 04/04/15  Yes Mary-Margaret Hassell Done, FNP  aspirin 81 MG tablet Take 81 mg by mouth daily.  11/11/13  Yes Historical Provider, MD  atorvastatin (LIPITOR) 40 MG tablet Take 1 tablet (40 mg total) by mouth daily. 04/04/15  Yes Mary-Margaret Hassell Done, FNP  citalopram (CELEXA) 20 MG tablet Take 1 tablet (20 mg total) by mouth every evening. 04/04/15  Yes Mary-Margaret Hassell Done, FNP  fluticasone (FLONASE) 50 MCG/ACT nasal spray Place 2 sprays into both nostrils daily. Patient taking differently: Place 2 sprays into both nostrils daily as needed for allergies.  01/03/15  Yes Tiffany A Gann, PA-C  furosemide (LASIX) 40 MG tablet Take 3 tablets (120 mg total) by mouth daily. Patient taking differently: Take 80 mg by mouth daily.  04/04/15   Yes Mary-Margaret Hassell Done, FNP  Insulin Glargine (TOUJEO SOLOSTAR) 300 UNIT/ML SOPN Inject 65 Units into the skin daily. BID Patient taking differently: Inject 60 Units into the skin daily. BID 06/16/15  Yes Mary-Margaret Hassell Done, FNP  nitroGLYCERIN (NITROSTAT) 0.4 MG SL tablet Place 0.4 mg under the tongue. 09/06/14  Yes Historical Provider, MD  sodium bicarbonate 650 MG tablet Take 1 tablet (650 mg total) by mouth 2 (two) times daily. Take 2 tabs twice dialy Patient taking differently: Take 1,300 mg by mouth 2 (two) times daily. Take 2 tabs twice dialy 05/06/15  Yes Mary-Margaret Hassell Done, FNP  glucose blood (ONETOUCH VERIO) test strip Check blood sugar  1x per day E11.9 12/13/14   Mary-Margaret Hassell Done, [facility] MEDICATIONS: I have reviewed the patient's current medications.  VITALS: Blood pressure 118/66, pulse 87, temperature 97.9 F (36.6 C), temperature source Oral, resp. rate 20, height 6' (1.829 m), weight 215 lb 8 oz (97.75 kg), SpO2 97 %.  PHYSICAL EXAM: General appearance: alert, cooperative and no distress Neck: no JVD and soft carotid bruit Lungs: clear to auscultation bilaterally Heart: regular rate and rhythm Abdomen: iliostomy in place Extremities: no edema Pulses: diminnished distal pulses Skin: Skin color, texture, turgor normal. No rashes or lesions Neurologic: Grossly normal  LABS: Results for orders placed or performed during the hospital encounter of 06/25/15 (from the past 24 hour(s))  Basic metabolic panel     Status: Abnormal   Collection Time: 06/25/15  2:28 PM  Result Value Ref Range   Sodium 130 (L) 135 - 145 mmol/L   Potassium 4.4 3.5 - 5.1 mmol/L   Chloride 97 (L) 101 - 111 mmol/L   CO2 24 22 - 32 mmol/L   Glucose, Bld 269 (H) 65 - 99 mg/dL   BUN 52 (H) 6 - 20 mg/dL   Creatinine, Ser 1.75 (H) 0.61 - 1.24 mg/dL   Calcium 8.6 (L) 8.9 - 10.3 mg/dL   GFR calc non Af Amer 39 (L) >60 mL/min   GFR calc Af Amer 45 (L) >60 mL/min   Anion gap 9 5 - 15  CBC      Status: Abnormal   Collection Time: 06/25/15  2:28 PM  Result Value Ref Range   WBC 6.4 4.0 - 10.5 K/uL   RBC 3.44 (L) 4.22 - 5.81 MIL/uL   Hemoglobin 10.6 (L) 13.0 - 17.0 g/dL   HCT 31.6 (L) 39.0 - 52.0 %   MCV 91.9 78.0 - 100.0 fL   MCH 30.8 26.0 - 34.0 pg   MCHC 33.5 30.0 - 36.0 g/dL   RDW 14.2 11.5 - 15.5 %   Platelets 225 150 - 400 K/uL  Troponin I     Status: Abnormal   Collection Time: 06/25/15  2:28 PM  Result Value Ref Range   Troponin I 0.04 (H) <0.031 ng/mL  Troponin I     Status: Abnormal   Collection Time: 06/25/15  6:40 PM  Result Value Ref Range   Troponin I 0.05 (H) <0.031 ng/mL  Glucose, capillary     Status: Abnormal   Collection Time: 06/25/15  8:45 PM  Result Value Ref Range   Glucose-Capillary 200 (H) 65 - 99 mg/dL   Comment 1 Notify RN    Comment 2 Document in Chart   MRSA PCR Screening     Status: None   Collection Time: 06/25/15  8:50 PM  Result Value Ref Range   MRSA by PCR NEGATIVE NEGATIVE  Troponin I     Status: Abnormal   Collection Time: 06/26/15 12:19 AM  Result Value Ref Range   Troponin I 0.05 (H) <0.031 ng/mL  Comprehensive metabolic panel     Status: Abnormal   Collection Time: 06/26/15  4:34 AM  Result Value Ref Range   Sodium 135 135 - 145 mmol/L   Potassium 4.5 3.5 - 5.1 mmol/L   Chloride 101 101 - 111 mmol/L   CO2 28 22 - 32 mmol/L   Glucose, Bld 68 65 - 99 mg/dL   BUN 52 (H) 6 - 20 mg/dL   Creatinine, Ser 1.69 (H) 0.61 - 1.24 mg/dL   Calcium 8.8 (L) 8.9 -  10.3 mg/dL   Total Protein 6.9 6.5 - 8.1 g/dL   Albumin 3.1 (L) 3.5 - 5.0 g/dL   AST 23 15 - 41 U/L   ALT 29 17 - 63 U/L   Alkaline Phosphatase 75 38 - 126 U/L   Total Bilirubin 0.5 0.3 - 1.2 mg/dL   GFR calc non Af Amer 41 (L) >60 mL/min   GFR calc Af Amer 47 (L) >60 mL/min   Anion gap 6 5 - 15  CBC     Status: Abnormal   Collection Time: 06/26/15  4:34 AM  Result Value Ref Range   WBC 6.8 4.0 - 10.5 K/uL   RBC 3.52 (L) 4.22 - 5.81 MIL/uL   Hemoglobin 10.7 (L)  13.0 - 17.0 g/dL   HCT 32.7 (L) 39.0 - 52.0 %   MCV 92.9 78.0 - 100.0 fL   MCH 30.4 26.0 - 34.0 pg   MCHC 32.7 30.0 - 36.0 g/dL   RDW 14.3 11.5 - 15.5 %   Platelets 258 150 - 400 K/uL  Troponin I     Status: Abnormal   Collection Time: 06/26/15  4:38 AM  Result Value Ref Range   Troponin I 0.05 (H) <0.031 ng/mL  Glucose, capillary     Status: Abnormal   Collection Time: 06/26/15  7:35 AM  Result Value Ref Range   Glucose-Capillary 61 (L) 65 - 99 mg/dL   Comment 1 Notify RN    Comment 2 Document in Chart   Glucose, capillary     Status: None   Collection Time: 06/26/15  8:14 AM  Result Value Ref Range   Glucose-Capillary 81 65 - 99 mg/dL   Comment 1 Notify RN    Comment 2 Document in Chart     EKG: Paced  IMAGING: Dg Chest 2 View  06/25/2015  CLINICAL DATA:  Mid chest pain radiating to the right for 4 days. Former smoker. Cough. EXAM: CHEST  2 VIEW COMPARISON:  12/20/2014; chest CT - 12/20/2014 FINDINGS: Grossly unchanged cardiac silhouette and mediastinal contours post median sternotomy and CABG. Interval removal of left jugular approach dialysis catheter. Otherwise, stable position of remaining support apparatus. The lungs remain hyperexpanded. Resolved small left-sided effusion and associated bibasilar opacities. No focal airspace opacities. No evidence of edema or pneumothorax. No acute osseus abnormalities. IMPRESSION: 1. Hyperexpanded lungs without acute cardiopulmonary disease. 2. Resolved small left-sided effusion and associated left basilar atelectasis. Electronically Signed   By: Sandi Mariscal M.D.   On: 06/25/2015 14:56    IMPRESSION: Principal Problem:   Chest pain with moderate risk of acute coronary syndrome Active Problems:   Troponin level elevated   Hx of CABG x 3 2008   HTN (hypertension)   Type 2 diabetes mellitus with renal manifestations (HCC)   CKD (chronic kidney disease), stage III   S/P SVG-PDA stent [facility]) Feb 2016   Hyperlipidemia with target LDL  less than 100   Chronic diastolic CHF (congestive heart failure) ([place])   Depression with anxiety   Cardiac pacemaker in situ-BS PTVDP placed Nov 2014- [facility]   H/O renal cell cancer-s/p nephrectomy 2011   Hx of bladder cancer- s/p iliostomy 2014   RECOMMENDATION: Chest pain sounds atypical for his angina. Troponin elevation could be from CRI. Despite that he should probably have a Myoview, will discuss timing with MD (he ate this morning).  Time Spent Directly with Patient: [address] minutes  Kerin Ransom, [place] beeper 06/26/2015, 9:02 AM   Attending Note Patient seen  and discussed with PA [place], I agree with his documentation above. Patient with extensive CAD history as described above. Presents with somewhat atypical chest pain. Interestingly he reports his two prior MIs presented with only pain on the top of his left shoulder. Current pain is right sided and somewhat positional He has a mild flat nonspecific troponin elevation, EKG is vpaced and cannot be interprerted for ischemia. He is diabetic and at risk for atypical angina and actually has a history of atypical angina. We will proceed with lexiscan stress test tomorrow, would not exercise due chronic leg weakness. Make NPO at midnight.    Zandra Abts MD

## 2015-06-26 NOTE — Care Management Obs Status (Signed)
[place] NOTIFICATION   Patient Details  Name: Frederick Foster MRN: [id number] Date of Birth: 1950/11/17   Medicare Observation Status Notification Given:  Yes    Joylene Draft, RN 06/26/2015, 11:15 AM

## 2015-06-26 NOTE — Progress Notes (Signed)
Blood sugar 61, apple juice given, recheck resulting in 81.

## 2015-06-26 NOTE — Progress Notes (Signed)
Inpatient Diabetes Program Recommendations  AACE/ADA: New Consensus Statement on Inpatient Glycemic Control (2015)  Target Ranges:  Prepandial:   less than 140 mg/dL      Peak postprandial:   less than 180 mg/dL (1-2 hours)      Critically ill patients:  140 - 180 mg/dL   Review of Glycemic Control  Diabetes history: DM 2 Outpatient Diabetes medications: Amaryl 4 mg and toujeo 60 units bid Current orders for Inpatient glycemic control: Lantus 65 units daily and sensitive correction tidwc and HS scales. (Not yet started)  Inpatient Diabetes Program Recommendations: Noted patient with hypoglycemia this am following hyperglycemia on admission yesterday. No insulin yet started-may want to use the sensitive correction q 6 hrs while NPO (considerating CKD III) Once glucose has normalized may then want to restart the lantus at half home dose.  Will follow.  Thank you Rosita Kea, RN, MSN, CDE  Diabetes Inpatient Program Office: [phone] Pager: [phone] 8:00 am to 5:00 pm

## 2015-06-27 ENCOUNTER — Observation Stay (HOSPITAL_COMMUNITY): Payer: Commercial Managed Care - HMO

## 2015-06-27 ENCOUNTER — Encounter (HOSPITAL_COMMUNITY): Payer: Self-pay

## 2015-06-27 ENCOUNTER — Observation Stay (HOSPITAL_BASED_OUTPATIENT_CLINIC_OR_DEPARTMENT_OTHER): Payer: Commercial Managed Care - HMO

## 2015-06-27 DIAGNOSIS — I5032 Chronic diastolic (congestive) heart failure: Secondary | ICD-10-CM | POA: Diagnosis not present

## 2015-06-27 DIAGNOSIS — R079 Chest pain, unspecified: Secondary | ICD-10-CM

## 2015-06-27 DIAGNOSIS — N183 Chronic kidney disease, stage 3 (moderate): Secondary | ICD-10-CM | POA: Diagnosis not present

## 2015-06-27 DIAGNOSIS — Z950 Presence of cardiac pacemaker: Secondary | ICD-10-CM

## 2015-06-27 LAB — NM MYOCAR MULTI W/SPECT W/WALL MOTION / EF
CHL CUP NUCLEAR SRS: 17
CHL CUP NUCLEAR SSS: 18
CHL CUP RESTING HR STRESS: 82 {beats}/min
CSEPPHR: 103 {beats}/min
LV sys vol: 203 mL
LVDIAVOL: 229 mL
RATE: 0.58
SDS: 1
TID: 1.69

## 2015-06-27 LAB — GLUCOSE, CAPILLARY
Glucose-Capillary: 52 mg/dL — ABNORMAL LOW (ref 65–99)
Glucose-Capillary: 85 mg/dL (ref 65–99)
Glucose-Capillary: 139 mg/dL — ABNORMAL HIGH (ref 65–99)
Glucose-Capillary: 127 mg/dL — ABNORMAL HIGH (ref 65–99)

## 2015-06-27 MED ORDER — TECHNETIUM TC 99M SESTAMIBI - CARDIOLITE
10.0000 | Freq: Once | INTRAVENOUS | Status: AC | PRN
  Administered 2015-06-27: 10 via INTRAVENOUS

## 2015-06-27 MED ORDER — TECHNETIUM TC 99M SESTAMIBI GENERIC - CARDIOLITE
30.0000 | Freq: Once | INTRAVENOUS | Status: AC | PRN
  Administered 2015-06-27: 30.4 via INTRAVENOUS

## 2015-06-27 MED ORDER — SODIUM CHLORIDE 0.9 % IJ SOLN
INTRAMUSCULAR | Status: AC
  Administered 2015-06-27: 10 mL via INTRAVENOUS
  Filled 2015-06-27: qty 3

## 2015-06-27 MED ORDER — INSULIN GLARGINE 300 UNIT/ML ~~LOC~~ SOPN: 45.0000 [IU] | PEN_INJECTOR | Freq: Every day | SUBCUTANEOUS | Status: AC

## 2015-06-27 MED ORDER — SODIUM CHLORIDE 0.9 % IJ SOLN
INTRAMUSCULAR | Status: AC
  Filled 2015-06-27: qty 48

## 2015-06-27 MED ORDER — REGADENOSON 0.4 MG/5ML IV SOLN
INTRAVENOUS | Status: AC
  Administered 2015-06-27: 0.4 mg via INTRAVENOUS
  Filled 2015-06-27: qty 5

## 2015-06-27 MED ORDER — HEPARIN SOD (PORK) LOCK FLUSH 100 UNIT/ML IV SOLN: 500.0000 [IU] | Freq: Once | INTRAVENOUS | Status: DC

## 2015-06-27 NOTE — Progress Notes (Addendum)
Patient discharged with instructions, prescription, and care notes.  Verbalized understanding via teach back.  IV was removed and the site was WNL. Patient voiced no further complaints or concerns at the time of discharge.  Appointments scheduled per instructions.  Patient left the floor via w/c with staff and family in stable condition.  Patient stated that he already takes insulin at home and refused the prescription that MD left for him to take home.  He states lantus makes his glucose drop.

## 2015-06-27 NOTE — Discharge Summary (Signed)
Physician Discharge Summary  Frederick Foster [id number] DOB: 08-03-1950 DOA: 06/25/2015  PCP: Chevis Pretty, FNP  Admit date: 06/25/2015 Discharge date: 06/27/2015  Time spent: 45 minutes  Recommendations for Outpatient Follow-up:  -We'll be discharged home today. -Advised to follow-up with his cardiologist at [facility].  Discharge Diagnoses:  Principal Problem:   Chest pain with moderate risk of acute coronary syndrome Active Problems:   Hyperlipidemia with target LDL less than 100   Hx of CABG x 3 2008   HTN (hypertension)   Type 2 diabetes mellitus with renal manifestations (HCC)   Chronic diastolic CHF (congestive heart failure) ([place])   CKD (chronic kidney disease), stage III   Depression with anxiety   Cardiac pacemaker in situ-BS PTVDP placed Nov 2014- [facility]   S/P SVG-PDA stent [facility]) Feb 2016   Troponin level elevated   H/O renal cell cancer-s/p nephrectomy 2011   Hx of bladder cancer- s/p iliostomy 2014   Discharge Condition: Stable and improved  Filed Weights   06/25/15 1414 06/25/15 2046  Weight: 99.791 kg (220 lb) 97.75 kg (215 lb 8 oz)    History of present illness:  As per Dr. Anastasio Champion on 12/14: Frederick Foster is a 64 y.o. 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 He describes it as dull in nature and seems to radiate to his back. However, earlier he had given the history that it seems to radiate to the right side of his chest to the ER physician. Approximately 3 weeks ago he had been lifting a heavy door. He denies any chest pain after this had started to get the pain 4 days ago. He told the emergency room physician that the pain is worse with exertion and last approximately 20 minutes and improves with rest. However, he tells me  that he has had it all night long and into today and this is why he came to the emergency room. He says that the pain sometimes makes him sweat and nauseous. It is not associated with dyspnea. He is now being admitted for further investigation.  Hospital Course:   Chest pain with history of coronary artery disease -Lexiscan Myoview shows extensive prior scar but no current myocardium at jeopardy. There is significantly decreased left ventricular ejection fraction by nuclear study which is unchanged from echo at [facility] in April 2016. -No further management this hospitalization, cardiology has signed off. -Continue home regimen of medications.  Hypertension -Well-controlled. -Continue current medications.  Type 2 diabetes -Lantus has been decreased to 45 units from 65 units given 2 episodes of morning hypoglycemia. -We'll need continued diabetic management in the outpatient setting.  Chronic kidney disease stage III -Creatinine at baseline of 1.6-1.7.  Procedures:   Stress Myoview with results as above  Consultations:   Cardiology, Dr. Harl Bowie  Discharge Instructions  Discharge Instructions    Diet - low sodium heart healthy    Complete by:  As directed      Increase activity slowly    Complete by:  As directed             Medication List    TAKE these medications        ALPRAZolam 1 MG tablet  Commonly known as:  XANAX  Take 1 tablet (1 mg total) by mouth 2 (two)  times daily as needed for anxiety or sleep.     aspirin 81 MG tablet  Take 81 mg by mouth daily.     atorvastatin 40 MG tablet  Commonly known as:  LIPITOR  Take 1 tablet (40 mg total) by mouth daily.     citalopram 20 MG tablet  Commonly known as:  CELEXA  Take 1 tablet (20 mg total) by mouth every evening.     fluticasone 50 MCG/ACT nasal spray  Commonly known as:  FLONASE  Place 2 sprays into both nostrils daily.     furosemide 40 MG tablet  Commonly known as:  LASIX  Take 3 tablets  (120 mg total) by mouth daily.     glimepiride 4 MG tablet  Commonly known as:  AMARYL  Take 4 mg by mouth daily with breakfast.     glucose blood test strip  Commonly known as:  ONETOUCH VERIO  Check blood sugar 1x per day E11.9     Insulin Glargine 300 UNIT/ML Sopn  Commonly known as:  TOUJEO SOLOSTAR  Inject 45 Units into the skin daily. BID     nitroGLYCERIN 0.4 MG SL tablet  Commonly known as:  NITROSTAT  Place 0.4 mg under the tongue.     sodium bicarbonate 650 MG tablet  Take 1 tablet (650 mg total) by mouth 2 (two) times daily. Take 2 tabs twice dialy       Allergies  Allergen Reactions  . Penicillins Anaphylaxis    Has patient had a PCN reaction causing immediate rash, facial/tongue/throat swelling, SOB or lightheadedness with hypotension: No Has patient had a PCN reaction causing severe rash involving mucus membranes or skin necrosis: No Has patient had a PCN reaction that required hospitalization Yes Has patient had a PCN reaction occurring within the last 10 years: No If all of the above answers are "NO", then may proceed with Cephalosporin use.   . Other Other (See Comments)    "I'm allergic to a certain type of sutures."  . Sulfonamide Derivatives Rash       Follow-up Information    Follow up with Chevis Pretty, FNP. Schedule an appointment as soon as possible for a visit in 2 weeks.   Specialty:  Family Medicine   Contact information:   [place] [address] [phone]        The results of significant diagnostics from this hospitalization (including imaging, microbiology, ancillary and laboratory) are listed below for reference.    Significant Diagnostic Studies: Dg Chest 2 View  06/25/2015  CLINICAL DATA:  Mid chest pain radiating to the right for 4 days. Former smoker. Cough. EXAM: CHEST  2 VIEW COMPARISON:  12/20/2014; chest CT - 12/20/2014 FINDINGS: Grossly unchanged cardiac silhouette and mediastinal contours post  median sternotomy and CABG. Interval removal of left jugular approach dialysis catheter. Otherwise, stable position of remaining support apparatus. The lungs remain hyperexpanded. Resolved small left-sided effusion and associated bibasilar opacities. No focal airspace opacities. No evidence of edema or pneumothorax. No acute osseus abnormalities. IMPRESSION: 1. Hyperexpanded lungs without acute cardiopulmonary disease. 2. Resolved small left-sided effusion and associated left basilar atelectasis. Electronically Signed   By: Sandi Mariscal M.D.   On: 06/25/2015 14:56   Nm Myocar Multi W/spect W/wall Motion / Ef  06/27/2015   Findings consistent with prior extensive myocardial infarction involving the anterior, anteroseptal, inferior, and inferoseptal walls. There is no current myocardium at jeopardy.  The left ventricular ejection fraction is severely decreased (<30%).  TID is elevated. Consider correlated LVEF findings with echocardiogram.  This is a high risk study. High risk due to low ejection fraction and large area of prior infarct. There is no current myocardium at jeopardy.     Microbiology: Recent Results (from the past 240 hour(s))  MRSA PCR Screening     Status: None   Collection Time: 06/25/15  8:50 PM  Result Value Ref Range Status   MRSA by PCR NEGATIVE NEGATIVE Final    Comment:        The GeneXpert MRSA Assay (FDA approved for NASAL specimens only), is one component of a comprehensive MRSA colonization surveillance program. It is not intended to diagnose MRSA infection nor to guide or monitor treatment for MRSA infections.      Labs: Basic Metabolic Panel:  Recent Labs Lab 06/25/15 1428 06/26/15 0434  NA 130* 135  K 4.4 4.5  CL 97* 101  CO2 24 28  GLUCOSE 269* 68  BUN 52* 52*  CREATININE 1.75* 1.69*  CALCIUM 8.6* 8.8*   Liver Function Tests:  Recent Labs Lab 06/26/15 0434  AST 23  ALT 29  ALKPHOS 75  BILITOT 0.5  PROT 6.9  ALBUMIN 3.1*   No results  for input(s): LIPASE, AMYLASE in the last 168 hours. No results for input(s): AMMONIA in the last 168 hours. CBC:  Recent Labs Lab 06/25/15 1428 06/26/15 0434  WBC 6.4 6.8  HGB 10.6* 10.7*  HCT 31.6* 32.7*  MCV 91.9 92.9  PLT 225 258   Cardiac Enzymes:  Recent Labs Lab 06/25/15 1428 06/25/15 1840 06/26/15 0019 06/26/15 0438  TROPONINI 0.04* 0.05* 0.05* 0.05*   BNP: BNP (last 3 results)  Recent Labs  12/20/14 1415  BNP 1763.0*    ProBNP (last 3 results) No results for input(s): PROBNP in the last 8760 hours.  CBG:  Recent Labs Lab 06/26/15 2056 06/27/15 0348 06/27/15 0433 06/27/15 1001 06/27/15 1147  GLUCAP 134* 52* 85 139* 127*       Signed:  [facility] Pager: [phone] 06/27/2015, 2:32 PM

## 2015-06-27 NOTE — Progress Notes (Signed)
Consulting cardiologist: Carlyle Dolly MD Primary Cardiologist: [facility] cardiologist.   Cardiology Specific Problem List: 1. Chest Pain-NSTEMI 2. CAD 3. Hx of Atrial flutter   Subjective:    Patient denies any discomfort. Assessment completed in stress lab  Objective:   Temp:  [97.9 F (36.6 C)-98.1 F (36.7 C)] 98.1 F (36.7 C) (12/16 0606) Pulse Rate:  [80-89] 89 (12/16 0606) Resp:  [16-20] 16 (12/16 0606) BP: (108-140)/(55-72) 108/55 mmHg (12/16 0606) SpO2:  [98 %-100 %] 98 % (12/16 0606) Last BM Date: 06/26/15  Filed Weights   06/25/15 1414 06/25/15 2046  Weight: 220 lb (99.791 kg) 215 lb 8 oz (97.75 kg)    Intake/Output Summary (Last 24 hours) at 06/27/15 1020 Last data filed at 06/27/15 0607  Gross per 24 hour  Intake    480 ml  Output    775 ml  Net   -295 ml    Telemetry: RBBB with V-pacing.   Exam:  General: No acute distress.  HEENT: Conjunctiva and lids normal, oropharynx clear.  Lungs: Clear to auscultation, nonlabored.  Cardiac: No elevated JVP or bruits. RRR, no gallop or rub.   Abdomen: Normoactive bowel sounds, nontender, nondistended.  Extremities: No pitting edema, distal pulses full.  Neuropsychiatric: Alert and oriented x3, affect appropriate.   Lab Results:  Basic Metabolic Panel:  Recent Labs Lab 06/25/15 1428 06/26/15 0434  NA 130* 135  K 4.4 4.5  CL 97* 101  CO2 24 28  GLUCOSE 269* 68  BUN 52* 52*  CREATININE 1.75* 1.69*  CALCIUM 8.6* 8.8*    Liver Function Tests:  Recent Labs Lab 06/26/15 0434  AST 23  ALT 29  ALKPHOS 75  BILITOT 0.5  PROT 6.9  ALBUMIN 3.1*    CBC:  Recent Labs Lab 06/25/15 1428 06/26/15 0434  WBC 6.4 6.8  HGB 10.6* 10.7*  HCT 31.6* 32.7*  MCV 91.9 92.9  PLT 225 258    Cardiac Enzymes:  Recent Labs Lab 06/25/15 1840 06/26/15 0019 06/26/15 0438  TROPONINI 0.05* 0.05* 0.05*    BNP: No results for input(s): PROBNP in the last 8760 hours.  Coagulation: No  results for input(s): INR in the last 168 hours.  Radiology: Dg Chest 2 View  06/25/2015  CLINICAL DATA:  Mid chest pain radiating to the right for 4 days. Former smoker. Cough. EXAM: CHEST  2 VIEW COMPARISON:  12/20/2014; chest CT - 12/20/2014 FINDINGS: Grossly unchanged cardiac silhouette and mediastinal contours post median sternotomy and CABG. Interval removal of left jugular approach dialysis catheter. Otherwise, stable position of remaining support apparatus. The lungs remain hyperexpanded. Resolved small left-sided effusion and associated bibasilar opacities. No focal airspace opacities. No evidence of edema or pneumothorax. No acute osseus abnormalities. IMPRESSION: 1. Hyperexpanded lungs without acute cardiopulmonary disease. 2. Resolved small left-sided effusion and associated left basilar atelectasis. Electronically Signed   By: Sandi Mariscal M.D.   On: 06/25/2015 14:56      Medications:   Scheduled Medications: . aspirin  81 mg Oral Daily  . atorvastatin  40 mg Oral q1800  . citalopram  20 mg Oral QPM  . enoxaparin (LOVENOX) injection  40 mg Subcutaneous Q24H  . fluticasone  2 spray Each Nare Daily  . furosemide  120 mg Oral Daily  . insulin aspart  0-5 Units Subcutaneous QHS  . insulin aspart  0-9 Units Subcutaneous TID WC  . insulin glargine  45 Units Subcutaneous Daily  . sodium bicarbonate  650 mg Oral BID  .  sodium chloride  3 mL Intravenous Q12H  . sodium chloride        Infusions:    PRN Medications: ALPRAZolam, morphine injection, nitroGLYCERIN, ondansetron **OR** ondansetron (ZOFRAN) IV, technetium sestamibi generic   Assessment and Plan:   1.Chest pain: Lexiscan stress completed. No acute changes, but baseline BBB and V-pacing is noted. No chest pain during stress portion. Nuclear scintigraphy to follow. Will make recommendations once results are provided. Only mildly elevated troponin, stable and flat.   2. CAD: Hx of CABG in 2009. Now followed by [facility]  cardiologists. Continue ASA,statin,. Not on BB due to AV block by hx.   Phill Myron. Lawrence NP [place]  06/27/2015, 10:20 AM   Patient seen and dicussed with NP Lawerence, I agree with her documentation. Carlton Adam shows extensive prior scar but no current myocardium at jeopardy. Significantly decreased LVEF by nuclear study which is a new finding based on our records, however echo at [facility] 10/2014 showed severely reduced LVEF at that time. We will defer long term management to his primary cardiologist at [facility], continue his home regimen of medications. Note recent stent 10/2014 was BMS due to history of GU bleeds, thus he is no long on DAPT.  We will sign off inpatient care, ok for discharge from out standpoint.    Zandra Abts MD

## 2015-06-27 NOTE — Care Management Note (Signed)
Case Management Note  Patient Details  Name: Frederick Foster MRN: [id number] Date of Birth: Nov 09, 1950  Subjective/Objective:                    Action/Plan:   Expected Discharge Date:                  Expected Discharge Plan:  Home/Self Care  In-House Referral:  NA  Discharge planning Services  CM Consult  Post Acute Care Choice:  NA Choice offered to:  NA  DME Arranged:    DME Agency:     HH Arranged:    HH Agency:     Status of Service:  Completed, signed off  Medicare Important Message Given:    Date Medicare IM Given:    Medicare IM give by:    Date Additional Medicare IM Given:    Additional Medicare Important Message give by:     If discussed at [place] of Stay Meetings, dates discussed:    Additional Comments: Pt discharged home today. No CM needs noted. Christinia Gully [place], RN 06/27/2015, 12:58 PM

## 2015-06-30 ENCOUNTER — Telehealth: Payer: Self-pay | Admitting: *Deleted

## 2015-06-30 DIAGNOSIS — I5032 Chronic diastolic (congestive) heart failure: Secondary | ICD-10-CM

## 2015-06-30 DIAGNOSIS — F418 Other specified anxiety disorders: Secondary | ICD-10-CM

## 2015-06-30 MED ORDER — CITALOPRAM HYDROBROMIDE 20 MG PO TABS: 20.0000 mg | ORAL_TABLET | Freq: Every evening | ORAL | Status: AC

## 2015-06-30 MED ORDER — FUROSEMIDE 40 MG PO TABS: 80.0000 mg | ORAL_TABLET | Freq: Every day | ORAL | Status: AC

## 2015-06-30 MED ORDER — HYDROCODONE-ACETAMINOPHEN 5-325 MG PO TABS: 1.0000 | ORAL_TABLET | Freq: Four times a day (QID) | ORAL | Status: DC | PRN

## 2015-06-30 NOTE — Telephone Encounter (Signed)
Patient was given hydrocodone in 11/2014 for moderate pain. He was given 60 tablets and just ran out. He doesn't use them often but they do help with his chest pain.  He has an appointment scheduled for 07/04/15 and he wanted to know if he would be able to get a prescription before then.

## 2015-06-30 NOTE — Telephone Encounter (Signed)
Call Completed and Appointment Scheduled: Yes, Date: 06/04/15 with [place] INFORMATION Date of Discharge:06/27/15  Discharge Facility: [facility]   Principal Discharge Diagnosis: Chest Pain  Patient and/or caregiver is knowledgeable of his/her condition(s) and treatment: Yes   MEDICATION RECONCILIATION Medication list reviewed with patient: Yes  Patient is able to obtain needed medications: Yes-Refilled Furosemide and Celexa   ACTIVITIES OF DAILY LIVING  Is the patient able to perform his/her own ADLs: Yes  Patient is receiving home health services: no   PATIENT EDUCATION Questions/Concerns Discussed: patient uses Toujeo at home but was given lantus while in the hospital. Reports 3 episodes of hypoglycemia. They decreased lantus dose from 65 units to 45 units because of this. Now that he is home he is using Toujeo again and blood sugar is running around 130. He is requesting a refill on pain mediation to help with chest pain. States that he was given hydrocodone in 11/2014 for this purpose and it helped. He was given 60 tablets and just ran out. States that chest pain was determined to not be cardiac related.

## 2015-06-30 NOTE — Telephone Encounter (Signed)
Pain x ready for pick up

## 2015-06-30 NOTE — Telephone Encounter (Signed)
Pt aware rx is ready for pickup.  

## 2015-07-04 ENCOUNTER — Other Ambulatory Visit: Payer: Self-pay | Admitting: Pharmacist

## 2015-07-04 ENCOUNTER — Telehealth: Payer: Self-pay | Admitting: Nurse Practitioner

## 2015-07-04 ENCOUNTER — Ambulatory Visit (INDEPENDENT_AMBULATORY_CARE_PROVIDER_SITE_OTHER): Payer: Commercial Managed Care - HMO | Admitting: Nurse Practitioner

## 2015-07-04 ENCOUNTER — Encounter: Payer: Self-pay | Admitting: Nurse Practitioner

## 2015-07-04 VITALS — BP 123/81 | HR 96 | Temp 96.5°F | Ht 72.0 in | Wt 220.0 lb

## 2015-07-04 DIAGNOSIS — R1011 Right upper quadrant pain: Secondary | ICD-10-CM | POA: Diagnosis not present

## 2015-07-04 DIAGNOSIS — I10 Essential (primary) hypertension: Secondary | ICD-10-CM | POA: Diagnosis not present

## 2015-07-04 DIAGNOSIS — E1159 Type 2 diabetes mellitus with other circulatory complications: Secondary | ICD-10-CM

## 2015-07-04 DIAGNOSIS — F418 Other specified anxiety disorders: Secondary | ICD-10-CM | POA: Diagnosis not present

## 2015-07-04 DIAGNOSIS — E785 Hyperlipidemia, unspecified: Secondary | ICD-10-CM | POA: Diagnosis not present

## 2015-07-04 LAB — POCT GLYCOSYLATED HEMOGLOBIN (HGB A1C): Hemoglobin A1C: 8.5

## 2015-07-04 MED ORDER — INSULIN DEGLUDEC 100 UNIT/ML ~~LOC~~ SOPN: 45.0000 [IU] | PEN_INJECTOR | Freq: Two times a day (BID) | SUBCUTANEOUS | Status: AC

## 2015-07-04 MED ORDER — ALPRAZOLAM 1 MG PO TABS: 1.0000 mg | ORAL_TABLET | Freq: Two times a day (BID) | ORAL | Status: DC | PRN

## 2015-07-04 NOTE — Telephone Encounter (Signed)
Patient talked to nurse °

## 2015-07-04 NOTE — Progress Notes (Signed)
   Subjective:    Patient ID: Frederick Foster, male    DOB: September 26, 1950, 64 y.o.   MRN: [id number]  HPI Patient in tiday fro transitional care visit- He was in hospital last week with chest pain- he was kept for observation- was determined that pain was not from his heart this time. Pain was maily in the epigastric area- he has had a cholecystectomy in the past so not his gall bladder- Pain occurring daily since he came home.  Pain is upper right flank and radiates all the way around. Rates pain 6/10 constant- hydrocodone helps pain. Pain no relation to foods he eats or when he eats. He is not scheduled for an further testing.    Review of Systems  Constitutional: Negative.   HENT: Negative.   Respiratory: Negative.   Cardiovascular: Negative.   Neurological: Negative.   Psychiatric/Behavioral: Negative.   All other systems reviewed and are negative.      Objective:   Physical Exam  Constitutional: He is oriented to person, place, and time. He appears well-developed and well-nourished.  Cardiovascular: Normal rate and normal heart sounds.   Pulmonary/Chest: Effort normal and breath sounds normal.  Abdominal: Soft. Bowel sounds are normal. There is tenderness (tenderness along mid upper right quadrant).  Neurological: He is alert and oriented to person, place, and time.  Skin: Skin is warm.  Psychiatric: He has a normal mood and affect. His behavior is normal. Judgment and thought content normal.    BP 123/81 mmHg  Pulse 96  Temp(Src) 96.5 F (35.8 C) (Oral)  Ht 6' (1.829 m)  Wt 220 lb (99.791 kg)  BMI 29.83 kg/m2   Results for orders placed or performed in visit on 07/04/15  POCT glycosylated hemoglobin (Hb A1C)  Result Value Ref Range   Hemoglobin A1C 8.5        Assessment & Plan:  1. Hyperlipidemia with target LDL less than 100 Low fat diet - Lipid panel  2. Essential hypertension Do not add salt to diet - CMP14+EGFR  3. Type 2 diabetes mellitus with other  circulatory complications (HCC) Continue stricter carb counting- hgba1c improving - POCT glycosylated hemoglobin (Hb A1C)  4. RUQ pain schduled [place] of RUQ  5 transitional care going to send for U/s   Labs pending Health maintenance reviewed Diet and exercise encouraged Continue all meds Follow up  In 3 months and prn   Mary-Margaret Hassell Done, FNP

## 2015-07-04 NOTE — Addendum Note (Signed)
Addended by: Chevis Pretty on: 07/04/2015 11:46 AM   Modules accepted: Orders

## 2015-07-05 LAB — LIPID PANEL
CHOL/HDL RATIO: 3.1 ratio (ref 0.0–5.0)
Cholesterol, Total: 112 mg/dL (ref 100–199)
HDL: 36 mg/dL — ABNORMAL LOW (ref >39)
LDL CALC: 56 mg/dL (ref 0–99)
TRIGLYCERIDES: 102 mg/dL (ref 0–149)
VLDL Cholesterol Cal: 20 mg/dL (ref 5–40)

## 2015-07-05 LAB — CMP14+EGFR
A/G RATIO: 1.1 (ref 1.1–2.5)
ALBUMIN: 3.3 g/dL — AB (ref 3.6–4.8)
ALT: 27 IU/L (ref 0–44)
AST: 24 IU/L (ref 0–40)
Alkaline Phosphatase: 91 IU/L (ref 39–117)
BUN / CREAT RATIO: 27 — AB (ref 10–22)
BUN: 44 mg/dL — ABNORMAL HIGH (ref 8–27)
Bilirubin Total: 0.3 mg/dL (ref 0.0–1.2)
CALCIUM: 8.5 mg/dL — AB (ref 8.6–10.2)
CO2: 22 mmol/L (ref 18–29)
CREATININE: 1.65 mg/dL — AB (ref 0.76–1.27)
Chloride: 93 mmol/L — ABNORMAL LOW (ref 96–106)
GFR, EST AFRICAN AMERICAN: 49 mL/min/{1.73_m2} — AB (ref >59)
GFR, EST NON AFRICAN AMERICAN: 43 mL/min/{1.73_m2} — AB (ref >59)
GLOBULIN, TOTAL: 3.1 g/dL (ref 1.5–4.5)
Glucose: 208 mg/dL — ABNORMAL HIGH (ref 65–99)
POTASSIUM: 4.2 mmol/L (ref 3.5–5.2)
SODIUM: 131 mmol/L — AB (ref 134–144)
TOTAL PROTEIN: 6.4 g/dL (ref 6.0–8.5)

## 2015-07-08 ENCOUNTER — Ambulatory Visit: Payer: Commercial Managed Care - HMO | Admitting: Nurse Practitioner

## 2015-07-09 ENCOUNTER — Ambulatory Visit (HOSPITAL_COMMUNITY)
Admission: RE | Admit: 2015-07-09 | Discharge: 2015-07-09 | Disposition: A | Discharge status: Home / Self Care | Payer: Commercial Managed Care - HMO | Source: Ambulatory Visit | Attending: Nurse Practitioner | Admitting: Nurse Practitioner

## 2015-07-09 DIAGNOSIS — N2889 Other specified disorders of kidney and ureter: Secondary | ICD-10-CM | POA: Insufficient documentation

## 2015-07-09 DIAGNOSIS — R1011 Right upper quadrant pain: Secondary | ICD-10-CM

## 2015-07-11 ENCOUNTER — Telehealth: Payer: Self-pay | Admitting: Nurse Practitioner

## 2015-07-11 ENCOUNTER — Other Ambulatory Visit: Payer: Self-pay | Admitting: Nurse Practitioner

## 2015-07-11 DIAGNOSIS — E278 Other specified disorders of adrenal gland: Secondary | ICD-10-CM

## 2015-07-11 NOTE — Telephone Encounter (Signed)
Patient aware of results at CT has been ordered

## 2015-07-11 NOTE — Telephone Encounter (Signed)
Please call.

## 2015-07-17 ENCOUNTER — Other Ambulatory Visit: Payer: Self-pay | Admitting: Nurse Practitioner

## 2015-07-17 ENCOUNTER — Ambulatory Visit (HOSPITAL_COMMUNITY)
Admission: RE | Admit: 2015-07-17 | Discharge: 2015-07-17 | Disposition: A | Discharge status: Home / Self Care | Payer: Commercial Managed Care - HMO | Source: Ambulatory Visit | Attending: Nurse Practitioner | Admitting: Nurse Practitioner

## 2015-07-17 DIAGNOSIS — E279 Disorder of adrenal gland, unspecified: Secondary | ICD-10-CM | POA: Diagnosis present

## 2015-07-17 DIAGNOSIS — I700 Atherosclerosis of aorta: Secondary | ICD-10-CM | POA: Diagnosis not present

## 2015-07-17 DIAGNOSIS — E278 Other specified disorders of adrenal gland: Secondary | ICD-10-CM

## 2015-07-17 MED ORDER — IOHEXOL 300 MG/ML  SOLN
80.0000 mL | Freq: Once | INTRAMUSCULAR | Status: AC | PRN
  Administered 2015-07-17: 80 mL via INTRAVENOUS

## 2015-07-18 ENCOUNTER — Telehealth: Payer: Self-pay | Admitting: Pediatrics

## 2015-07-18 DIAGNOSIS — R160 Hepatomegaly, not elsewhere classified: Secondary | ICD-10-CM

## 2015-07-18 DIAGNOSIS — E278 Other specified disorders of adrenal gland: Secondary | ICD-10-CM

## 2015-07-18 NOTE — Telephone Encounter (Signed)
Spoke with pt and his wife. 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 Pt with h/o urothelial carcinoma.s/p resection. Will refer back to urology at [facility]. Needs PET-CT scan and tissue biopsy.

## 2015-07-25 ENCOUNTER — Telehealth: Payer: Self-pay | Admitting: Nurse Practitioner

## 2015-07-25 ENCOUNTER — Other Ambulatory Visit: Payer: Self-pay | Admitting: Nurse Practitioner

## 2015-07-25 MED ORDER — MORPHINE SULFATE ER 20 MG PO CP24: 20.0000 mg | ORAL_CAPSULE | Freq: Every day | ORAL | Status: AC

## 2015-07-25 MED ORDER — HYDROCODONE-ACETAMINOPHEN 5-325 MG PO TABS: 1.0000 | ORAL_TABLET | Freq: Four times a day (QID) | ORAL | Status: AC | PRN

## 2015-07-25 NOTE — Telephone Encounter (Signed)
Last ov and labs faxed

## 2015-07-28 ENCOUNTER — Telehealth: Payer: Self-pay | Admitting: Nurse Practitioner

## 2015-07-28 MED ORDER — FENTANYL 75 MCG/HR TD PT72: 75.0000 ug | MEDICATED_PATCH | TRANSDERMAL | Status: AC

## 2015-07-28 NOTE — Telephone Encounter (Addendum)
Cut amarly in half and only take 1/2 dose daily The morphine is not working either? rx ready for pick up of fentanyl patch- hope it will help with pain. Still have no appointment with anyone?

## 2015-07-28 NOTE — Telephone Encounter (Signed)
Patient states that his bs sugar is dropping down in the 40-50. Patient states that he is in constant pain even with pain medication. He still does not have an appointment to see anyone. Please advise

## 2015-07-28 NOTE — Telephone Encounter (Signed)
Patient has appointment with Rosana Hoes on 1/27.  The morphine does help when he takes it at bedtime.  Patient aware that rx for fentanyl ready to be picked up.

## 2015-07-29 ENCOUNTER — Telehealth: Payer: Self-pay | Admitting: Nurse Practitioner

## 2015-07-29 NOTE — Telephone Encounter (Signed)
Wife states this am that when she got up he was sitting on the coach and would not speak, then he started saying something ain't right. He has not taken anything this am. She states that she gave him some orange juice and breakfast. Then she checked his sugar after he ate and it was 60. Please advise.   Wife also wants to make sure she is to have him stop the morphine and hydrocodone and replace with the fentanyl

## 2015-07-29 NOTE — Telephone Encounter (Signed)
Talked with patients wife- told to hold glimiperide- continue current dose of toujeo- call if still staying low.

## 2015-08-05 ENCOUNTER — Telehealth: Payer: Self-pay | Admitting: Nurse Practitioner

## 2015-08-05 MED ORDER — METOLAZONE 2.5 MG PO TABS: ORAL_TABLET | ORAL | Status: AC

## 2015-08-05 NOTE — Telephone Encounter (Signed)
rx sent to pharamacy - discussed with patient

## 2015-08-18 ENCOUNTER — Other Ambulatory Visit: Payer: Self-pay | Admitting: Nurse Practitioner

## 2015-08-18 NOTE — Telephone Encounter (Signed)
Please call in xanax with 1 refills 

## 2015-08-18 NOTE — Telephone Encounter (Signed)
Last filled 07/12/15, last seen 07/04/15. [facility]

## 2015-08-18 NOTE — Telephone Encounter (Signed)
rx called into pharmacy

## 2015-08-29 ENCOUNTER — Other Ambulatory Visit: Payer: Self-pay | Admitting: *Deleted

## 2015-08-29 NOTE — Patient Outreach (Signed)
[facility]) Care Management  08/29/2015  Frederick Foster 1950-07-18 [id number]  Referral from [facility] tier 4: Telephone call to patient; spouse answered phone and advised that patient was terminal and is currently under care of Hospice in [facility].  Sent to care management assistant to close out case.   Sherrin Daisy, RN BSN [place] Management Coordinator [facility] Care Management  [phone]

## 2015-09-01 ENCOUNTER — Encounter: Payer: Self-pay | Admitting: *Deleted

## 2015-09-10 DEATH — deceased

## 2015-10-03 ENCOUNTER — Ambulatory Visit: Payer: Commercial Managed Care - HMO | Admitting: Nurse Practitioner

## 2016-07-15 IMAGING — CR DG CHEST 2V
3 series · 3 of 3 positions shown · non-contrast
Comparison: 11/15/2013

CLINICAL DATA: Increasing shortness of breath since recent [place]
hospitalization.

EXAM:
CHEST  2 VIEW

[view not recorded (1 of 3)]
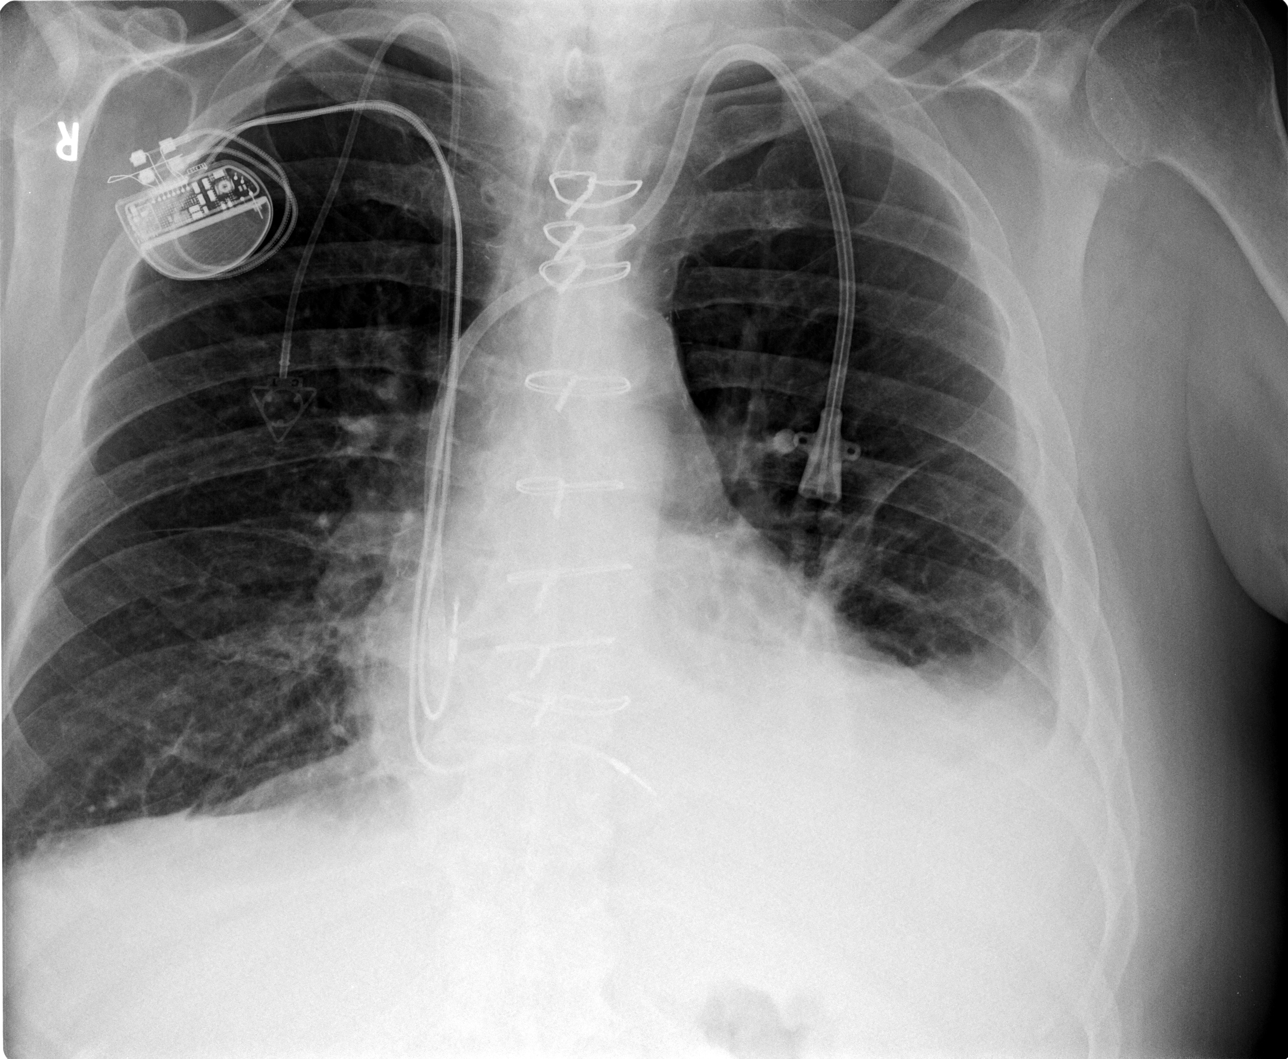

[view not recorded (2 of 3)]
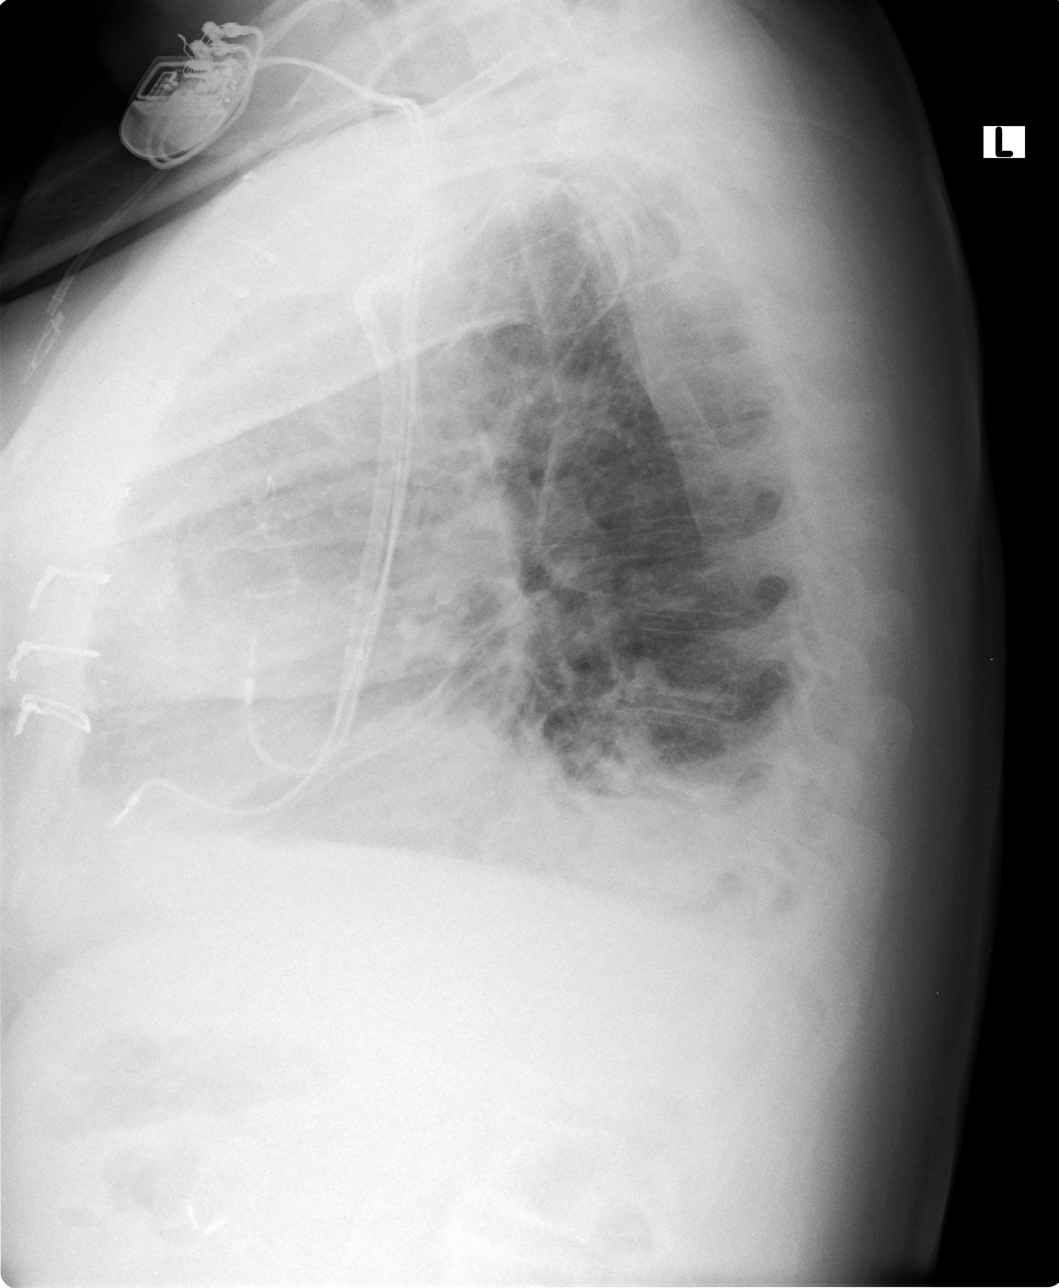

[view not recorded (3 of 3)]
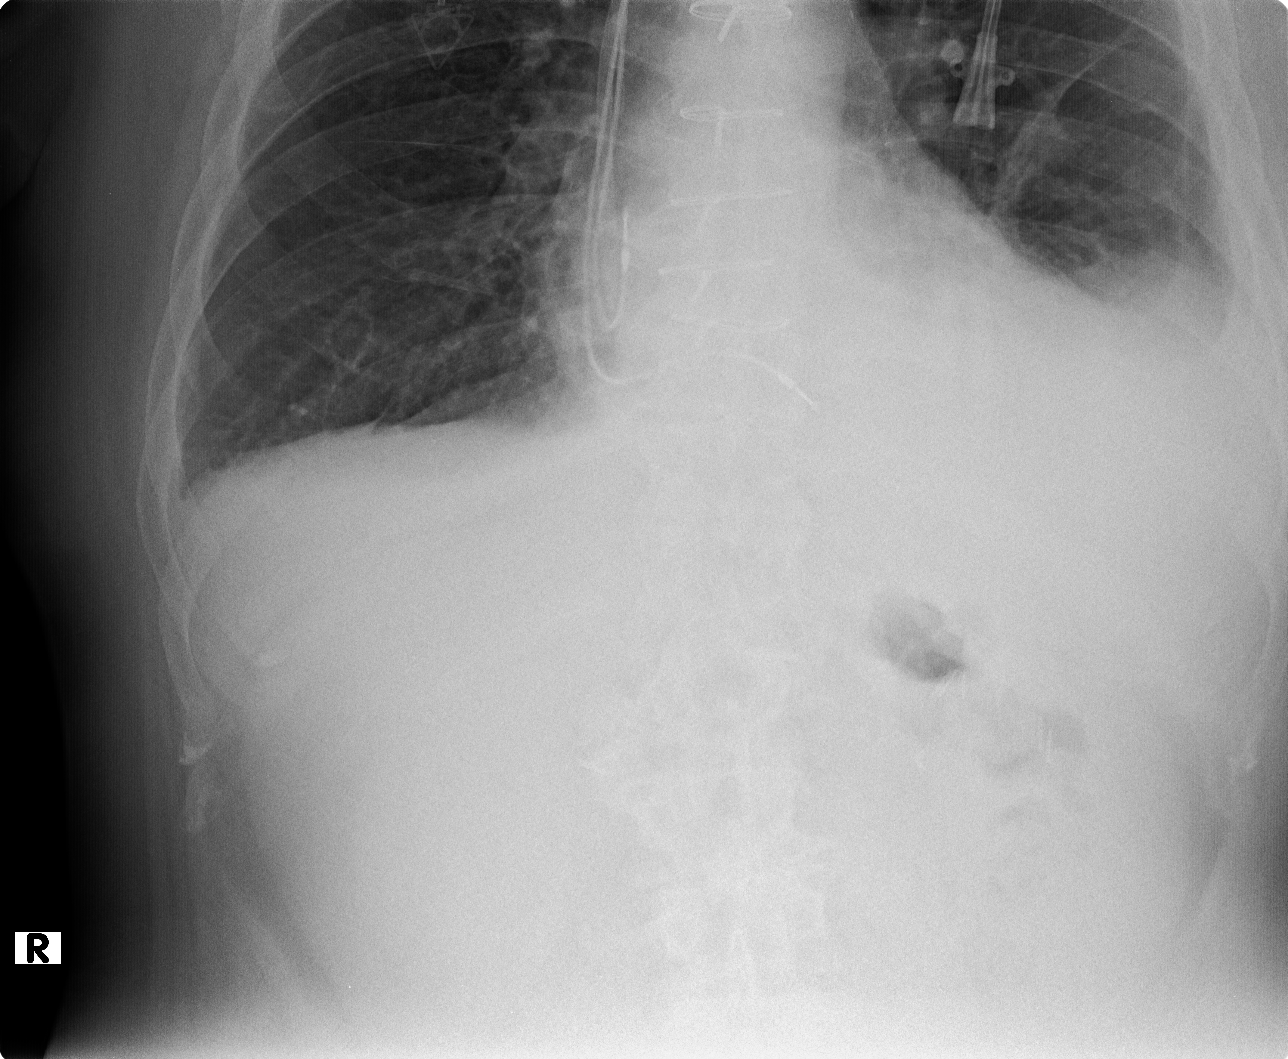

[3 of 3 positions shown; findings below may reference images not displayed]

FINDINGS: There is hyperinflation of the lungs compatible with COPD. Small to
moderate left pleural effusion with left lower lobe atelectasis or
infiltrate. No focal opacity on the right. Heart is upper limits
normal in size. Prior median sternotomy. Right dialysis catheter and
Port-A-Cath remain in place, unchanged. Left dialysis catheter tips
in the right atrium, no pneumothorax.
IMPRESSION: Small to moderate left pleural effusion with left lower lobe
atelectasis or infiltrate.

Underline COPD.

## 2017-01-18 IMAGING — DX DG CHEST 2V
2 series · 2 of 2 positions shown · non-contrast
Comparison: 12/20/2014; chest CT - 12/20/2014

CLINICAL DATA: Mid chest pain radiating to the right for 4 days.
Former smoker. Cough.

EXAM:
CHEST  2 VIEW

[chest pa]
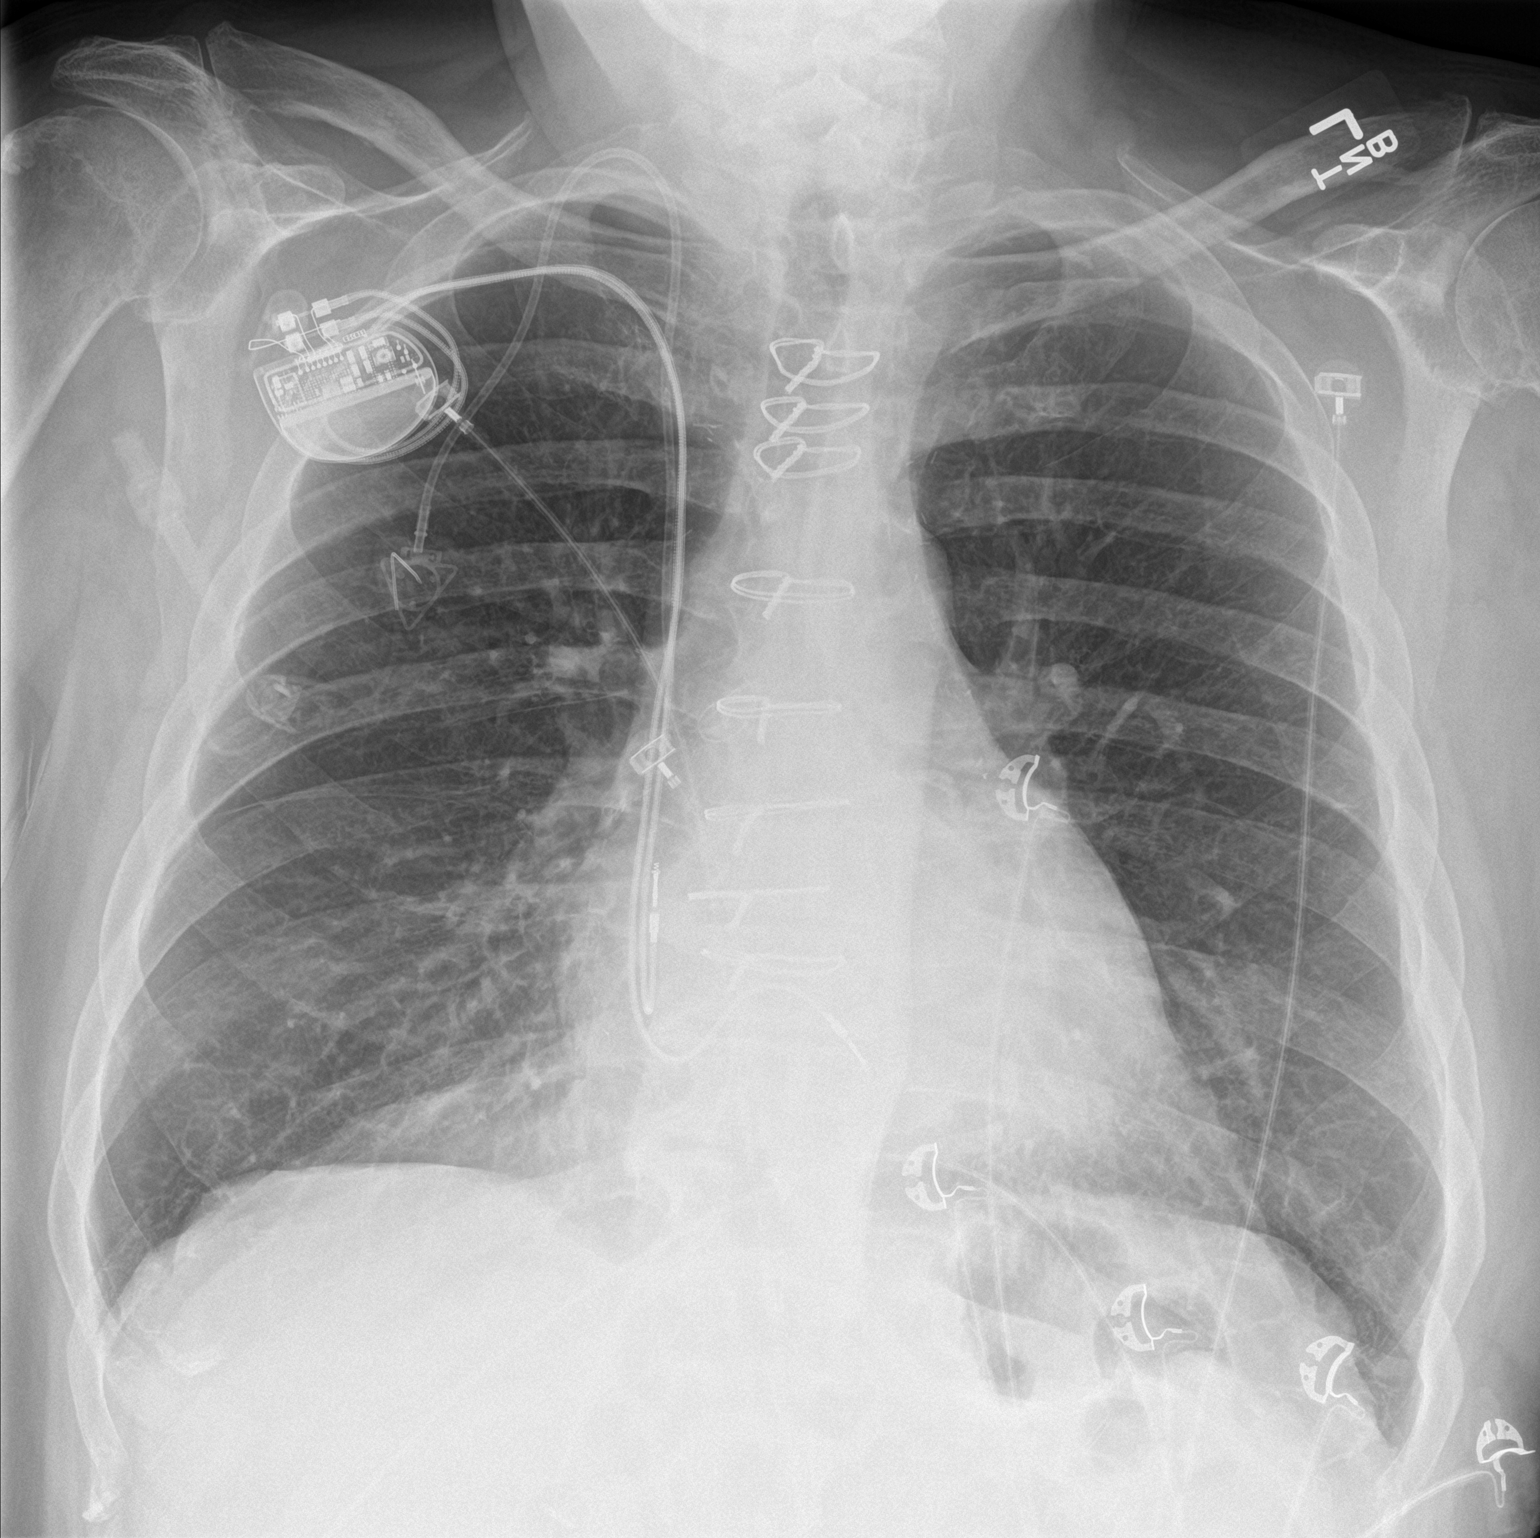

[chest lat]
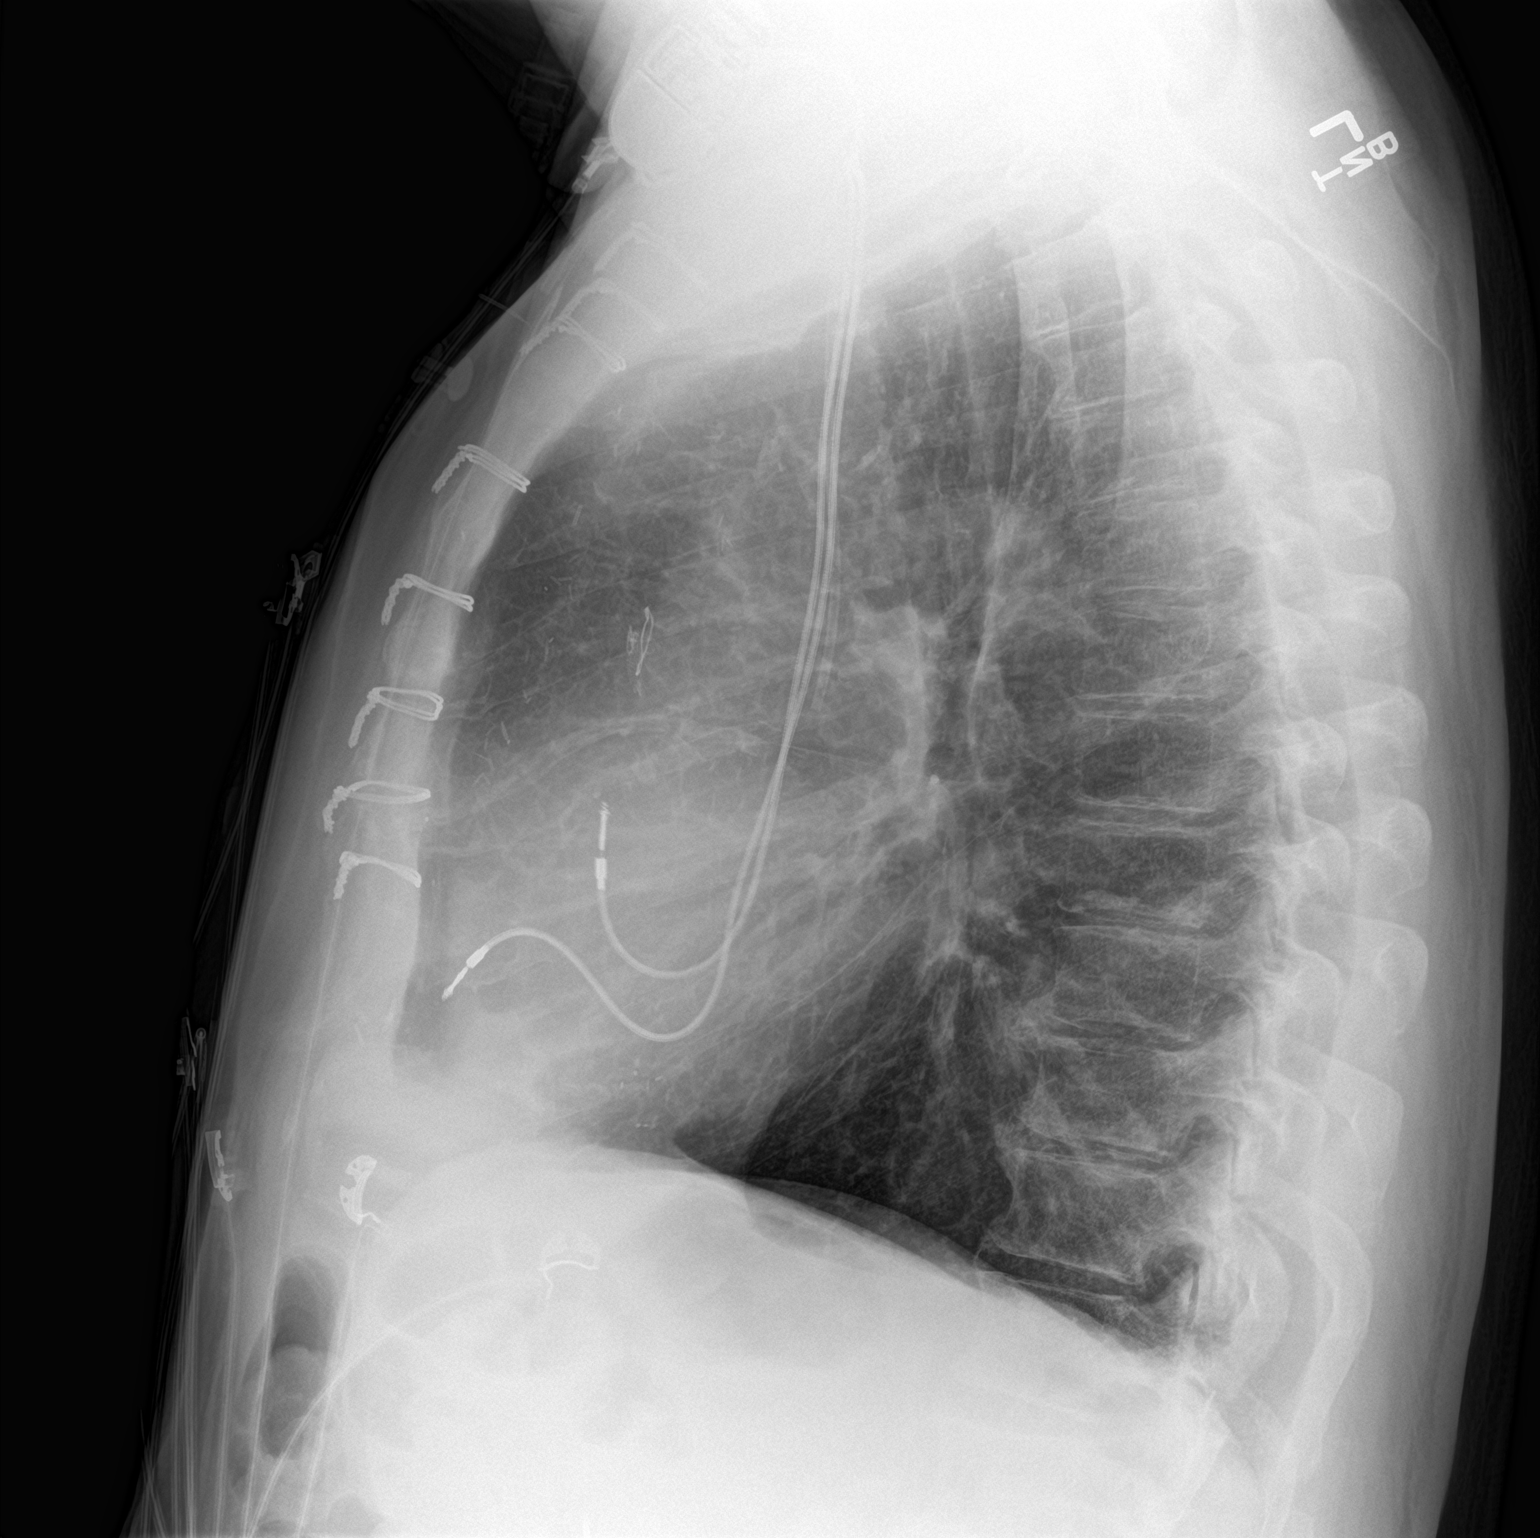

[2 of 2 positions shown; findings below may reference images not displayed]

FINDINGS: Grossly unchanged cardiac silhouette and mediastinal contours post
median sternotomy and CABG. Interval removal of left jugular
approach dialysis catheter. Otherwise, stable position of remaining
support apparatus. The lungs remain hyperexpanded. Resolved small
left-sided effusion and associated bibasilar opacities. No focal
airspace opacities. No evidence of edema or pneumothorax. No acute
osseus abnormalities.
IMPRESSION: 1. Hyperexpanded lungs without acute cardiopulmonary disease.
2. Resolved small left-sided effusion and associated left basilar
atelectasis.
